# Patient Record
Sex: Female | Born: 1935 | Race: White | Hispanic: No | Marital: Single | Smoking: Never smoker
Health system: Southern US, Community
[De-identification: ages and names within clinical notes are randomized; demographics above are authoritative.]

## PROBLEM LIST (undated history)

## (undated) DIAGNOSIS — I428 Other cardiomyopathies: Secondary | ICD-10-CM

## (undated) DIAGNOSIS — E785 Hyperlipidemia, unspecified: Secondary | ICD-10-CM

## (undated) DIAGNOSIS — I10 Essential (primary) hypertension: Secondary | ICD-10-CM

## (undated) DIAGNOSIS — E46 Unspecified protein-calorie malnutrition: Secondary | ICD-10-CM

## (undated) DIAGNOSIS — I447 Left bundle-branch block, unspecified: Secondary | ICD-10-CM

## (undated) DIAGNOSIS — E039 Hypothyroidism, unspecified: Secondary | ICD-10-CM

## (undated) DIAGNOSIS — I350 Nonrheumatic aortic (valve) stenosis: Secondary | ICD-10-CM

## (undated) DIAGNOSIS — I219 Acute myocardial infarction, unspecified: Secondary | ICD-10-CM

## (undated) HISTORY — DX: Hyperlipidemia, unspecified: E78.5

## (undated) HISTORY — DX: Hypothyroidism, unspecified: E03.9

## (undated) HISTORY — DX: Other cardiomyopathies: I42.8

## (undated) HISTORY — PX: EYE SURGERY: SHX253

## (undated) HISTORY — DX: Nonrheumatic aortic (valve) stenosis: I35.0

## (undated) HISTORY — PX: ABDOMINAL HYSTERECTOMY: SHX81

## (undated) HISTORY — DX: Left bundle-branch block, unspecified: I44.7

## (undated) HISTORY — PX: CHOLECYSTECTOMY: SHX55

---

## 2000-09-08 ENCOUNTER — Other Ambulatory Visit: Admission: RE | Admit: 2000-09-08 | Discharge: 2000-09-08 | Payer: Self-pay | Admitting: Internal Medicine

## 2000-09-09 ENCOUNTER — Encounter: Payer: Self-pay | Admitting: Internal Medicine

## 2000-09-09 ENCOUNTER — Ambulatory Visit (HOSPITAL_COMMUNITY): Admission: RE | Admit: 2000-09-09 | Discharge: 2000-09-09 | Payer: Self-pay | Admitting: Internal Medicine

## 2001-07-08 ENCOUNTER — Emergency Department (HOSPITAL_COMMUNITY): Admission: EM | Admit: 2001-07-08 | Discharge: 2001-07-08 | Payer: Self-pay | Admitting: *Deleted

## 2001-08-10 ENCOUNTER — Ambulatory Visit (HOSPITAL_COMMUNITY): Admission: RE | Admit: 2001-08-10 | Discharge: 2001-08-10 | Payer: Self-pay | Admitting: Internal Medicine

## 2001-08-10 ENCOUNTER — Emergency Department (HOSPITAL_COMMUNITY): Admission: EM | Admit: 2001-08-10 | Discharge: 2001-08-10 | Payer: Self-pay | Admitting: Emergency Medicine

## 2003-11-30 ENCOUNTER — Ambulatory Visit (HOSPITAL_COMMUNITY): Admission: RE | Admit: 2003-11-30 | Discharge: 2003-11-30 | Payer: Self-pay | Admitting: Internal Medicine

## 2005-06-13 ENCOUNTER — Inpatient Hospital Stay (HOSPITAL_COMMUNITY): Admission: EM | Admit: 2005-06-13 | Discharge: 2005-06-18 | Payer: Self-pay | Admitting: Emergency Medicine

## 2007-04-22 ENCOUNTER — Ambulatory Visit (HOSPITAL_COMMUNITY): Admission: RE | Admit: 2007-04-22 | Discharge: 2007-04-22 | Payer: Self-pay | Admitting: Family Medicine

## 2008-09-17 ENCOUNTER — Ambulatory Visit (HOSPITAL_COMMUNITY): Admission: RE | Admit: 2008-09-17 | Discharge: 2008-09-17 | Payer: Self-pay | Admitting: Family Medicine

## 2010-08-08 NOTE — Discharge Summary (Signed)
Brittany Wallace, Brittany Wallace NO.:  [id number]   MEDICAL RECORD NO.:  [id number]          PATIENT TYPE:  INP   LOCATION:  A215                          FACILITY:  [facility]   PHYSICIAN:  Corrie Mckusick, M.D.  DATE OF BIRTH:  07-25-1920   DATE OF ADMISSION:  06/13/2005  DATE OF DISCHARGE:  03/29/2007LH                                 DISCHARGE SUMMARY   DISCHARGE DIAGNOSIS:  Viral gastritis.   For history of present illness and past medical history, please see  admission H&P.   HOSPITAL COURSE:  An 77 year old female with history of hypertension,  coronary artery disease and GERD who presented with gastroenteritis. She was  admitted for IV fluids and aggressive Phenergan and Lomotil treatment for  diarrhea and nausea. She had some hypertension, and she held her atenolol  which she had on admission. She is followed by [facility] quite  closely. She sees Dr. Tresa Endo at [place]. They were consulted here in the  hospital as well. She had had some bradycardia, and she had first-degree  block. Cardiology changed some of her medications, putting her on Toprol,  more of a selective beta blocker, and started on low-dose Diovan.   A CT of the chest was also obtained due to an elevated D-dimer which was  negative for PE. Please see cardiology's notes for details. Potassium was  replaced. She remained stable from a vital standpoint on the new additional  medications. She was ready for discharge on March 29. All of her  gastrointestinal symptoms had resolved quite nicely after a day of  admission.   DISCHARGE PHYSICAL EXAMINATION:  VITAL SIGNS:  Temperature 97.4, blood  pressure 140s/70s, heart rate in the 60s and 70s, respiratory rate 20. O2  saturation 98% on room air.  GENERAL:  Pleasant female in no acute distress.  CHEST:  Clear to auscultation bilaterally.  CARDIOVASCULAR:  Regular rate and rhythm. Normal S1 and S2. No murmurs or  gallops.  ABDOMEN:  Soft,  nontender, nondistended.  EXTREMITIES:  No edema.   DISCHARGE MEDICATIONS:  1.  Diovan hydrochlorothiazide 80/12.5 daily.  2.  Lotrel 10/20 daily.  3.  Toprol-XL 50 mg daily.  4.  Digoxin 0.125 mg daily.  5.  TriCor daily.  6.  Fish oil daily.  7.  Prilosec 20 mg daily.   She is to follow up on Monday with Dr. Nobie Putnam to get stitches out on the  forehead. They will have been in there eight days at that time. I feel like  they need to stay in at least a total of seven days. She will also need a  digoxin level on that day.   DISCHARGE CONDITION:  Improved and stable.      Corrie Mckusick, M.D.  Electronically Signed     JCG/MEDQ  D:  06/18/2005  T:  06/19/2005  Job:  [id number]

## 2010-08-08 NOTE — Procedures (Signed)
NAMELEXIE, KOEHL NO.:  [id number]   MEDICAL RECORD NO.:  [id number]          PATIENT TYPE:  INP   LOCATION:  A215                          FACILITY:  [facility]   PHYSICIAN:  Dani Gobble, MD       DATE OF BIRTH:  05-Nov-1920   DATE OF PROCEDURE:  06/17/2005  DATE OF DISCHARGE:                                  ECHOCARDIOGRAM   REFERRING:  Dr. Phillips Odor and Dr. Domingo Sep.   INDICATIONS:  An 77 year old female with past medical history of  hypertension, left bundle branch block who has been experiencing shortness  of breath and dyspnea on exertion which is new in onset.   The technical quality of this study is adequate.   The aorta measures normally at 2.6 cm.   Left atrium also measures normally at 3.8 cm.  The patient appeared to be in  sinus rhythm during the procedure.   Interventricular septum and posterior wall are within normal limits in  thickness.   The aortic valve is trileaflet and mildly thickened but with normal leaflet  excursion.  Mild aortic insufficiency is appreciated.  Doppler interrogation  of the aortic valve reveals a peak velocity of 2.1 m per second  corresponding to a peak gradient of 70 mmHg and a mean gradient of 12 mmHg  suggestive of very mild aortic stenosis.   The mitral valve appears minimally thickened in the distal portion of the  anterior leaflet but without limitation of leaflet excursion.  Moderate  mitral regurgitation is appreciated as a central jet.  No mitral valve  prolapse is noted.  Doppler interrogation of the mitral valve is within  normal limits.   The pulmonic valve appears grossly structurally normal.   Tricuspid valve also appears grossly structurally normal with mild tricuspid  regurgitation noted.  Pulmonary hypertension is not suggested on this study.   The left ventricle appears to be at the upper limits of normal in size and  given her body habitus is likely mildly dilated.  Overall left ventricular  systolic function is diminished with an estimated ejection fraction of 30 to  40%.  She has multiple regional wall motion abnormalities including a thin  akinetic proximal to mid anterior septal wall.  Interestingly, the distal  anterior septal wall to include the apex thickens normally with systole.  The inferior wall is also akinetic.  Again, this does not include the apex.  The posterior wall appears to thicken normally with systole as does the  lateral wall.  The presence of diastolic dysfunction is inferred from pulse  wave Doppler across the mitral valve.   The right ventricle appears normal in size with normal right systolic  function.  The right atrium may be just mildly dilated.   She has a small posterior pericardial effusion without evidence of  hemodynamic compromise.   IMPRESSION:  1.  Very mild aortic stenosis.  2.  Mild aortic insufficiency.  3.  Moderate mitral regurgitation.  4.  Mild tricuspid regurgitation.  5.  Left ventricle is likely mildly dilated given her body habitus with  diminished ejection fraction estimated at 30 to 40%.  She has multiple      regional wall motion abnormalities as outlined above.  6.  The presence of diastolic dysfunction is inferred from pulse wave      Doppler across the mitral valve.  7.  Probably mild right atrial enlargement.  8.  Small posterior pericardial effusion without evidence of hemodynamic      compromise           ______________________________  Dani Gobble, MD     AB/MEDQ  D:  06/17/2005  T:  06/19/2005  Job:  [id number]

## 2010-08-08 NOTE — H&P (Signed)
NAMESAREN, CORKERN NO.:  [id number]   MEDICAL RECORD NO.:  [id number]          PATIENT TYPE:  INP   LOCATION:  A215                          FACILITY:  [facility]   PHYSICIAN:  Corrie Mckusick, M.D.  DATE OF BIRTH:  [birth year]   DATE OF ADMISSION:  06/13/2005  DATE OF DISCHARGE:  LH                                HISTORY & PHYSICAL   ADMISSION DIAGNOSIS:  Viral gastroenteritis.   HISTORY OF PRESENT ILLNESS:  This is an 77 year old female with history of  hypertension, coronary artery disease and GERD who presents with two to  three days of nausea and vomiting and diarrhea.  She got exposed to  individuals with gastroenteritis and presented to the emergency room not  being able to hold down food for the last seven to eight hours.  She had  watery diarrhea.  She reports no recent antibiotics, no recent travel, no  other symptomatology from a respiratory standpoint whatsoever.   PAST MEDICAL HISTORY:  1.  Hypertension.  2.  Coronary artery disease.  3.  GERD.   PAST SURGICAL HISTORY:  1.  Bilateral cataract surgery.  2.  Total abdominal hysterectomy.  3.  Cholecystectomy.   SOCIAL HISTORY:  Does not drink or smoke.   FAMILY HISTORY:  Coronary artery disease.  No other significant past medical  history.   MEDICATIONS:  1.  Lotrel 10/20 daily.  2.  Lanoxin 0.125 mg daily.  3.  Hydrochlorothiazide 25 mg daily.  4.  Tricor 145 mg daily.  5.  Prilosec 20 mg daily.  6.  Atenolol 50 mg daily.   ALLERGIES:  HYTRIN and CLONIDINE.   PHYSICAL EXAMINATION:  VITAL SIGNS: Temperature 99, blood pressure 135/65,  pulse 82, respirations 20.  GENERAL:  Pleasant, talkative, in no acute distress.  HEENT:  Nasopharynx clear with moist mucous membranes.  NECK:  Supple. No lymphadenopathy or thyromegaly.  CHEST:  Clear to auscultation bilaterally.  CARDIOVASCULAR:  Normal S1 and S2.  No murmurs, gallops or rubs.  ABDOMEN:  Bowel sounds hyperactive.  There is just some mild  mid epigastric  and periumbilical tenderness.  No rebound.  No guarding.  EXTREMITIES:  No edema.   LABORATORY DATA:  White count 8.7, hemoglobin 14.1, hematocrit 40.8,  platelets 267.  Sodium 139, potassium 4.1, chloride 104, bicarb 24, BUN 23,  creatinine 0.9, AST 43, ALT 37, digoxin 0.5, lipase 45.  Urinalysis  negative.   ASSESSMENT/PLAN:  An 77 year old female with history of hypertension,  coronary artery disease and gastroesophageal reflux disease who presented  with viral gastroenteritis.   PLAN:  1.  Admit for IV fluids.  2.  Phenergan and Lomotil for diarrhea and nausea.  3.  Hold her atenolol due to mild hypotension.  We will go ahead and      continue Lotrel for now.  4.  Will continue to follow closely.      Corrie Mckusick, M.D.  Electronically Signed     JCG/MEDQ  D:  06/14/2005  T:  06/16/2005  Job:  [id number]

## 2011-07-28 DIAGNOSIS — J019 Acute sinusitis, unspecified: Secondary | ICD-10-CM | POA: Diagnosis not present

## 2011-07-28 DIAGNOSIS — I10 Essential (primary) hypertension: Secondary | ICD-10-CM | POA: Diagnosis not present

## 2011-07-28 DIAGNOSIS — E785 Hyperlipidemia, unspecified: Secondary | ICD-10-CM | POA: Diagnosis not present

## 2011-07-28 DIAGNOSIS — IMO0002 Reserved for concepts with insufficient information to code with codable children: Secondary | ICD-10-CM | POA: Diagnosis not present

## 2011-07-28 DIAGNOSIS — R609 Edema, unspecified: Secondary | ICD-10-CM | POA: Diagnosis not present

## 2011-07-31 DIAGNOSIS — E785 Hyperlipidemia, unspecified: Secondary | ICD-10-CM | POA: Diagnosis not present

## 2011-07-31 DIAGNOSIS — I10 Essential (primary) hypertension: Secondary | ICD-10-CM | POA: Diagnosis not present

## 2011-08-12 DIAGNOSIS — H409 Unspecified glaucoma: Secondary | ICD-10-CM | POA: Diagnosis not present

## 2011-08-12 DIAGNOSIS — Z961 Presence of intraocular lens: Secondary | ICD-10-CM | POA: Diagnosis not present

## 2011-08-12 DIAGNOSIS — H4011X0 Primary open-angle glaucoma, stage unspecified: Secondary | ICD-10-CM | POA: Diagnosis not present

## 2011-10-20 DIAGNOSIS — I10 Essential (primary) hypertension: Secondary | ICD-10-CM | POA: Diagnosis not present

## 2011-10-20 DIAGNOSIS — IMO0002 Reserved for concepts with insufficient information to code with codable children: Secondary | ICD-10-CM | POA: Diagnosis not present

## 2011-10-20 DIAGNOSIS — H6690 Otitis media, unspecified, unspecified ear: Secondary | ICD-10-CM | POA: Diagnosis not present

## 2011-10-27 DIAGNOSIS — H8309 Labyrinthitis, unspecified ear: Secondary | ICD-10-CM | POA: Diagnosis not present

## 2011-10-27 DIAGNOSIS — H6500 Acute serous otitis media, unspecified ear: Secondary | ICD-10-CM | POA: Diagnosis not present

## 2011-10-27 DIAGNOSIS — IMO0002 Reserved for concepts with insufficient information to code with codable children: Secondary | ICD-10-CM | POA: Diagnosis not present

## 2011-11-04 DIAGNOSIS — J370 Chronic laryngitis: Secondary | ICD-10-CM | POA: Diagnosis not present

## 2011-11-04 DIAGNOSIS — K219 Gastro-esophageal reflux disease without esophagitis: Secondary | ICD-10-CM | POA: Diagnosis not present

## 2011-11-04 DIAGNOSIS — J309 Allergic rhinitis, unspecified: Secondary | ICD-10-CM | POA: Diagnosis not present

## 2011-11-17 DIAGNOSIS — I119 Hypertensive heart disease without heart failure: Secondary | ICD-10-CM | POA: Diagnosis not present

## 2011-11-17 DIAGNOSIS — I428 Other cardiomyopathies: Secondary | ICD-10-CM | POA: Diagnosis not present

## 2011-11-17 DIAGNOSIS — I447 Left bundle-branch block, unspecified: Secondary | ICD-10-CM | POA: Diagnosis not present

## 2011-11-18 DIAGNOSIS — J370 Chronic laryngitis: Secondary | ICD-10-CM | POA: Diagnosis not present

## 2011-11-18 DIAGNOSIS — J309 Allergic rhinitis, unspecified: Secondary | ICD-10-CM | POA: Diagnosis not present

## 2012-01-13 DIAGNOSIS — Z23 Encounter for immunization: Secondary | ICD-10-CM | POA: Diagnosis not present

## 2012-01-20 DIAGNOSIS — Z961 Presence of intraocular lens: Secondary | ICD-10-CM | POA: Diagnosis not present

## 2012-01-20 DIAGNOSIS — H4011X0 Primary open-angle glaucoma, stage unspecified: Secondary | ICD-10-CM | POA: Diagnosis not present

## 2012-01-20 DIAGNOSIS — H409 Unspecified glaucoma: Secondary | ICD-10-CM | POA: Diagnosis not present

## 2012-01-20 DIAGNOSIS — H3530 Unspecified macular degeneration: Secondary | ICD-10-CM | POA: Diagnosis not present

## 2012-02-06 DIAGNOSIS — IMO0002 Reserved for concepts with insufficient information to code with codable children: Secondary | ICD-10-CM | POA: Diagnosis not present

## 2012-02-06 DIAGNOSIS — E785 Hyperlipidemia, unspecified: Secondary | ICD-10-CM | POA: Diagnosis not present

## 2012-02-06 DIAGNOSIS — T148XXA Other injury of unspecified body region, initial encounter: Secondary | ICD-10-CM | POA: Diagnosis not present

## 2012-02-06 DIAGNOSIS — I10 Essential (primary) hypertension: Secondary | ICD-10-CM | POA: Diagnosis not present

## 2012-04-05 DIAGNOSIS — H905 Unspecified sensorineural hearing loss: Secondary | ICD-10-CM | POA: Diagnosis not present

## 2012-04-05 DIAGNOSIS — R42 Dizziness and giddiness: Secondary | ICD-10-CM | POA: Diagnosis not present

## 2012-04-05 DIAGNOSIS — R269 Unspecified abnormalities of gait and mobility: Secondary | ICD-10-CM | POA: Diagnosis not present

## 2012-04-05 DIAGNOSIS — K219 Gastro-esophageal reflux disease without esophagitis: Secondary | ICD-10-CM | POA: Diagnosis not present

## 2012-04-08 DIAGNOSIS — R269 Unspecified abnormalities of gait and mobility: Secondary | ICD-10-CM | POA: Diagnosis not present

## 2012-04-08 DIAGNOSIS — I10 Essential (primary) hypertension: Secondary | ICD-10-CM | POA: Diagnosis not present

## 2012-04-08 DIAGNOSIS — IMO0001 Reserved for inherently not codable concepts without codable children: Secondary | ICD-10-CM | POA: Diagnosis not present

## 2012-04-12 DIAGNOSIS — IMO0001 Reserved for inherently not codable concepts without codable children: Secondary | ICD-10-CM | POA: Diagnosis not present

## 2012-04-12 DIAGNOSIS — I10 Essential (primary) hypertension: Secondary | ICD-10-CM | POA: Diagnosis not present

## 2012-04-12 DIAGNOSIS — R269 Unspecified abnormalities of gait and mobility: Secondary | ICD-10-CM | POA: Diagnosis not present

## 2012-05-02 DIAGNOSIS — I10 Essential (primary) hypertension: Secondary | ICD-10-CM | POA: Diagnosis not present

## 2012-05-02 DIAGNOSIS — IMO0001 Reserved for inherently not codable concepts without codable children: Secondary | ICD-10-CM | POA: Diagnosis not present

## 2012-05-02 DIAGNOSIS — R269 Unspecified abnormalities of gait and mobility: Secondary | ICD-10-CM | POA: Diagnosis not present

## 2012-05-18 DIAGNOSIS — I10 Essential (primary) hypertension: Secondary | ICD-10-CM | POA: Diagnosis not present

## 2012-05-18 DIAGNOSIS — IMO0001 Reserved for inherently not codable concepts without codable children: Secondary | ICD-10-CM | POA: Diagnosis not present

## 2012-07-05 DIAGNOSIS — R42 Dizziness and giddiness: Secondary | ICD-10-CM | POA: Diagnosis not present

## 2012-07-05 DIAGNOSIS — I447 Left bundle-branch block, unspecified: Secondary | ICD-10-CM | POA: Diagnosis not present

## 2012-07-05 DIAGNOSIS — I359 Nonrheumatic aortic valve disorder, unspecified: Secondary | ICD-10-CM | POA: Diagnosis not present

## 2012-07-05 DIAGNOSIS — I428 Other cardiomyopathies: Secondary | ICD-10-CM | POA: Diagnosis not present

## 2012-07-06 DIAGNOSIS — T462X1A Poisoning by other antidysrhythmic drugs, accidental (unintentional), initial encounter: Secondary | ICD-10-CM | POA: Diagnosis not present

## 2012-07-06 DIAGNOSIS — E782 Mixed hyperlipidemia: Secondary | ICD-10-CM | POA: Diagnosis not present

## 2012-07-06 DIAGNOSIS — Z79899 Other long term (current) drug therapy: Secondary | ICD-10-CM | POA: Diagnosis not present

## 2012-07-07 ENCOUNTER — Encounter (HOSPITAL_COMMUNITY): Payer: Self-pay | Admitting: *Deleted

## 2012-07-07 ENCOUNTER — Emergency Department (HOSPITAL_COMMUNITY)
Admission: EM | Admit: 2012-07-07 | Discharge: 2012-07-07 | Disposition: A | Discharge status: Home / Self Care | Payer: Medicare Other | Attending: Emergency Medicine | Admitting: Emergency Medicine

## 2012-07-07 ENCOUNTER — Emergency Department (HOSPITAL_COMMUNITY): Payer: Medicare Other

## 2012-07-07 ENCOUNTER — Other Ambulatory Visit (HOSPITAL_COMMUNITY): Payer: Self-pay | Admitting: Cardiovascular Disease

## 2012-07-07 DIAGNOSIS — I255 Ischemic cardiomyopathy: Secondary | ICD-10-CM

## 2012-07-07 DIAGNOSIS — Z79899 Other long term (current) drug therapy: Secondary | ICD-10-CM | POA: Diagnosis not present

## 2012-07-07 DIAGNOSIS — R0602 Shortness of breath: Secondary | ICD-10-CM | POA: Diagnosis not present

## 2012-07-07 DIAGNOSIS — J209 Acute bronchitis, unspecified: Secondary | ICD-10-CM | POA: Insufficient documentation

## 2012-07-07 DIAGNOSIS — I252 Old myocardial infarction: Secondary | ICD-10-CM | POA: Diagnosis not present

## 2012-07-07 DIAGNOSIS — R5381 Other malaise: Secondary | ICD-10-CM | POA: Diagnosis not present

## 2012-07-07 DIAGNOSIS — R112 Nausea with vomiting, unspecified: Secondary | ICD-10-CM | POA: Insufficient documentation

## 2012-07-07 DIAGNOSIS — I447 Left bundle-branch block, unspecified: Secondary | ICD-10-CM | POA: Diagnosis not present

## 2012-07-07 DIAGNOSIS — R05 Cough: Secondary | ICD-10-CM | POA: Insufficient documentation

## 2012-07-07 DIAGNOSIS — J40 Bronchitis, not specified as acute or chronic: Secondary | ICD-10-CM

## 2012-07-07 DIAGNOSIS — R5383 Other fatigue: Secondary | ICD-10-CM | POA: Insufficient documentation

## 2012-07-07 DIAGNOSIS — R42 Dizziness and giddiness: Secondary | ICD-10-CM | POA: Diagnosis not present

## 2012-07-07 DIAGNOSIS — R011 Cardiac murmur, unspecified: Secondary | ICD-10-CM

## 2012-07-07 DIAGNOSIS — R350 Frequency of micturition: Secondary | ICD-10-CM | POA: Insufficient documentation

## 2012-07-07 DIAGNOSIS — R059 Cough, unspecified: Secondary | ICD-10-CM | POA: Insufficient documentation

## 2012-07-07 DIAGNOSIS — I10 Essential (primary) hypertension: Secondary | ICD-10-CM | POA: Insufficient documentation

## 2012-07-07 DIAGNOSIS — I350 Nonrheumatic aortic (valve) stenosis: Secondary | ICD-10-CM

## 2012-07-07 DIAGNOSIS — R531 Weakness: Secondary | ICD-10-CM

## 2012-07-07 HISTORY — DX: Essential (primary) hypertension: I10

## 2012-07-07 HISTORY — DX: Acute myocardial infarction, unspecified: I21.9

## 2012-07-07 LAB — URINALYSIS, ROUTINE W REFLEX MICROSCOPIC
Bilirubin Urine: NEGATIVE
Glucose, UA: NEGATIVE mg/dL
Ketones, ur: NEGATIVE mg/dL
Leukocytes, UA: NEGATIVE
Nitrite: NEGATIVE
Specific Gravity, Urine: <1.005 — ABNORMAL LOW (ref 1.005–1.030)
pH: 7 (ref 5.0–8.0)

## 2012-07-07 LAB — COMPREHENSIVE METABOLIC PANEL
Albumin: 4.2 g/dL (ref 3.5–5.2)
Alkaline Phosphatase: 48 U/L (ref 39–117)
BUN: 20 mg/dL (ref 6–23)
CO2: 23 mEq/L (ref 19–32)
Chloride: 96 mEq/L (ref 96–112)
Creatinine, Ser: 0.67 mg/dL (ref 0.50–1.10)
GFR calc Af Amer: 87 mL/min — ABNORMAL LOW (ref >90)
GFR calc non Af Amer: 75 mL/min — ABNORMAL LOW (ref >90)
Glucose, Bld: 109 mg/dL — ABNORMAL HIGH (ref 70–99)
Potassium: 3.9 mEq/L (ref 3.5–5.1)
Total Bilirubin: 0.6 mg/dL (ref 0.3–1.2)

## 2012-07-07 LAB — CBC WITH DIFFERENTIAL/PLATELET
HCT: 36 % (ref 36.0–46.0)
Hemoglobin: 13 g/dL (ref 12.0–15.0)
Lymphs Abs: 1.4 10*3/uL (ref 0.7–4.0)
Monocytes Absolute: 0.4 10*3/uL (ref 0.1–1.0)
Monocytes Relative: 9 % (ref 3–12)
Neutro Abs: 2.4 10*3/uL (ref 1.7–7.7)
Neutrophils Relative %: 51 % (ref 43–77)
RBC: 4.08 MIL/uL (ref 3.87–5.11)

## 2012-07-07 LAB — DIGOXIN LEVEL: Digoxin Level: 0.4 ng/mL — ABNORMAL LOW (ref 0.8–2.0)

## 2012-07-07 MED ORDER — LEVOFLOXACIN 500 MG PO TABS: 500.0000 mg | ORAL_TABLET | Freq: Every day | ORAL | Status: DC

## 2012-07-07 NOTE — ED Notes (Signed)
Dizziness, onset on Monday, Seen by Dr Tresa Endo on  Tuesday.  Frequency of urination.  Changed her atenolol to 1/2 her usual dose.  No pain.

## 2012-07-07 NOTE — ED Notes (Signed)
100 CC of urine showed up on Bladder Scanner.

## 2012-07-07 NOTE — ED Provider Notes (Signed)
History     CSN: [id number]  Arrival date & time 07/07/12  1733   First MD Initiated Contact with Patient 07/07/12 1919      Chief Complaint  Patient presents with  . Dizziness    (Consider location/radiation/quality/duration/timing/severity/associated sxs/prior treatment) HPI  Patient reports 3 days ago she started having a feeling that she was going to pass out when she stood up while working. She states she laid down for about half hour and feels better. She has frequency without dysuria. She states while lying in bed the urine just seeps out without control. She has had mild nausea without vomiting or diarrhea. She denies constipation. She states she's had a hacking cough for 2-3 weeks without fever. She states her appetite is normal. She denies any diaphoresis but states sometimes with these episodes she feels short of breath but she denies chest pain. She denies headache or a feeling of spinning. She states she also had an episode of this about 2 weeks ago. She states she was seen by her cardiologist, Dr. Tresa Endo 2 days ago and he decreased her Tenormin by half. She is scheduled to get a outpatient echo done soon. She reports she has felt like she may be dehydrated and has been drinking a lot of extra water. She reports about 15 minutes after she drinks water she has to urinate.  PCP Dr. Regino Schultze Cardiology Dr Tresa Endo  Past Medical History  Diagnosis Date  . Hypertension   . Myocardial infarct     Past Surgical History  Procedure Laterality Date  . Abdominal hysterectomy    . Cholecystectomy    . Eye surgery      History reviewed. No pertinent family history.  History  Substance Use Topics  . Smoking status: Never Smoker   . Smokeless tobacco: Not on file  . Alcohol Use: No  lives at home Lives alone  [place] History   Grav Para Term Preterm Abortions TAB SAB Ect Mult Living                  Review of Systems  All other systems reviewed and are  negative.    Allergies  Review of patient's allergies indicates no known allergies.  Home Medications   Current Outpatient Rx  Name  Route  Sig  Dispense  Refill  . amLODipine-benazepril (LOTREL) 10-40 MG per capsule   Oral   Take 1 capsule by mouth daily.         Marland Kitchen aspirin EC 81 MG tablet   Oral   Take 81 mg by mouth daily.         Marland Kitchen atenolol (TENORMIN) 25 MG tablet   Oral   Take 12.5 mg by mouth daily.         . Calcium Citrate-Vitamin D (CITRACAL + D PO)   Oral   Take 1 tablet by mouth daily.         . chlordiazePOXIDE (LIBRIUM) 10 MG capsule   Oral   Take 10 mg by mouth 3 (three) times daily as needed for anxiety.         . Choline Fenofibrate 135 MG capsule   Oral   Take 135 mg by mouth daily.         . digoxin (LANOXIN) 0.125 MG tablet   Oral   Take 0.25 mg by mouth daily.         . diphenoxylate-atropine (LOMOTIL) 2.5-0.025 MG per tablet   Oral   Take 1 tablet  by mouth 4 (four) times daily as needed for diarrhea or loose stools.         . hydrochlorothiazide (HYDRODIURIL) 25 MG tablet   Oral   Take 12.5 mg by mouth daily.         . Lutein 20 MG TABS   Oral   Take 1 tablet by mouth daily.         . montelukast (SINGULAIR) 10 MG tablet   Oral   Take 10 mg by mouth at bedtime.         . Multiple Vitamins-Minerals (CENTRUM SILVER) tablet   Oral   Take 1 tablet by mouth daily.         Marland Kitchen omeprazole (PRILOSEC) 20 MG capsule   Oral   Take 20 mg by mouth daily.         . potassium chloride (KLOR-CON) 8 MEQ tablet   Oral   Take 16 mEq by mouth daily.         . timolol (TIMOPTIC) 0.5 % ophthalmic solution   Both Eyes   Place 1 drop into both eyes 2 (two) times daily.           BP 184/70  Pulse 75  Temp(Src) 98 F (36.7 C) (Oral)  Resp 20  Ht 5\' 4"  (1.626 m)  Wt 115 lb (52.164 kg)  BMI 19.73 kg/m2  SpO2 99%  Vital signs normal except hypertension   Physical Exam  Nursing note and vitals  reviewed. Constitutional: She is oriented to person, place, and time. She appears well-developed and well-nourished.  Non-toxic appearance. She does not appear ill. No distress.  HENT:  Head: Normocephalic and atraumatic.  Right Ear: External ear normal.  Left Ear: External ear normal.  Nose: Nose normal. No mucosal edema or rhinorrhea.  Mouth/Throat: Oropharynx is clear and moist and mucous membranes are normal. No dental abscesses or edematous.  Eyes: Conjunctivae and EOM are normal.  Left pupil is abnormal and is vertically oriented from prior eye surgery  Neck: Normal range of motion and full passive range of motion without pain. Neck supple.  Cardiovascular: Normal rate, regular rhythm and normal heart sounds.  Exam reveals no gallop and no friction rub.   No murmur heard. Pulmonary/Chest: Effort normal and breath sounds normal. No respiratory distress. She has no wheezes. She has no rhonchi. She has no rales. She exhibits no tenderness and no crepitus.  Abdominal: Soft. Normal appearance and bowel sounds are normal. She exhibits no distension. There is no tenderness. There is no rebound and no guarding.  Musculoskeletal: Normal range of motion. She exhibits no edema and no tenderness.  Moves all extremities well.   Neurological: She is alert and oriented to person, place, and time. She has normal strength. No cranial nerve deficit.  Skin: Skin is warm, dry and intact. No rash noted. No erythema. No pallor.  Psychiatric: She has a normal mood and affect. Her speech is normal and behavior is normal. Her mood appears not anxious.    ED Course  Procedures (including critical care time)  Patient ambulated to the bathroom with minimal assistance 2-3 times while waiting for her laboratory results to return.  Bladder scan shows 100 cc of urine after voiding, no urinary retention present.   We discussed that she should either barrow or buy  a blood pressure monitor and when she has the  episodes when she feels like she's going to pass out she should check her blood pressure and write it  down so she can show her physicians if her blood pressure is going up too high or dropping too low when she feels near syncopal.    Results for orders placed during the hospital encounter of 07/07/12  URINALYSIS, ROUTINE W REFLEX MICROSCOPIC      Result Value Range   Color, Urine YELLOW  YELLOW   APPearance CLEAR  CLEAR   Specific Gravity, Urine <1.005 (*) 1.005 - 1.030   pH 7.0  5.0 - 8.0   Glucose, UA NEGATIVE  NEGATIVE mg/dL   Hgb urine dipstick NEGATIVE  NEGATIVE   Bilirubin Urine NEGATIVE  NEGATIVE   Ketones, ur NEGATIVE  NEGATIVE mg/dL   Protein, ur NEGATIVE  NEGATIVE mg/dL   Urobilinogen, UA 0.2  0.0 - 1.0 mg/dL   Nitrite NEGATIVE  NEGATIVE   Leukocytes, UA NEGATIVE  NEGATIVE  CBC WITH DIFFERENTIAL      Result Value Range   WBC 4.7  4.0 - 10.5 K/uL   RBC 4.08  3.87 - 5.11 MIL/uL   Hemoglobin 13.0  12.0 - 15.0 g/dL   HCT 11.9  14.7 - 82.9 %   MCV 88.2  78.0 - 100.0 fL   MCH 31.9  26.0 - 34.0 pg   MCHC 36.1 (*) 30.0 - 36.0 g/dL   RDW 56.2  13.0 - 86.5 %   Platelets 241  150 - 400 K/uL   Neutrophils Relative 51  43 - 77 %   Neutro Abs 2.4  1.7 - 7.7 K/uL   Lymphocytes Relative 31  12 - 46 %   Lymphs Abs 1.4  0.7 - 4.0 K/uL   Monocytes Relative 9  3 - 12 %   Monocytes Absolute 0.4  0.1 - 1.0 K/uL   Eosinophils Relative 8 (*) 0 - 5 %   Eosinophils Absolute 0.4  0.0 - 0.7 K/uL   Basophils Relative 1  0 - 1 %   Basophils Absolute 0.0  0.0 - 0.1 K/uL  COMPREHENSIVE METABOLIC PANEL      Result Value Range   Sodium 131 (*) 135 - 145 mEq/L   Potassium 3.9  3.5 - 5.1 mEq/L   Chloride 96  96 - 112 mEq/L   CO2 23  19 - 32 mEq/L   Glucose, Bld 109 (*) 70 - 99 mg/dL   BUN 20  6 - 23 mg/dL   Creatinine, Ser 7.84  0.50 - 1.10 mg/dL   Calcium 9.7  8.4 - 69.6 mg/dL   Total Protein 7.8  6.0 - 8.3 g/dL   Albumin 4.2  3.5 - 5.2 g/dL   AST 21  0 - 37 U/L   ALT 20  0 - 35 U/L    Alkaline Phosphatase 48  39 - 117 U/L   Total Bilirubin 0.6  0.3 - 1.2 mg/dL   GFR calc non Af Amer 75 (*) >90 mL/min   GFR calc Af Amer 87 (*) >90 mL/min  DIGOXIN LEVEL      Result Value Range   Digoxin Level 0.4 (*) 0.8 - 2.0 ng/mL   Vital signs normal except subtherapeutic Digoxin level, hyponatremia, dilute urine.    Dg Chest Portable 1 View  07/07/2012  *RADIOLOGY REPORT*  Clinical Data: Cough and shortness of breath.  PORTABLE CHEST - 1 VIEW  Comparison: Chest radiograph performed 04/22/2007  Findings: The lungs are well-aerated.  Chronically increased interstitial markings are seen.  Minimal right basilar airspace opacity could reflect atelectasis or mild pneumonia.  No pleural effusion or pneumothorax  is seen.  The cardiomediastinal silhouette is mildly enlarged.  There is stable aneurysmal dilatation at the proximal descending thoracic aorta.  This was seen to measure 4.1 cm on the prior CT from 2007, and is stable from the prior chest radiograph in 2007.  No acute osseous abnormalities are seen.  IMPRESSION:  1.  Minimal right basilar airspace opacity could reflect atelectasis or mild pneumonia.  Mild chronic lung changes seen. 2.  Mild cardiomegaly; stable aneurysmal dilatation of the proximal descending thoracic aorta, unchanged from 2007.   Original Report Authenticated By: Tonia Ghent, M.D.      Date: 07/07/2012  Rate: 67  Rhythm: normal sinus rhythm  QRS Axis: left  Intervals: normal  ST/T Wave abnormalities: normal  Conduction Disutrbances:left bundle branch block  Narrative Interpretation:   Old EKG Reviewed: none available    1. Weakness   2. Bronchitis     New Prescriptions   LEVOFLOXACIN (LEVAQUIN) 500 MG TABLET    Take 1 tablet (500 mg total) by mouth daily.    Plan discharge  Devoria Albe, MD, FACEP   MDM          Ward Givens, MD 07/07/12 2226

## 2012-07-19 DIAGNOSIS — E785 Hyperlipidemia, unspecified: Secondary | ICD-10-CM | POA: Diagnosis not present

## 2012-07-19 DIAGNOSIS — I10 Essential (primary) hypertension: Secondary | ICD-10-CM | POA: Diagnosis not present

## 2012-07-19 DIAGNOSIS — R5383 Other fatigue: Secondary | ICD-10-CM | POA: Diagnosis not present

## 2012-07-19 DIAGNOSIS — R5381 Other malaise: Secondary | ICD-10-CM | POA: Diagnosis not present

## 2012-07-19 DIAGNOSIS — I2510 Atherosclerotic heart disease of native coronary artery without angina pectoris: Secondary | ICD-10-CM | POA: Diagnosis not present

## 2012-07-20 ENCOUNTER — Ambulatory Visit (HOSPITAL_COMMUNITY)
Admission: RE | Admit: 2012-07-20 | Discharge: 2012-07-20 | Disposition: A | Discharge status: Home / Self Care | Payer: Medicare Other | Source: Ambulatory Visit | Attending: Cardiovascular Disease | Admitting: Cardiovascular Disease

## 2012-07-20 DIAGNOSIS — I2589 Other forms of chronic ischemic heart disease: Secondary | ICD-10-CM | POA: Diagnosis not present

## 2012-07-20 DIAGNOSIS — I359 Nonrheumatic aortic valve disorder, unspecified: Secondary | ICD-10-CM | POA: Diagnosis not present

## 2012-07-20 DIAGNOSIS — I428 Other cardiomyopathies: Secondary | ICD-10-CM | POA: Diagnosis not present

## 2012-07-20 DIAGNOSIS — R011 Cardiac murmur, unspecified: Secondary | ICD-10-CM | POA: Diagnosis not present

## 2012-07-20 DIAGNOSIS — I255 Ischemic cardiomyopathy: Secondary | ICD-10-CM

## 2012-07-20 DIAGNOSIS — I350 Nonrheumatic aortic (valve) stenosis: Secondary | ICD-10-CM

## 2012-07-20 NOTE — Progress Notes (Signed)
[facility]   2D echo completed 07/20/2012.   Cindy Marlicia Sroka, RDCS  

## 2012-07-21 ENCOUNTER — Ambulatory Visit (HOSPITAL_COMMUNITY): Payer: Self-pay

## 2012-07-22 DIAGNOSIS — H3530 Unspecified macular degeneration: Secondary | ICD-10-CM | POA: Diagnosis not present

## 2012-07-22 DIAGNOSIS — H4011X0 Primary open-angle glaucoma, stage unspecified: Secondary | ICD-10-CM | POA: Diagnosis not present

## 2012-07-22 DIAGNOSIS — Z961 Presence of intraocular lens: Secondary | ICD-10-CM | POA: Diagnosis not present

## 2012-08-18 ENCOUNTER — Other Ambulatory Visit: Payer: Self-pay | Admitting: *Deleted

## 2012-08-18 MED ORDER — POTASSIUM CHLORIDE ER 8 MEQ PO TBCR: 16.0000 meq | EXTENDED_RELEASE_TABLET | Freq: Every day | ORAL | Status: DC

## 2012-09-16 ENCOUNTER — Other Ambulatory Visit: Payer: Self-pay | Admitting: *Deleted

## 2012-09-16 MED ORDER — AMLODIPINE BESY-BENAZEPRIL HCL 10-40 MG PO CAPS: 1.0000 | ORAL_CAPSULE | Freq: Every day | ORAL | Status: DC

## 2012-11-08 DIAGNOSIS — Z681 Body mass index (BMI) 19 or less, adult: Secondary | ICD-10-CM | POA: Diagnosis not present

## 2012-11-08 DIAGNOSIS — H8309 Labyrinthitis, unspecified ear: Secondary | ICD-10-CM | POA: Diagnosis not present

## 2012-11-08 DIAGNOSIS — I10 Essential (primary) hypertension: Secondary | ICD-10-CM | POA: Diagnosis not present

## 2012-11-16 ENCOUNTER — Other Ambulatory Visit: Payer: Self-pay | Admitting: *Deleted

## 2012-11-16 MED ORDER — DIGOXIN 125 MCG PO TABS: 0.0625 mg | ORAL_TABLET | Freq: Every day | ORAL | Status: DC

## 2012-11-16 NOTE — Telephone Encounter (Signed)
Rx was sent to pharmacy electronically. 

## 2012-11-23 DIAGNOSIS — H93299 Other abnormal auditory perceptions, unspecified ear: Secondary | ICD-10-CM | POA: Diagnosis not present

## 2012-11-23 DIAGNOSIS — H8110 Benign paroxysmal vertigo, unspecified ear: Secondary | ICD-10-CM | POA: Diagnosis not present

## 2012-11-23 DIAGNOSIS — J370 Chronic laryngitis: Secondary | ICD-10-CM | POA: Diagnosis not present

## 2012-12-15 ENCOUNTER — Other Ambulatory Visit: Payer: Self-pay | Admitting: *Deleted

## 2012-12-15 MED ORDER — CHOLINE FENOFIBRATE 135 MG PO CPDR: 135.0000 mg | DELAYED_RELEASE_CAPSULE | Freq: Every day | ORAL | Status: DC

## 2012-12-15 NOTE — Telephone Encounter (Signed)
Rx was sent to pharmacy electronically. 

## 2012-12-16 ENCOUNTER — Other Ambulatory Visit: Payer: Self-pay | Admitting: *Deleted

## 2012-12-16 MED ORDER — HYDROCHLOROTHIAZIDE 25 MG PO TABS: 12.5000 mg | ORAL_TABLET | Freq: Every day | ORAL | Status: DC

## 2012-12-16 NOTE — Telephone Encounter (Signed)
Rx was sent to pharmacy electronically. 

## 2012-12-27 DIAGNOSIS — K219 Gastro-esophageal reflux disease without esophagitis: Secondary | ICD-10-CM | POA: Diagnosis not present

## 2012-12-27 DIAGNOSIS — J309 Allergic rhinitis, unspecified: Secondary | ICD-10-CM | POA: Diagnosis not present

## 2012-12-27 DIAGNOSIS — J370 Chronic laryngitis: Secondary | ICD-10-CM | POA: Diagnosis not present

## 2012-12-28 ENCOUNTER — Encounter: Payer: Self-pay | Admitting: Cardiovascular Disease

## 2012-12-28 ENCOUNTER — Ambulatory Visit (INDEPENDENT_AMBULATORY_CARE_PROVIDER_SITE_OTHER): Payer: Medicare Other | Admitting: Cardiovascular Disease

## 2012-12-28 VITALS — BP 164/70 | HR 70 | Ht 64.0 in | Wt 120.3 lb

## 2012-12-28 DIAGNOSIS — I447 Left bundle-branch block, unspecified: Secondary | ICD-10-CM

## 2012-12-28 DIAGNOSIS — E785 Hyperlipidemia, unspecified: Secondary | ICD-10-CM | POA: Diagnosis not present

## 2012-12-28 DIAGNOSIS — I10 Essential (primary) hypertension: Secondary | ICD-10-CM | POA: Diagnosis not present

## 2012-12-28 DIAGNOSIS — I2510 Atherosclerotic heart disease of native coronary artery without angina pectoris: Secondary | ICD-10-CM

## 2012-12-28 DIAGNOSIS — I428 Other cardiomyopathies: Secondary | ICD-10-CM | POA: Diagnosis not present

## 2012-12-28 MED ORDER — ATENOLOL 25 MG PO TABS: 12.5000 mg | ORAL_TABLET | Freq: Two times a day (BID) | ORAL | Status: DC

## 2012-12-28 NOTE — Progress Notes (Signed)
Patient ID: Brittany Wallace, female   DOB: [birth year], 79 y.o.   MRN: [id number]       HPI: Brittany Wallace, is a 79 y.o. female who presents to the office for a six-month cardiology evaluation.  Brittany Wallace is now 79 years old. She remains active and still cuts her grass. She has a remote history of a nonischemic cardiac myopathy and in 2001 her ejection fraction was 30%. This subsequently has increased. On 07/20/2012 echo Doppler study showed an ejection fraction now in the range of 40-45%. She had grade 1 diastolic dysfunction. She had mild/moderate pulmonary hypertension with a PA pressure of 48 mm, moderate tricuspid regurgitation, moderate mitral regurgitation with mitral annular calcification. She also has a history of left bundle branch block, hypertension, as well as hyperlipidemia.  Over the past 6 months, she has noticed some slight increase in palpitations. Times her blood pressure has been elevated. She presents for evaluation.  Past Medical History  Diagnosis Date  . Hypertension   . Myocardial infarct     Past Surgical History  Procedure Laterality Date  . Abdominal hysterectomy    . Cholecystectomy    . Eye surgery      Allergies  Allergen Reactions  . Aspirin     High doses.  [place] [Levofloxacin In D5w]     Fuzzy headed  . Pantoprazole     Current Outpatient Prescriptions  Medication Sig Dispense Refill  . amLODipine-benazepril (LOTREL) 10-40 MG per capsule Take 1 capsule by mouth daily.  30 capsule  11  . atenolol (TENORMIN) 25 MG tablet Take 0.5 tablets (12.5 mg total) by mouth 2 (two) times daily.  60 tablet  5  . Calcium Citrate-Vitamin D (CITRACAL + D PO) Take 1 tablet by mouth daily.      . chlordiazePOXIDE (LIBRIUM) 10 MG capsule Take 10 mg by mouth 3 (three) times daily as needed for anxiety.      . Choline Fenofibrate 135 MG capsule Take 1 capsule (135 mg total) by mouth daily.  30 capsule  7  . digoxin (LANOXIN) 0.125 MG tablet Take 0.5 tablets (0.0625 mg  total) by mouth daily.  15 tablet  9  . diphenoxylate-atropine (LOMOTIL) 2.5-0.025 MG per tablet Take 1 tablet by mouth 4 (four) times daily as needed for diarrhea or loose stools.      . hydrochlorothiazide (HYDRODIURIL) 25 MG tablet Take 0.5 tablets (12.5 mg total) by mouth daily.  30 tablet  3  . lansoprazole (PREVACID) 30 MG capsule Take 30 mg by mouth daily.      . Lutein 20 MG TABS Take 1 tablet by mouth daily.      . montelukast (SINGULAIR) 10 MG tablet Take 10 mg by mouth at bedtime.      . Multiple Vitamins-Minerals (CENTRUM SILVER) tablet Take 1 tablet by mouth daily.      . potassium chloride (KLOR-CON) 8 MEQ tablet Take 2 tablets (16 mEq total) by mouth daily.  60 tablet  6  . timolol (TIMOPTIC) 0.5 % ophthalmic solution Place 1 drop into both eyes 2 (two) times daily.      Marland Kitchen aspirin EC 81 MG tablet Take 81 mg by mouth daily.      . meclizine (ANTIVERT) 25 MG tablet Take 25 mg by mouth as needed.       No current facility-administered medications for this visit.    History   Social History  . Marital Status: Single    Spouse Name: N/A  Number of Children: N/A  . Years of Education: N/A   Occupational History  . Not on file.   Social History Main Topics  . Smoking status: Never Smoker   . Smokeless tobacco: Not on file  . Alcohol Use: No  . Drug Use: No  . Sexual Activity: Yes    Birth Control/ Protection: Surgical   Other Topics Concern  . Not on file   Social History Narrative  . No narrative on file      Socially she is a single has no children. No tobacco alcohol use  History reviewed. No pertinent family history.  ROS is negative for fevers, chills or night sweats. She denies visual changes. She denies bruisability. She denies chest tightness. She does note some occasional palpitations. She denies wheezing. She denies abdominal pain. She denies nausea vomiting or diarrhea. She denies GU symptoms. She denies myalgias. She denies edema. She denies  neurologic symptoms.   Other comprehensive 12 point system review is negative.  PE BP 164/70  Pulse 70  Ht 5\' 4"  (1.626 m)  Wt 120 lb 4.8 oz (54.568 kg)  BMI 20.64 kg/m2  Repeat blood pressure was 160/70 General: Alert, oriented, no distress.  Skin: normal turgor, no rashes HEENT: Normocephalic, atraumatic. Pupils round and reactive; sclera anicteric;no lid lag.  Nose without nasal septal hypertrophy Mouth/Parynx benign; Mallinpatti scale 2 Neck: No JVD, no carotid briuts Lungs: clear to ausculatation and percussion; no wheezing or rales Heart: RRR, s1 s2 normal 2/6 murmur in the aortic region. Split S2. Abdomen: soft, nontender; no hepatosplenomehaly, BS+; abdominal aorta nontender and not dilated by palpation. Pulses 2+ Extremities: no clubbing cyanosis or edema, Homan's sign negative  Neurologic: grossly nonfocal  ECG: Sinus rhythm with left axis deviation and evidence for left bundle branch block. Rate 70 beats per minute.  LABS:  BMET    Component Value Date/Time   NA 131* 07/07/2012 2047   K 3.9 07/07/2012 2047   CL 96 07/07/2012 2047   CO2 23 07/07/2012 2047   GLUCOSE 109* 07/07/2012 2047   BUN 20 07/07/2012 2047   CREATININE 0.67 07/07/2012 2047   CALCIUM 9.7 07/07/2012 2047   GFRNONAA 75* 07/07/2012 2047   GFRAA 87* 07/07/2012 2047     Hepatic Function Panel     Component Value Date/Time   PROT 7.8 07/07/2012 2047   ALBUMIN 4.2 07/07/2012 2047   AST 21 07/07/2012 2047   ALT 20 07/07/2012 2047   ALKPHOS 48 07/07/2012 2047   BILITOT 0.6 07/07/2012 2047     CBC    Component Value Date/Time   WBC 4.7 07/07/2012 2047   RBC 4.08 07/07/2012 2047   HGB 13.0 07/07/2012 2047   HCT 36.0 07/07/2012 2047   PLT 241 07/07/2012 2047   MCV 88.2 07/07/2012 2047   MCH 31.9 07/07/2012 2047   MCHC 36.1* 07/07/2012 2047   RDW 13.4 07/07/2012 2047   LYMPHSABS 1.4 07/07/2012 2047   MONOABS 0.4 07/07/2012 2047   EOSABS 0.4 07/07/2012 2047   BASOSABS 0.0 07/07/2012 2047     BNP No results  found for this basename: probnp    Lipid Panel  No results found for this basename: chol, trig, hdl, cholhdl, vldl, ldlcalc     RADIOLOGY: No results found.    ASSESSMENT AND PLAN: Brittany Wallace is now 79 years old. Her most recent echo Doppler study shows improvement in her LV function out of 4045%. In the past she has had a nonischemic myopathy. She  has chronic left bundle branch block which is stable. She is hypertensive today despite being on and bloating but has April 10/40 combination, atenolol 12.5 mg, HCTZ 12.5 mg. I am recommending slight further titration of her atenolol to 12.5 mg twice a day. I will also discontinue her digoxin. She will monitor her blood pressure. I see her back in 6 months for followup evaluation or sooner if problems arise.     Lennette Bihari, MD, [facility]  12/28/2012 6:41 PM

## 2012-12-28 NOTE — Patient Instructions (Signed)
Stop Digoxin.  Increase Atenolol to 12.5mg  bid.  Your physician recommends that you schedule a follow-up appointment in: 6 months.

## 2012-12-29 ENCOUNTER — Encounter: Payer: Self-pay | Admitting: Cardiovascular Disease

## 2013-01-12 DIAGNOSIS — Z961 Presence of intraocular lens: Secondary | ICD-10-CM | POA: Diagnosis not present

## 2013-01-12 DIAGNOSIS — H353190 Nonexudative age-related macular degeneration, unspecified eye, stage unspecified: Secondary | ICD-10-CM | POA: Diagnosis not present

## 2013-01-12 DIAGNOSIS — H04129 Dry eye syndrome of unspecified lacrimal gland: Secondary | ICD-10-CM | POA: Diagnosis not present

## 2013-01-12 DIAGNOSIS — H4011X0 Primary open-angle glaucoma, stage unspecified: Secondary | ICD-10-CM | POA: Diagnosis not present

## 2013-01-24 DIAGNOSIS — Z23 Encounter for immunization: Secondary | ICD-10-CM | POA: Diagnosis not present

## 2013-03-14 DIAGNOSIS — H6690 Otitis media, unspecified, unspecified ear: Secondary | ICD-10-CM | POA: Diagnosis not present

## 2013-03-14 DIAGNOSIS — J309 Allergic rhinitis, unspecified: Secondary | ICD-10-CM | POA: Diagnosis not present

## 2013-03-14 DIAGNOSIS — IMO0002 Reserved for concepts with insufficient information to code with codable children: Secondary | ICD-10-CM | POA: Diagnosis not present

## 2013-03-28 DIAGNOSIS — K219 Gastro-esophageal reflux disease without esophagitis: Secondary | ICD-10-CM | POA: Diagnosis not present

## 2013-03-28 DIAGNOSIS — J370 Chronic laryngitis: Secondary | ICD-10-CM | POA: Diagnosis not present

## 2013-03-28 DIAGNOSIS — H6120 Impacted cerumen, unspecified ear: Secondary | ICD-10-CM | POA: Diagnosis not present

## 2013-05-17 ENCOUNTER — Other Ambulatory Visit: Payer: Self-pay | Admitting: Cardiovascular Disease

## 2013-05-17 NOTE — Telephone Encounter (Signed)
Rx was sent to pharmacy electronically. 

## 2013-07-13 DIAGNOSIS — H353190 Nonexudative age-related macular degeneration, unspecified eye, stage unspecified: Secondary | ICD-10-CM | POA: Diagnosis not present

## 2013-07-13 DIAGNOSIS — Z961 Presence of intraocular lens: Secondary | ICD-10-CM | POA: Diagnosis not present

## 2013-07-13 DIAGNOSIS — H4011X0 Primary open-angle glaucoma, stage unspecified: Secondary | ICD-10-CM | POA: Diagnosis not present

## 2013-07-13 DIAGNOSIS — H04129 Dry eye syndrome of unspecified lacrimal gland: Secondary | ICD-10-CM | POA: Diagnosis not present

## 2013-07-20 ENCOUNTER — Encounter: Payer: Self-pay | Admitting: Cardiovascular Disease

## 2013-07-20 ENCOUNTER — Ambulatory Visit (INDEPENDENT_AMBULATORY_CARE_PROVIDER_SITE_OTHER): Payer: Medicare Other | Admitting: Cardiovascular Disease

## 2013-07-20 VITALS — BP 118/64 | HR 54 | Ht 64.0 in | Wt 199.9 lb

## 2013-07-20 DIAGNOSIS — E785 Hyperlipidemia, unspecified: Secondary | ICD-10-CM

## 2013-07-20 DIAGNOSIS — R5381 Other malaise: Secondary | ICD-10-CM

## 2013-07-20 DIAGNOSIS — E782 Mixed hyperlipidemia: Secondary | ICD-10-CM

## 2013-07-20 DIAGNOSIS — I447 Left bundle-branch block, unspecified: Secondary | ICD-10-CM

## 2013-07-20 DIAGNOSIS — I10 Essential (primary) hypertension: Secondary | ICD-10-CM

## 2013-07-20 DIAGNOSIS — R5383 Other fatigue: Secondary | ICD-10-CM

## 2013-07-20 DIAGNOSIS — I428 Other cardiomyopathies: Secondary | ICD-10-CM

## 2013-07-20 MED ORDER — AMLODIPINE BESY-BENAZEPRIL HCL 5-40 MG PO CAPS: 1.0000 | ORAL_CAPSULE | Freq: Every day | ORAL | Status: DC

## 2013-07-20 NOTE — Progress Notes (Signed)
Patient ID: Brittany Wallace, female   DOB: Jul 10, 1920, 80 y.o.   MRN: [id number]        HPI: Brittany Wallace is a 80 y.o. female who presents to the office for a six-month cardiology evaluation.  Brittany Wallace is a 80 years old [facility] remains active and still cuts her grass. She has a remote history of a nonischemic cardiomyopathy and in 2001 her ejection fraction was 30%. This subsequently has increased and on 07/20/2012 echo Doppler study showed an ejection fraction of 40-45%. She had grade 1 diastolic dysfunction. She had mild/moderate pulmonary hypertension with a PA pressure of 48 mm, moderate tricuspid regurgitation, moderate mitral regurgitation with mitral annular calcification. She also has a history of left bundle branch block, hypertension, as well as hyperlipidemia.  Over the past 6 months, she denies any episodes of chest tightness.  She does note episodes of very mild vertigo.  She also has noticed some ankle swelling typically in the latter portion of the day almost daily.  She also complains of a salty taste in her mouth.  She denies PND or orthopnea.  She presents for evaluation.  Past Medical History  Diagnosis Date  . Hypertension   . Myocardial infarct     Past Surgical History  Procedure Laterality Date  . Abdominal hysterectomy    . Cholecystectomy    . Eye surgery      Allergies  Allergen Reactions  . Aspirin     High doses.  Brittany Wallace [Levofloxacin In D5w]     Fuzzy headed  . Pantoprazole     Current Outpatient Prescriptions  Medication Sig Dispense Refill  . amLODipine-benazepril (LOTREL) 10-40 MG per capsule Take 1 capsule by mouth daily.  30 capsule  11  . aspirin EC 81 MG tablet Take 81 mg by mouth daily.      Marland Kitchen atenolol (TENORMIN) 25 MG tablet Take 0.5 tablets (12.5 mg total) by mouth 2 (two) times daily.  60 tablet  5  . Calcium Citrate-Vitamin D (CITRACAL + D PO) Take 1 tablet by mouth daily.      . chlordiazePOXIDE (LIBRIUM) 10 MG capsule Take 10 mg by mouth  3 (three) times daily as needed for anxiety.      . Choline Fenofibrate 135 MG capsule Take 1 capsule (135 mg total) by mouth daily.  30 capsule  7  . hydrochlorothiazide (HYDRODIURIL) 25 MG tablet Take 0.5 tablets (12.5 mg total) by mouth daily.  30 tablet  3  . lansoprazole (PREVACID) 30 MG capsule Take 30 mg by mouth daily.      . Lutein 20 MG TABS Take 1 tablet by mouth daily.      . meclizine (ANTIVERT) 25 MG tablet Take 25 mg by mouth as needed.      . montelukast (SINGULAIR) 10 MG tablet Take 10 mg by mouth at bedtime.      . Multiple Vitamins-Minerals (CENTRUM SILVER) tablet Take 1 tablet by mouth daily.      . potassium chloride (KLOR-CON) 8 MEQ tablet TAKE (1) TABLET BY MOUTH TWICE DAILY.  60 tablet  8  . timolol (TIMOPTIC) 0.5 % ophthalmic solution Place 1 drop into both eyes 2 (two) times daily.      . diphenoxylate-atropine (LOMOTIL) 2.5-0.025 MG per tablet Take 1 tablet by mouth 4 (four) times daily as needed for diarrhea or loose stools.       No current facility-administered medications for this visit.    History   Social History  .  Marital Status: Single    Spouse Name: N/A    Number of Children: N/A  . Years of Education: N/A   Occupational History  . Not on file.   Social History Main Topics  . Smoking status: Never Smoker   . Smokeless tobacco: Never Used  . Alcohol Use: No  . Drug Use: No  . Sexual Activity: Yes    Birth Control/ Protection: Surgical   Other Topics Concern  . Not on file   Social History Narrative  . No narrative on file      Socially she is a single has no children. No tobacco alcohol use  History reviewed. No pertinent family history.  ROS General: Negative; No fevers, chills, or night sweats;  HEENT: Negative; No changes in vision or hearing, sinus congestion, difficulty swallowing Pulmonary: Negative; No cough, wheezing, shortness of breath, hemoptysis Cardiovascular: Negative; No chest pain, presyncope, syncope,  palpatations She admits to ankle swelling bilaterally GI: Negative; No nausea, vomiting, diarrhea, or abdominal pain GU: Negative; No dysuria, hematuria, or difficulty voiding Musculoskeletal: Negative; no myalgias, joint pain, or weakness Hematologic/Oncology: Negative; no easy bruising, bleeding Endocrine: Negative; no heat/cold intolerance Neuro: Mild vertigo; no , headaches Skin: Negative; No rashes or skin lesions Psychiatric: Negative; No behavioral problems, depression Sleep: Negative; No snoring, daytime sleepiness, hypersomnolence, bruxism, restless legs, hypnogognic hallucinations, no cataplexy  Other comprehensive 14 point system review is negative.  PE BP 118/64  Pulse 54  Ht 5\' 4"  (1.626 m)  Wt 199 lb 14.4 oz (90.674 kg)  BMI 34.30 kg/m2  Repeat blood pressure was 122/70 General: Alert, oriented, no distress.  Appears younger than stated age Skin: normal turgor, no rashes HEENT: Normocephalic, atraumatic. Pupils round and reactive; sclera anicteric;no lid lag.  Nose without nasal septal hypertrophy Mouth/Parynx benign; Mallinpatti scale 2 Neck: No JVD, no carotid bruis , with normal carotid upstroke Chest wall: Nontender to palpation Lungs: clear to ausculatation and percussion; no wheezing or rales Heart: RRR, s1 s2 normal 2/6 murmur in the aortic region. Split S2.  No S3 or S4 gallop.  No diastolic murmur, rubs, thrills or heaves. Abdomen: soft, nontender; no hepatosplenomehaly, BS+; abdominal aorta nontender and not dilated by palpation. Back: No CVA Pulses 2+ Extremities: Trivial ankle edema;no clubbing cyanosis, Homan's sign negative  Neurologic: grossly nonfocal Psychological: Normal affect and mood  ECG (independently read by me): Sinus bradycardia 54 beats per minute.  Left bundle branch block with repolarization changes.  Prior 12/28/2012 ECG: Sinus rhythm with left axis deviation and evidence for left bundle branch block. Rate 70 beats per  minute.  LABS:  BMET    Component Value Date/Time   NA 131* 07/07/2012 2047   K 3.9 07/07/2012 2047   CL 96 07/07/2012 2047   CO2 23 07/07/2012 2047   GLUCOSE 109* 07/07/2012 2047   BUN 20 07/07/2012 2047   CREATININE 0.67 07/07/2012 2047   CALCIUM 9.7 07/07/2012 2047   GFRNONAA 75* 07/07/2012 2047   GFRAA 87* 07/07/2012 2047     Hepatic Function Panel     Component Value Date/Time   PROT 7.8 07/07/2012 2047   ALBUMIN 4.2 07/07/2012 2047   AST 21 07/07/2012 2047   ALT 20 07/07/2012 2047   ALKPHOS 48 07/07/2012 2047   BILITOT 0.6 07/07/2012 2047     CBC    Component Value Date/Time   WBC 4.7 07/07/2012 2047   RBC 4.08 07/07/2012 2047   HGB 13.0 07/07/2012 2047   HCT 36.0 07/07/2012 2047  PLT 241 07/07/2012 2047   MCV 88.2 07/07/2012 2047   MCH 31.9 07/07/2012 2047   MCHC 36.1* 07/07/2012 2047   RDW 13.4 07/07/2012 2047   LYMPHSABS 1.4 07/07/2012 2047   MONOABS 0.4 07/07/2012 2047   EOSABS 0.4 07/07/2012 2047   BASOSABS 0.0 07/07/2012 2047     BNP No results found for this basename: probnp    Lipid Panel  No results found for this basename: chol,  trig,  hdl,  cholhdl,  vldl,  ldlcalc     RADIOLOGY: No results found.    ASSESSMENT AND PLAN: Brittany Wallace is a 80 year old female who has a history of hypertension, GERD, hyperlipidemia, admits to facial ankle swelling.  She has a remote history of a cardiomyopathy, but LV function has improved to approximately 45% on her last echo Doppler study of April 2014.  She did have moderate pulmonary hypertension with a PA pressure estimated at 48 mm. Her most recent echo Doppler study shows improvement in her LV function out of 40-45%. Her Left bundle branch block is chronic.  She is now off digoxin.  I am recommending she change her amlodipine-benazapril  10/40 to , a dose of 5/40, which hopefully will also help her leg swelling.  Her blood pressure today is well controlled.  I am checking a complete set of blood work in the fasting state  and adjustments will be made.  She is tolerating low-dose aspirin without bleeding.     Troy Sine, MD, [facility]  07/20/2013 2:41 PM

## 2013-07-20 NOTE — Patient Instructions (Signed)
Your physician has recommended you make the following change in your medication: Lotrel will be changed to 5-40. This has already been sent to the pharmacy.  Your physician recommends that you return for lab work fasting.  Your physician recommends that you schedule a follow-up appointment in: 6 monrths

## 2013-07-24 DIAGNOSIS — I10 Essential (primary) hypertension: Secondary | ICD-10-CM | POA: Diagnosis not present

## 2013-07-24 DIAGNOSIS — E782 Mixed hyperlipidemia: Secondary | ICD-10-CM | POA: Diagnosis not present

## 2013-07-24 DIAGNOSIS — R5383 Other fatigue: Secondary | ICD-10-CM | POA: Diagnosis not present

## 2013-07-24 DIAGNOSIS — R5381 Other malaise: Secondary | ICD-10-CM | POA: Diagnosis not present

## 2013-07-25 LAB — COMPREHENSIVE METABOLIC PANEL
ALBUMIN: 4.6 g/dL (ref 3.5–5.2)
ALT: 15 U/L (ref 0–35)
AST: 19 U/L (ref 0–37)
Alkaline Phosphatase: 40 U/L (ref 39–117)
BUN: 13 mg/dL (ref 6–23)
CHLORIDE: 105 meq/L (ref 96–112)
CO2: 26 mEq/L (ref 19–32)
Calcium: 9.8 mg/dL (ref 8.4–10.5)
Creat: 0.66 mg/dL (ref 0.50–1.10)
Glucose, Bld: 94 mg/dL (ref 70–99)
Potassium: 4.4 mEq/L (ref 3.5–5.3)
SODIUM: 140 meq/L (ref 135–145)
TOTAL PROTEIN: 7.4 g/dL (ref 6.0–8.3)
Total Bilirubin: 0.8 mg/dL (ref 0.2–1.2)

## 2013-07-25 LAB — LIPID PANEL
Cholesterol: 140 mg/dL (ref 0–200)
HDL: 32 mg/dL — AB (ref >39)
LDL Cholesterol: 80 mg/dL (ref 0–99)
Total CHOL/HDL Ratio: 4.4 Ratio
Triglycerides: 142 mg/dL (ref <150)
VLDL: 28 mg/dL (ref 0–40)

## 2013-07-25 LAB — CBC
HEMATOCRIT: 39.4 % (ref 36.0–46.0)
Hemoglobin: 13.2 g/dL (ref 12.0–15.0)
MCH: 30.3 pg (ref 26.0–34.0)
MCHC: 33.5 g/dL (ref 30.0–36.0)
MCV: 90.6 fL (ref 78.0–100.0)
Platelets: 250 10*3/uL (ref 150–400)
RBC: 4.35 MIL/uL (ref 3.87–5.11)
RDW: 14.1 % (ref 11.5–15.5)
WBC: 4.1 10*3/uL (ref 4.0–10.5)

## 2013-07-25 LAB — TSH: TSH: 11.232 u[IU]/mL — AB (ref 0.350–4.500)

## 2013-08-15 ENCOUNTER — Telehealth: Payer: Self-pay | Admitting: *Deleted

## 2013-08-15 ENCOUNTER — Other Ambulatory Visit: Payer: Self-pay | Admitting: Cardiovascular Disease

## 2013-08-15 ENCOUNTER — Encounter: Payer: Self-pay | Admitting: *Deleted

## 2013-08-15 DIAGNOSIS — R7989 Other specified abnormal findings of blood chemistry: Secondary | ICD-10-CM

## 2013-08-15 DIAGNOSIS — Z79899 Other long term (current) drug therapy: Secondary | ICD-10-CM

## 2013-08-15 NOTE — Telephone Encounter (Signed)
Called patient to give lab results and recommendations. No answer or machine. Will send patient a results letter.

## 2013-08-15 NOTE — Telephone Encounter (Signed)
Rx was sent to pharmacy electronically. 

## 2013-08-15 NOTE — Telephone Encounter (Signed)
Message copied by Lauralee Evener on Tue Aug 15, 2013 12:33 PM ------      Message from: Shelva Majestic A      Created: Sun Aug 06, 2013  9:34 PM       Labs oK but TSH increased; recheck TSH, free T3 and free T4 and if confirmed will need to initiate Rx. ------

## 2013-08-22 DIAGNOSIS — R7989 Other specified abnormal findings of blood chemistry: Secondary | ICD-10-CM | POA: Diagnosis not present

## 2013-08-22 DIAGNOSIS — Z79899 Other long term (current) drug therapy: Secondary | ICD-10-CM | POA: Diagnosis not present

## 2013-08-23 LAB — TSH: TSH: 9.344 u[IU]/mL — ABNORMAL HIGH (ref 0.350–4.500)

## 2013-08-23 LAB — T3, FREE: T3 FREE: 2.2 pg/mL — AB (ref 2.3–4.2)

## 2013-08-23 LAB — T4, FREE: Free T4: 1 ng/dL (ref 0.80–1.80)

## 2013-08-30 ENCOUNTER — Telehealth: Payer: Self-pay | Admitting: Cardiovascular Disease

## 2013-08-30 NOTE — Telephone Encounter (Signed)
Dr Claiborne Billings lower her dosage on her Lisinopril,pt says she have not been feeling right. She feels lightheaded,like she is going to fall over.

## 2013-08-30 NOTE — Telephone Encounter (Signed)
Pt.s caregiver states about three weeks after her last visit with Dr. Claiborne Billings when he lowered her lisinopril she started having lightheadedness , I instructed pt caregiver to record BP and pulse over the next three days and call on Monday with the results, also if top number is not at least 100 that she should not give the lisinopril, caregiver stated understanding of instructions

## 2013-09-01 ENCOUNTER — Telehealth: Payer: Self-pay | Admitting: Cardiovascular Disease

## 2013-09-01 NOTE — Telephone Encounter (Signed)
Pt. Called in BP results for 6/10 and 6/11 wants to know what she should do, or do you want Kelle Darting to see her in BP clinic

## 2013-09-01 NOTE — Telephone Encounter (Signed)
Brittany Wallace is calling back with her BP and pulse readings as instructed on 08/30/13.  08/30/13 @ 3 PM  BP 183/79   HR 74--08/31/13 @ 7:10 AM  BP 175/85  HR 67---08/31/13 @ 4:00 pm  BP  165/75  HR  66----Today 09/01/13--patient was supposed to take her blood pressure and pulse before she ate----BP  174/84  HR  65---- hour after she ate  BP  160/74  HR  56.  All of these readings are per [name].     Patient wants to know what to do about her BP medication.

## 2013-09-04 NOTE — Telephone Encounter (Signed)
Pt is not on lisinopril but dose of lotrel was reduced from 10/40 to 5/40.

## 2013-09-04 NOTE — Telephone Encounter (Signed)
JC you spoke with this patient on 6/10 and requested for her to call the readings back to you. Please call patient back.

## 2013-09-05 NOTE — Telephone Encounter (Signed)
Pt. Stated she was doing much better now and didn't think she needed to come in and that her BP's were doing much better

## 2013-09-19 DIAGNOSIS — J370 Chronic laryngitis: Secondary | ICD-10-CM | POA: Diagnosis not present

## 2013-09-19 DIAGNOSIS — K219 Gastro-esophageal reflux disease without esophagitis: Secondary | ICD-10-CM | POA: Diagnosis not present

## 2013-09-19 DIAGNOSIS — J309 Allergic rhinitis, unspecified: Secondary | ICD-10-CM | POA: Diagnosis not present

## 2013-09-26 DIAGNOSIS — I951 Orthostatic hypotension: Secondary | ICD-10-CM | POA: Diagnosis not present

## 2013-09-26 DIAGNOSIS — K219 Gastro-esophageal reflux disease without esophagitis: Secondary | ICD-10-CM | POA: Diagnosis not present

## 2013-09-26 DIAGNOSIS — Z681 Body mass index (BMI) 19 or less, adult: Secondary | ICD-10-CM | POA: Diagnosis not present

## 2013-09-29 ENCOUNTER — Telehealth: Payer: Self-pay | Admitting: Cardiovascular Disease

## 2013-09-29 NOTE — Telephone Encounter (Signed)
Please call,wants to know how her thyroids result were.

## 2013-09-29 NOTE — Telephone Encounter (Signed)
CALLED LEFT MESSAGE  TO BACK.  Results were faxed TO DR McGough's office.

## 2013-09-29 NOTE — Telephone Encounter (Signed)
RN SPOKE TO MS Hollenberg. RN ASKED HOW SHE IS RELATED TO PATIENT. 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 SHE HAS POA.  RN GAVE MS SPAULDING RESULTS- AND INFORMED HER THE THE RESULTS WERE SENT TO DR Darlington  TO MANAGE. MS [place] PATIENT HAD AN APPOINTMENT WITH DR [facility] ON 09/26/13  AND RESULTS WERE NOT THERE.  RN INFORMED MS SPAULDING THAT RESULTS WERE SENT ON 09/27/13.SHE STATES SHE WILL CONTACT DR MCGOUGH'S OFFICE TO SEE IF THE RESULTS HAVE BEEN RECEIVED AND IF NOT COULD WE FAX THEM. RN INFORMED MS SPAULDINGWILL BE GLAD TO DO THAT FOR HER.

## 2013-10-04 ENCOUNTER — Encounter (INDEPENDENT_AMBULATORY_CARE_PROVIDER_SITE_OTHER): Payer: Self-pay | Admitting: *Deleted

## 2013-10-24 DIAGNOSIS — R42 Dizziness and giddiness: Secondary | ICD-10-CM | POA: Diagnosis not present

## 2013-10-24 DIAGNOSIS — R5383 Other fatigue: Secondary | ICD-10-CM | POA: Diagnosis not present

## 2013-10-24 DIAGNOSIS — Z681 Body mass index (BMI) 19 or less, adult: Secondary | ICD-10-CM | POA: Diagnosis not present

## 2013-10-24 DIAGNOSIS — R5381 Other malaise: Secondary | ICD-10-CM | POA: Diagnosis not present

## 2013-10-26 ENCOUNTER — Other Ambulatory Visit (HOSPITAL_COMMUNITY): Payer: Self-pay | Admitting: Family Medicine

## 2013-10-26 DIAGNOSIS — R220 Localized swelling, mass and lump, head: Secondary | ICD-10-CM

## 2013-10-26 DIAGNOSIS — R221 Localized swelling, mass and lump, neck: Principal | ICD-10-CM

## 2013-10-26 DIAGNOSIS — R42 Dizziness and giddiness: Secondary | ICD-10-CM

## 2013-10-30 ENCOUNTER — Ambulatory Visit (HOSPITAL_COMMUNITY)
Admission: RE | Admit: 2013-10-30 | Discharge: 2013-10-30 | Disposition: A | Discharge status: Home / Self Care | Payer: Medicare Other | Source: Ambulatory Visit | Attending: Family Medicine | Admitting: Family Medicine

## 2013-10-30 DIAGNOSIS — D320 Benign neoplasm of cerebral meninges: Secondary | ICD-10-CM | POA: Diagnosis not present

## 2013-10-30 DIAGNOSIS — R220 Localized swelling, mass and lump, head: Secondary | ICD-10-CM | POA: Insufficient documentation

## 2013-10-30 DIAGNOSIS — R42 Dizziness and giddiness: Secondary | ICD-10-CM | POA: Insufficient documentation

## 2013-10-30 DIAGNOSIS — R221 Localized swelling, mass and lump, neck: Principal | ICD-10-CM

## 2013-10-30 LAB — POCT I-STAT CREATININE: Creatinine, Ser: 0.7 mg/dL (ref 0.50–1.10)

## 2013-10-30 MED ORDER — GADOBENATE DIMEGLUMINE 529 MG/ML IV SOLN
10.0000 mL | Freq: Once | INTRAVENOUS | Status: AC | PRN
  Administered 2013-10-30: 10 mL via INTRAVENOUS

## 2013-11-09 ENCOUNTER — Other Ambulatory Visit: Payer: Self-pay | Admitting: Cardiovascular Disease

## 2013-11-09 NOTE — Telephone Encounter (Signed)
Rx was sent to pharmacy electronically. 

## 2013-11-13 ENCOUNTER — Telehealth (INDEPENDENT_AMBULATORY_CARE_PROVIDER_SITE_OTHER): Payer: Self-pay | Admitting: *Deleted

## 2013-11-13 ENCOUNTER — Encounter (INDEPENDENT_AMBULATORY_CARE_PROVIDER_SITE_OTHER): Payer: Self-pay | Admitting: *Deleted

## 2013-11-13 ENCOUNTER — Ambulatory Visit (INDEPENDENT_AMBULATORY_CARE_PROVIDER_SITE_OTHER): Payer: Medicare Other | Admitting: Internal Medicine

## 2013-11-13 ENCOUNTER — Encounter (INDEPENDENT_AMBULATORY_CARE_PROVIDER_SITE_OTHER): Payer: Self-pay | Admitting: Internal Medicine

## 2013-11-13 VITALS — BP 158/68 | HR 80 | Temp 97.6°F | Ht 64.0 in | Wt 120.1 lb

## 2013-11-13 DIAGNOSIS — R110 Nausea: Secondary | ICD-10-CM

## 2013-11-13 NOTE — Patient Instructions (Signed)
US abdomen. Prevacid 30mg  twice a day

## 2013-11-13 NOTE — Telephone Encounter (Signed)
Needs a new Rx for PREVACID with update dose change sent to [facility].

## 2013-11-13 NOTE — Progress Notes (Signed)
Subjective:     Patient ID: Brittany Wallace, female   DOB: 05/27/1920, 80 y.o.   MRN: [id number]  HPI Referred to our office by [facility] (Dr. Orson Ape) for GERD. Saw Dr. Orson Ape in July.  She saw Dr. Berdine Addison in [place], [place], ENT. Her last visit was in June.  She did not have the laryngoscope. Patient was told her throat was red.  She tells me she is having acid reflux. She says she doesn't have a lot of acid reflux. She may have acid reflux every couple of months.  She does have some nausea. She does have frequent, hacky cough . Symptoms started in April. No foods really bother her.  She is nauseated for just a few seconds sometimes during the night. If she takes a deep breath, nausea will resolve. This really does not occur a lot.  Appetite is good for the most part. No weight loss. (Weight in April of 2015 documented at  199 and caregiver states this is inaccurate).    Review of Systems     Past Medical History  Diagnosis Date  . Hypertension   . Myocardial infarct     1995-1996  . Hypothyroidism     Past Surgical History  Procedure Laterality Date  . Abdominal hysterectomy    . Eye surgery    . Cholecystectomy      Allergies  Allergen Reactions  . Aspirin     High doses.  [place] [Levofloxacin In D5w]     Fuzzy headed  . Pantoprazole     Current Outpatient Prescriptions on File Prior to Visit  Medication Sig Dispense Refill  . amLODipine-benazepril (LOTREL) 5-40 MG per capsule Take 1 capsule by mouth daily.  30 capsule  6  . aspirin EC 81 MG tablet Take 81 mg by mouth daily.      Marland Kitchen atenolol (TENORMIN) 25 MG tablet TAKE (1/2) TABLET BY MOUTH TWICE DAILY.  60 tablet  8  . chlordiazePOXIDE (LIBRIUM) 10 MG capsule Take 10 mg by mouth 3 (three) times daily as needed for anxiety.      . Choline Fenofibrate (FENOFIBRIC ACID) 135 MG CPDR TAKE ONE CAPSULE DAILY.  30 capsule  11  . diphenoxylate-atropine (LOMOTIL) 2.5-0.025 MG per tablet Take 1 tablet by mouth 4 (four)  times daily as needed for diarrhea or loose stools.      . hydrochlorothiazide (HYDRODIURIL) 25 MG tablet Take 0.5 tablets (12.5 mg total) by mouth daily.  30 tablet  3  . lansoprazole (PREVACID) 30 MG capsule Take 30 mg by mouth daily.      . Lutein 20 MG TABS Take 1 tablet by mouth daily.      . meclizine (ANTIVERT) 25 MG tablet Take 25 mg by mouth as needed.      . montelukast (SINGULAIR) 10 MG tablet Take 10 mg by mouth at bedtime.      . Multiple Vitamins-Minerals (CENTRUM SILVER) tablet Take 1 tablet by mouth daily.      . potassium chloride (KLOR-CON) 8 MEQ tablet TAKE (1) TABLET BY MOUTH TWICE DAILY.  60 tablet  8  . timolol (TIMOPTIC) 0.5 % ophthalmic solution Place 1 drop into both eyes 2 (two) times daily.      . Calcium Citrate-Vitamin D (CITRACAL + D PO) Take 1 tablet by mouth daily.       No current facility-administered medications on file prior to visit.     Objective:   Physical Exam  Filed Vitals:   11/13/13  1448  BP: 158/68  Pulse: 80  Temp: 97.6 F (36.4 C)  Height: 5\' 4"  (1.626 m)  Weight: 120 lb 1.6 oz (54.477 kg)  Alert and oriented. Skin warm and dry. Oral mucosa is moist.   . Sclera anicteric, conjunctivae is pink. No redness to pharynx.  Thyroid not enlarged. No cervical lymphadenopathy. Lungs clear. Heart regular rate and rhythm.  Abdomen is soft. Bowel sounds are positive. No hepatomegaly. No abdominal masses felt. No tenderness.  No edema to lower extremities.  Stool brown and guaiac negative.       Assessment:    Nausea ? Etiology. Doubt she has PUD. Cholecystectomy in the past.    Plan:   US abdomen complete . OV in 1 month. Take Prevacid 30mg  BID x 2 weeks.

## 2013-11-15 ENCOUNTER — Other Ambulatory Visit (INDEPENDENT_AMBULATORY_CARE_PROVIDER_SITE_OTHER): Payer: Self-pay | Admitting: Internal Medicine

## 2013-11-15 ENCOUNTER — Encounter: Payer: Self-pay | Admitting: Physician Assistant

## 2013-11-15 ENCOUNTER — Ambulatory Visit (INDEPENDENT_AMBULATORY_CARE_PROVIDER_SITE_OTHER): Payer: Medicare Other | Admitting: Physician Assistant

## 2013-11-15 VITALS — BP 160/82 | HR 72 | Ht 64.0 in | Wt 121.0 lb

## 2013-11-15 DIAGNOSIS — R42 Dizziness and giddiness: Secondary | ICD-10-CM

## 2013-11-15 DIAGNOSIS — K219 Gastro-esophageal reflux disease without esophagitis: Secondary | ICD-10-CM

## 2013-11-15 MED ORDER — LANSOPRAZOLE 30 MG PO CPDR: 30.0000 mg | DELAYED_RELEASE_CAPSULE | Freq: Two times a day (BID) | ORAL | Status: DC

## 2013-11-15 NOTE — Progress Notes (Signed)
Patient ID: Brittany Wallace, female   DOB: Apr 19, 1920, 80 y.o.   MRN: [id number]    OFFICE NOTE  Chief Complaint:  Unsteadiness   Primary Care Physician: Rocky Morel, MD  HPI:  Brittany Wallace is a 80 years old WF. She has a remote history of a nonischemic cardiomyopathy and in 2001 her ejection fraction was 30%. This subsequently has increased and on 07/20/2012 echo Doppler study showed an ejection fraction of 40-45%. She had grade 1 diastolic dysfunction. She had mild/moderate pulmonary hypertension with a PA pressure of 48 mm, moderate tricuspid regurgitation, moderate mitral regurgitation with mitral annular calcification. She also has a history of left bundle branch block, hypertension, as well as hyperlipidemia.  Today she presents for evaluation of feeling off balance. Recently seen by PCP for feeling off balance when standing up. They told her to come in to see cardiologist prior to scheduled appointment in October. States when she stands up she can't balance herself and if she does not have something to hold on to something, though notes she is not dizzy or light headed. Has been going on for years, though notes it has been worsened since April. Is able to use walker ok. Notes sometimes legs give out on her. No chest pain. Notes some shortness of breath with being up working, though not more than usual. Notes it is not bad, notes that she is more aware of her breathing. No syncope. No orthopnea. No palpitations. Notes meclizine has not helped with feeling off balance. Takes librium 1-2 times per year. Notes that she has done physical therapy previously and notes she did one session. States they did not provide her with anything she could not do on her own.  PMHx:  Past Medical History  Diagnosis Date  . Hypertension   . Myocardial infarct     1995-1996  . Hypothyroidism     Past Surgical History  Procedure Laterality Date  . Abdominal hysterectomy    . Eye surgery    .  Cholecystectomy      FAMHx:  No family history on file.  SOCHx:   reports that she has never smoked. She has never used smokeless tobacco. She reports that she does not drink alcohol or use illicit drugs.  ALLERGIES:  Allergies  Allergen Reactions  . Aspirin     High doses.  Mack Hook [Levofloxacin In D5w]     Fuzzy headed  . Pantoprazole     ROS: A comprehensive review of systems was negative except for: unsteadiness  HOME MEDS: Current Outpatient Prescriptions  Medication Sig Dispense Refill  . amLODipine-benazepril (LOTREL) 5-40 MG per capsule Take 1 capsule by mouth daily.  30 capsule  6  . aspirin EC 81 MG tablet Take 81 mg by mouth daily.      Marland Kitchen atenolol (TENORMIN) 25 MG tablet TAKE (1/2) TABLET BY MOUTH TWICE DAILY.  60 tablet  8  . Calcium Citrate-Vitamin D (CITRACAL + D PO) Take 1 tablet by mouth daily.      . chlordiazePOXIDE (LIBRIUM) 10 MG capsule Take 10 mg by mouth 3 (three) times daily as needed for anxiety.      . Choline Fenofibrate (FENOFIBRIC ACID) 135 MG CPDR TAKE ONE CAPSULE DAILY.  30 capsule  11  . diphenoxylate-atropine (LOMOTIL) 2.5-0.025 MG per tablet Take 1 tablet by mouth 4 (four) times daily as needed for diarrhea or loose stools.      . hydrochlorothiazide (HYDRODIURIL) 25 MG tablet Take 0.5 tablets (12.5 mg  total) by mouth daily.  30 tablet  3  . lansoprazole (PREVACID) 30 MG capsule Take 1 capsule (30 mg total) by mouth 2 (two) times daily before a meal.  60 capsule  0  . levothyroxine (SYNTHROID, LEVOTHROID) 50 MCG tablet Take 50 mcg by mouth daily before breakfast.      . Lutein 20 MG TABS Take 1 tablet by mouth daily.      . meclizine (ANTIVERT) 25 MG tablet Take 25 mg by mouth as needed.      . montelukast (SINGULAIR) 10 MG tablet Take 10 mg by mouth at bedtime.      . Multiple Vitamins-Minerals (CENTRUM SILVER) tablet Take 1 tablet by mouth daily.      . potassium chloride (KLOR-CON) 8 MEQ tablet TAKE (1) TABLET BY MOUTH TWICE DAILY.  60  tablet  8  . timolol (TIMOPTIC) 0.5 % ophthalmic solution Place 1 drop into both eyes 2 (two) times daily.       No current facility-administered medications for this visit.    LABS/IMAGING: No results found for this or any previous visit (from the past 48 hour(s)). No results found.  VITALS: BP 160/82  Pulse 72  Ht 5\' 4"  (1.626 m)  Wt 121 lb (54.885 kg)  BMI 20.76 kg/m2  EXAM: General appearance: alert, cooperative and no distress Neck: no adenopathy, no carotid bruit, no JVD and supple, symmetrical, trachea midline Lungs: clear to auscultation bilaterally Heart: regular rate and rhythm, S1, S2 normal, no murmur, click, rub or gallop Extremities: extremities normal, atraumatic, no cyanosis or edema Pulses: 2+ and symmetric radial Skin: Skin color, texture, turgor normal. No rashes or lesions Neurologic: CN 2-12 intact, 5/5 strength in bilateral biceps, triceps, grip, quads, hamstrings, plantar and dorsiflexion, sensation to light touch intact in bilateral UE and LE, absent patellar reflexes, negative epley maneuver    EKG: Left bundle branch block, rate 72  ASSESSMENT: 1. Unsteadiness 2. Hypertension- elevated slightly today  PLAN: 1.   Patient with unsteadiness likely related to age related muscular decline, though must also consider inner ear process though this seems less likely given lack of overt vertigo. Unlikely to be related to cardiac issue given lack of cardiac symptoms and unchanged EKG. Discussed possible benefit of referral to physical therapy, though patient would prefer that she attempt exercises at home on her own. She could potentially benefit from vestibular rehab for possible inner ear issue. She will follow-up with PCP if she continues to have issues with these symptoms. Will continue current antihypertensives at this time. She will follow-up with Dr Claiborne Billings in 6 months.    Tommi Rumps 11/15/2013, 3:23 PM

## 2013-11-15 NOTE — Patient Instructions (Signed)
Nice to meet you. Increase your exercise as tolerated. Follow-up in 6 months with Dr Claiborne Billings.

## 2013-11-16 DIAGNOSIS — H04129 Dry eye syndrome of unspecified lacrimal gland: Secondary | ICD-10-CM | POA: Diagnosis not present

## 2013-11-16 DIAGNOSIS — H538 Other visual disturbances: Secondary | ICD-10-CM | POA: Diagnosis not present

## 2013-11-23 ENCOUNTER — Ambulatory Visit (HOSPITAL_COMMUNITY)
Admission: RE | Admit: 2013-11-23 | Discharge: 2013-11-23 | Disposition: A | Discharge status: Home / Self Care | Payer: Medicare Other | Source: Ambulatory Visit | Attending: Internal Medicine | Admitting: Internal Medicine

## 2013-11-23 DIAGNOSIS — R110 Nausea: Secondary | ICD-10-CM | POA: Diagnosis not present

## 2013-11-23 DIAGNOSIS — K219 Gastro-esophageal reflux disease without esophagitis: Secondary | ICD-10-CM | POA: Diagnosis not present

## 2013-12-11 ENCOUNTER — Other Ambulatory Visit (INDEPENDENT_AMBULATORY_CARE_PROVIDER_SITE_OTHER): Payer: Self-pay | Admitting: Internal Medicine

## 2013-12-14 ENCOUNTER — Ambulatory Visit (INDEPENDENT_AMBULATORY_CARE_PROVIDER_SITE_OTHER): Payer: Medicare Other | Admitting: Internal Medicine

## 2013-12-14 ENCOUNTER — Encounter (INDEPENDENT_AMBULATORY_CARE_PROVIDER_SITE_OTHER): Payer: Self-pay | Admitting: *Deleted

## 2013-12-14 ENCOUNTER — Encounter (INDEPENDENT_AMBULATORY_CARE_PROVIDER_SITE_OTHER): Payer: Self-pay | Admitting: Internal Medicine

## 2013-12-14 VITALS — BP 124/50 | HR 60 | Temp 97.5°F | Ht 64.0 in | Wt 120.6 lb

## 2013-12-14 DIAGNOSIS — K219 Gastro-esophageal reflux disease without esophagitis: Secondary | ICD-10-CM

## 2013-12-14 DIAGNOSIS — R110 Nausea: Secondary | ICD-10-CM | POA: Diagnosis not present

## 2013-12-14 NOTE — Progress Notes (Signed)
Subjective:    Patient ID: Brittany Wallace, female    DOB: Feb 25, 1921, 80 y.o.   MRN: [id number]  HPI Here today for f/u of her GERD. She was last seen 1 month ago for same. She was started on Prevacid BID x 2 weeks. She says her acid reflux is the same. Sometimes she has acid reflux and some days she doesn't. Occasionally has nausea. If she breaths in and out her nausea resolves. Appetite is good. There has been no weight loss. She has a dry hacky cough but not every days. She says she has had for along time. No dysphagia. She really has not noticed a difference in taking the Prevacid twice a day.  Does not want an EGD.   11/23/2013 US abdomen:  IMPRESSION:  1. No abnormality of the liver. No ductal dilatation.  2. Prior cholecystectomy.  3. Atheromatous change throughout the abdominal aorta with no focal  aneurysm evident.      Review of Systems Past Medical History  Diagnosis Date  . Hypertension   . Myocardial infarct     1995-1996  . Hypothyroidism     Past Surgical History  Procedure Laterality Date  . Abdominal hysterectomy    . Eye surgery    . Cholecystectomy      Allergies  Allergen Reactions  . Aspirin     High doses.  Mack Hook [Levofloxacin In D5w]     Fuzzy headed  . Pantoprazole     Current Outpatient Prescriptions on File Prior to Visit  Medication Sig Dispense Refill  . amLODipine-benazepril (LOTREL) 5-40 MG per capsule Take 1 capsule by mouth daily.  30 capsule  6  . aspirin EC 81 MG tablet Take 81 mg by mouth daily.      Marland Kitchen atenolol (TENORMIN) 25 MG tablet TAKE (1/2) TABLET BY MOUTH TWICE DAILY.  60 tablet  8  . Calcium Citrate-Vitamin D (CITRACAL + D PO) Take 1 tablet by mouth daily.      . chlordiazePOXIDE (LIBRIUM) 10 MG capsule Take 10 mg by mouth 3 (three) times daily as needed for anxiety.      . Choline Fenofibrate (FENOFIBRIC ACID) 135 MG CPDR TAKE ONE CAPSULE DAILY.  30 capsule  11  . diphenoxylate-atropine (LOMOTIL) 2.5-0.025 MG per tablet  Take 1 tablet by mouth 4 (four) times daily as needed for diarrhea or loose stools.      . hydrochlorothiazide (HYDRODIURIL) 25 MG tablet Take 0.5 tablets (12.5 mg total) by mouth daily.  30 tablet  3  . lansoprazole (PREVACID) 30 MG capsule TAKE 1 CAPSULE 2 TIMES DAILY BEFORE A MEAL AND OTHER MEDICATIONS.  60 capsule  5  . levothyroxine (SYNTHROID, LEVOTHROID) 50 MCG tablet Take 50 mcg by mouth daily before breakfast.      . Lutein 20 MG TABS Take 1 tablet by mouth daily.      . meclizine (ANTIVERT) 25 MG tablet Take 25 mg by mouth as needed.      . montelukast (SINGULAIR) 10 MG tablet Take 10 mg by mouth at bedtime.      . Multiple Vitamins-Minerals (CENTRUM SILVER) tablet Take 1 tablet by mouth daily.      . potassium chloride (KLOR-CON) 8 MEQ tablet TAKE (1) TABLET BY MOUTH TWICE DAILY.  60 tablet  8  . timolol (TIMOPTIC) 0.5 % ophthalmic solution Place 1 drop into both eyes 2 (two) times daily.       No current facility-administered medications on file prior to visit.  Objective:   Physical Exam  Filed Vitals:   12/14/13 1002  BP: 124/50  Pulse: 60  Temp: 97.5 F (36.4 C)  Height: 5\' 4"  (1.626 m)  Weight: 120 lb 9.6 oz (54.704 kg)  Alert and oriented. Skin warm and dry. Oral mucosa is moist.   . Sclera anicteric, conjunctivae is pink. Thyroid not enlarged. No cervical lymphadenopathy. Lungs clear. Heart regular rate and rhythm.  Abdomen is soft. Bowel sounds are positive. No hepatomegaly. No abdominal masses felt. No tenderness.  No edema to lower extremities.           Assessment & Plan:  H. Pylori. CMET. Continue the Prevacid. OV in 6 months. (Patient declined an EGD. States her symptoms are really not bothersome).

## 2013-12-14 NOTE — Patient Instructions (Signed)
Continue the Prevacid. OV in 6 months. I will call you with the results.

## 2013-12-15 LAB — CBC WITH DIFFERENTIAL/PLATELET
BASOS ABS: 0.1 10*3/uL (ref 0.0–0.1)
Basophils Relative: 2 % — ABNORMAL HIGH (ref 0–1)
Eosinophils Absolute: 0.4 10*3/uL (ref 0.0–0.7)
Eosinophils Relative: 9 % — ABNORMAL HIGH (ref 0–5)
HCT: 39 % (ref 36.0–46.0)
Hemoglobin: 12.9 g/dL (ref 12.0–15.0)
Lymphocytes Relative: 23 % (ref 12–46)
Lymphs Abs: 1.1 10*3/uL (ref 0.7–4.0)
MCH: 30.4 pg (ref 26.0–34.0)
MCHC: 33.1 g/dL (ref 30.0–36.0)
MCV: 92 fL (ref 78.0–100.0)
Monocytes Absolute: 0.4 10*3/uL (ref 0.1–1.0)
Monocytes Relative: 9 % (ref 3–12)
NEUTROS ABS: 2.7 10*3/uL (ref 1.7–7.7)
NEUTROS PCT: 57 % (ref 43–77)
Platelets: 270 10*3/uL (ref 150–400)
RBC: 4.24 MIL/uL (ref 3.87–5.11)
RDW: 13.9 % (ref 11.5–15.5)
WBC: 4.7 10*3/uL (ref 4.0–10.5)

## 2013-12-15 LAB — COMPREHENSIVE METABOLIC PANEL
ALBUMIN: 4.1 g/dL (ref 3.5–5.2)
ALK PHOS: 34 U/L — AB (ref 39–117)
ALT: 13 U/L (ref 0–35)
AST: 17 U/L (ref 0–37)
BUN: 19 mg/dL (ref 6–23)
CO2: 25 mEq/L (ref 19–32)
Calcium: 9 mg/dL (ref 8.4–10.5)
Chloride: 104 mEq/L (ref 96–112)
Creat: 0.98 mg/dL (ref 0.50–1.10)
GLUCOSE: 90 mg/dL (ref 70–99)
POTASSIUM: 4.4 meq/L (ref 3.5–5.3)
SODIUM: 135 meq/L (ref 135–145)
Total Bilirubin: 0.8 mg/dL (ref 0.2–1.2)
Total Protein: 7 g/dL (ref 6.0–8.3)

## 2013-12-15 LAB — H. PYLORI ANTIBODY, IGG: H Pylori IgG: 1.2 {ISR} — ABNORMAL HIGH

## 2014-01-02 DIAGNOSIS — E039 Hypothyroidism, unspecified: Secondary | ICD-10-CM | POA: Diagnosis not present

## 2014-01-02 DIAGNOSIS — Z23 Encounter for immunization: Secondary | ICD-10-CM | POA: Diagnosis not present

## 2014-02-08 ENCOUNTER — Other Ambulatory Visit: Payer: Self-pay | Admitting: Cardiovascular Disease

## 2014-02-08 NOTE — Telephone Encounter (Signed)
Rx has been sent to the pharmacy electronically. ° °

## 2014-02-21 ENCOUNTER — Other Ambulatory Visit: Payer: Self-pay | Admitting: Cardiovascular Disease

## 2014-02-21 NOTE — Telephone Encounter (Signed)
Rx has been sent to the pharmacy electronically. ° °

## 2014-03-01 ENCOUNTER — Ambulatory Visit (HOSPITAL_COMMUNITY)
Admission: RE | Admit: 2014-03-01 | Discharge: 2014-03-01 | Disposition: A | Discharge status: Home / Self Care | Payer: Medicare Other | Source: Ambulatory Visit | Attending: Physician Assistant | Admitting: Physician Assistant

## 2014-03-01 ENCOUNTER — Other Ambulatory Visit (HOSPITAL_COMMUNITY): Payer: Self-pay | Admitting: Physician Assistant

## 2014-03-01 DIAGNOSIS — W19XXXA Unspecified fall, initial encounter: Secondary | ICD-10-CM | POA: Insufficient documentation

## 2014-03-01 DIAGNOSIS — S93401A Sprain of unspecified ligament of right ankle, initial encounter: Secondary | ICD-10-CM | POA: Diagnosis not present

## 2014-03-01 DIAGNOSIS — Z681 Body mass index (BMI) 19 or less, adult: Secondary | ICD-10-CM | POA: Diagnosis not present

## 2014-03-01 DIAGNOSIS — S8264XA Nondisplaced fracture of lateral malleolus of right fibula, initial encounter for closed fracture: Secondary | ICD-10-CM | POA: Insufficient documentation

## 2014-03-01 DIAGNOSIS — M25571 Pain in right ankle and joints of right foot: Secondary | ICD-10-CM | POA: Diagnosis not present

## 2014-04-11 ENCOUNTER — Telehealth: Payer: Self-pay | Admitting: Cardiovascular Disease

## 2014-04-11 NOTE — Telephone Encounter (Signed)
Closed encounter °

## 2014-04-23 ENCOUNTER — Other Ambulatory Visit: Payer: Self-pay | Admitting: Cardiovascular Disease

## 2014-04-23 NOTE — Telephone Encounter (Signed)
Rx refill sent to patient pharmacy   

## 2014-04-26 DIAGNOSIS — H52223 Regular astigmatism, bilateral: Secondary | ICD-10-CM | POA: Diagnosis not present

## 2014-04-26 DIAGNOSIS — H5213 Myopia, bilateral: Secondary | ICD-10-CM | POA: Diagnosis not present

## 2014-04-26 DIAGNOSIS — H524 Presbyopia: Secondary | ICD-10-CM | POA: Diagnosis not present

## 2014-04-26 DIAGNOSIS — H3122 Choroidal dystrophy (central areolar) (generalized) (peripapillary): Secondary | ICD-10-CM | POA: Diagnosis not present

## 2014-05-14 DIAGNOSIS — H01133 Eczematous dermatitis of right eye, unspecified eyelid: Secondary | ICD-10-CM | POA: Diagnosis not present

## 2014-05-14 DIAGNOSIS — Z681 Body mass index (BMI) 19 or less, adult: Secondary | ICD-10-CM | POA: Diagnosis not present

## 2014-05-14 DIAGNOSIS — J029 Acute pharyngitis, unspecified: Secondary | ICD-10-CM | POA: Diagnosis not present

## 2014-06-06 ENCOUNTER — Other Ambulatory Visit (INDEPENDENT_AMBULATORY_CARE_PROVIDER_SITE_OTHER): Payer: Self-pay | Admitting: Internal Medicine

## 2014-06-06 ENCOUNTER — Other Ambulatory Visit: Payer: Self-pay | Admitting: Cardiovascular Disease

## 2014-06-06 NOTE — Telephone Encounter (Signed)
Rx(s) sent to pharmacy electronically.  

## 2014-06-07 DIAGNOSIS — E782 Mixed hyperlipidemia: Secondary | ICD-10-CM | POA: Diagnosis not present

## 2014-06-14 ENCOUNTER — Encounter: Payer: Self-pay | Admitting: *Deleted

## 2014-06-18 ENCOUNTER — Encounter (INDEPENDENT_AMBULATORY_CARE_PROVIDER_SITE_OTHER): Payer: Self-pay | Admitting: Internal Medicine

## 2014-06-18 ENCOUNTER — Ambulatory Visit (INDEPENDENT_AMBULATORY_CARE_PROVIDER_SITE_OTHER): Payer: Medicare Other | Admitting: Internal Medicine

## 2014-06-18 VITALS — BP 130/70 | HR 64 | Temp 97.3°F | Resp 18 | Ht 64.0 in | Wt 116.6 lb

## 2014-06-18 DIAGNOSIS — K219 Gastro-esophageal reflux disease without esophagitis: Secondary | ICD-10-CM | POA: Diagnosis not present

## 2014-06-18 NOTE — Progress Notes (Signed)
Presenting complaint;  Follow-up for GERD.  Subjective:  Patient is 81 year old Caucasian female who is here for scheduled visit accompanied by her helper Harriet. She feels she is doing well as frozen GI system is concerned. She denies dysphagia. She occasionally strangles with liquids. She has occasional heartburn. She denies nocturnal regurgitation. Her appetite is fair. She has lost 6 pounds since her last visit. She was able to finish H pylori treatment that was given to her last year. Bowels move daily. Every now and then she may skip a day. She denies melena or rectal bleeding. She fell 6 months ago and sustained fracture fracture to right ankle treated with Ace wrap. She is now using walker when she is not home.   Current Medications: Outpatient Encounter Prescriptions as of 06/18/2014  Medication Sig  . amLODipine-benazepril (LOTREL) 5-40 MG per capsule TAKE (1) CAPSULE BY MOUTH ONCE DAILY.  Marland Kitchen aspirin EC 81 MG tablet Take 81 mg by mouth daily.  Marland Kitchen atenolol (TENORMIN) 25 MG tablet TAKE (1/2) TABLET BY MOUTH TWICE DAILY.  Marland Kitchen BETIMOL 0.5 % ophthalmic solution Place 1 drop into both eyes 2 (two) times daily.   . Calcium Citrate-Vitamin D (CITRACAL + D PO) Take 1 tablet by mouth daily.  . chlordiazePOXIDE (LIBRIUM) 10 MG capsule Take 10 mg by mouth 3 (three) times daily as needed for anxiety.  . Choline Fenofibrate (FENOFIBRIC ACID) 135 MG CPDR TAKE ONE CAPSULE DAILY.  . diphenoxylate-atropine (LOMOTIL) 2.5-0.025 MG per tablet Take 1 tablet by mouth 4 (four) times daily as needed for diarrhea or loose stools.  . fluticasone (FLONASE) 50 MCG/ACT nasal spray 1 spray as needed.   . hydrochlorothiazide (HYDRODIURIL) 25 MG tablet Take 0.5 tablets (12.5 mg total) by mouth daily.  . lansoprazole (PREVACID) 30 MG capsule TAKE 1 CAPSULE 2 TIMES DAILY BEFORE A MEAL AND OTHER MEDICATIONS.  Marland Kitchen levothyroxine (SYNTHROID, LEVOTHROID) 50 MCG tablet Take 50 mcg by mouth daily before breakfast.  . Lutein 20  MG TABS Take 1 tablet by mouth daily.  . meclizine (ANTIVERT) 25 MG tablet Take 25 mg by mouth as needed.  . montelukast (SINGULAIR) 10 MG tablet Take 10 mg by mouth at bedtime.  . Multiple Vitamins-Minerals (CENTRUM SILVER) tablet Take 1 tablet by mouth daily.  . potassium chloride (KLOR-CON) 8 MEQ tablet TAKE (1) TABLET BY MOUTH TWICE DAILY.  Marland Kitchen SALINE NASAL SPRAY NA Place into the nose. Patient states that she uses frequently.  . timolol (TIMOPTIC) 0.5 % ophthalmic solution Place 1 drop into both eyes 2 (two) times daily.  . [DISCONTINUED] ondansetron (ZOFRAN-ODT) 4 MG disintegrating tablet Take 4 mg by mouth.   . [DISCONTINUED] potassium chloride (KLOR-CON) 8 MEQ tablet TAKE (1) TABLET BY MOUTH TWICE DAILY. (Patient not taking: Reported on 06/18/2014)     Objective: Blood pressure 130/70, pulse 64, temperature 97.3 F (36.3 C), temperature source Oral, resp. rate 18, height 5\' 4"  (1.626 m), weight 116 lb 9.6 oz (52.889 kg). Patient is alert and in no acute distress.  Conjunctiva is pink. Sclera is nonicteric Oropharyngeal mucosa is normal. No neck masses or thyromegaly noted. Cardiac exam with regular rhythm normal S1 and S2. Faint systolic ejection murmur noted at aortic area.  Lungs are clear to auscultation. Abdomen is symmetrical soft and nontender without organomegaly or masses.  No LE edema or clubbing noted.   Assessment:  #1. Chronic GERD. She is doing well with double dose PPI. I believe dose could be reduced. Need to make sure she is not  having breakthrough symptoms before prescription changed.   Plan:  Decrease lansoprazole to 30 mg by mouth every morning. If she has to go back to twice a day schedule. Her helper will call office otherwise we'll change prescription renewal. Office visit in one year.

## 2014-06-18 NOTE — Patient Instructions (Signed)
Decrease Prevacid or lansoprazole to 30 mg by mouth 30 minutes before breakfast daily. If you have to go back to twice daily please call office. If once a day works will change prescription at the time of removal.

## 2014-06-22 ENCOUNTER — Ambulatory Visit (INDEPENDENT_AMBULATORY_CARE_PROVIDER_SITE_OTHER): Payer: Medicare Other | Admitting: Cardiovascular Disease

## 2014-06-22 VITALS — BP 158/72 | HR 68 | Ht 64.0 in | Wt 117.3 lb

## 2014-06-22 DIAGNOSIS — I447 Left bundle-branch block, unspecified: Secondary | ICD-10-CM

## 2014-06-22 DIAGNOSIS — E785 Hyperlipidemia, unspecified: Secondary | ICD-10-CM | POA: Diagnosis not present

## 2014-06-22 DIAGNOSIS — I429 Cardiomyopathy, unspecified: Secondary | ICD-10-CM

## 2014-06-22 DIAGNOSIS — I10 Essential (primary) hypertension: Secondary | ICD-10-CM

## 2014-06-22 DIAGNOSIS — K219 Gastro-esophageal reflux disease without esophagitis: Secondary | ICD-10-CM

## 2014-06-22 DIAGNOSIS — I428 Other cardiomyopathies: Secondary | ICD-10-CM

## 2014-06-22 NOTE — Patient Instructions (Signed)
Your physician wants you to follow-up in: 1 year or sooner if needed with Dr. Kelly. You will receive a reminder letter in the mail two months in advance. If you don't receive a letter, please call our office to schedule the follow-up appointment. 

## 2014-06-23 ENCOUNTER — Encounter: Payer: Self-pay | Admitting: Cardiovascular Disease

## 2014-06-23 NOTE — Progress Notes (Signed)
Patient ID: Brittany Wallace, female   DOB: [birth year], 81 y.o.   MRN: [id number]      Primary M.D.: Dr. Benjie Karvonen  HPI: Brittany Wallace is a 81 y.o. female who presents to the office for a one year cardiology evaluation.  Brittany Wallace  has a remote history of a nonischemic cardiomyopathy and in 2001 her ejection fraction was found to be 30%. This subsequently improved and on 07/20/2012 echo Doppler study ejection fraction was 40-45%. She had grade 1 diastolic dysfunction,mild/moderate pulmonary hypertension with a PA pressure of 48 mm, moderate tricuspid regurgitation, moderate mitral regurgitation with mitral annular calcification. She also has a history of left bundle branch block, hypertension, as well as hyperlipidemia.  Over the past year, she has continued to do fairly well.  She denies any significant chest pain or palpitations.  She does have GERD and recently her Prevacid was changed to just 1 capsule daily by Dr. Claiborne Rigg.  In the past.  She did develop increased peripheral edema on amlodipine when her dose was at 10 mg, which did improve with dose reduction to 5 mg.  Last year she sustained a right heel fracture.  She has difficulty hearing and uses a hearing aid.  She has a history of hypothyroidism.  She has been on fenofibrate therapy.  She is unaware of PND orthopnea.  She presents for your evaluation  Past Medical History  Diagnosis Date  . Hypertension   . Myocardial infarct     1995-1996  . Hypothyroidism   . Hyperlipidemia   . Nonischemic cardiomyopathy   . LBBB (left bundle branch block)   . Aortic stenosis     Past Surgical History  Procedure Laterality Date  . Abdominal hysterectomy    . Eye surgery    . Cholecystectomy      Allergies  Allergen Reactions  . Aspirin     High doses.  Mack Hook [Levofloxacin In D5w]     Fuzzy headed  . Pantoprazole     Current Outpatient Prescriptions  Medication Sig Dispense Refill  . amLODipine-benazepril (LOTREL) 5-40 MG per  capsule TAKE (1) CAPSULE BY MOUTH ONCE DAILY. 30 capsule 6  . aspirin EC 81 MG tablet Take 81 mg by mouth daily.    Marland Kitchen atenolol (TENORMIN) 25 MG tablet TAKE (1/2) TABLET BY MOUTH TWICE DAILY. 60 tablet 8  . BETIMOL 0.5 % ophthalmic solution Place 1 drop into both eyes 2 (two) times daily.     . Calcium Citrate-Vitamin D (CITRACAL + D PO) Take 1 tablet by mouth daily.    . chlordiazePOXIDE (LIBRIUM) 10 MG capsule Take 10 mg by mouth 3 (three) times daily as needed for anxiety.    . Choline Fenofibrate (FENOFIBRIC ACID) 135 MG CPDR TAKE ONE CAPSULE DAILY. 30 capsule 11  . diphenoxylate-atropine (LOMOTIL) 2.5-0.025 MG per tablet Take 1 tablet by mouth 4 (four) times daily as needed for diarrhea or loose stools.    . fluticasone (FLONASE) 50 MCG/ACT nasal spray 1 spray as needed.     . hydrochlorothiazide (HYDRODIURIL) 25 MG tablet Take 0.5 tablets (12.5 mg total) by mouth daily. 30 tablet 3  . lansoprazole (PREVACID) 30 MG capsule TAKE 1 CAPSULE 2 TIMES DAILY BEFORE A MEAL AND OTHER MEDICATIONS. (Patient taking differently: TAKE 1 CAPSULE 1 TIMES DAILY BEFORE A MEAL AND OTHER MEDICATIONS.) 60 capsule 11  . levothyroxine (SYNTHROID, LEVOTHROID) 50 MCG tablet Take 50 mcg by mouth daily before breakfast.    . Lutein 20 MG TABS Take  1 tablet by mouth daily.    . meclizine (ANTIVERT) 25 MG tablet Take 25 mg by mouth as needed.    . montelukast (SINGULAIR) 10 MG tablet Take 10 mg by mouth at bedtime.    . Multiple Vitamins-Minerals (CENTRUM SILVER) tablet Take 1 tablet by mouth daily.    . potassium chloride (KLOR-CON) 8 MEQ tablet TAKE (1) TABLET BY MOUTH TWICE DAILY. 60 tablet 3  . SALINE NASAL SPRAY NA Place into the nose. Patient states that she uses frequently.    . timolol (TIMOPTIC) 0.5 % ophthalmic solution Place 1 drop into both eyes 2 (two) times daily.     No current facility-administered medications for this visit.    History   Social History  . Marital Status: Single    Spouse Name: N/A   . Number of Children: N/A  . Years of Education: N/A   Occupational History  . Not on file.   Social History Main Topics  . Smoking status: Never Smoker   . Smokeless tobacco: Never Used  . Alcohol Use: No  . Drug Use: No  . Sexual Activity: No   Other Topics Concern  . Not on file   Social History Narrative      Socially she is a single has no children. No tobacco alcohol use  Family History  Problem Relation Age of Onset  . Heart attack Father   . Hypertension Brother   . Cancer Sister     ROS General: Negative; No fevers, chills, or night sweats;  HEENT: Positive for decreased hearing, she uses a hearing aid. no sinus congestion, difficulty swallowing Pulmonary: Negative; No cough, wheezing, shortness of breath, hemoptysis Cardiovascular: See history of present illness  ankle swelling has improved GI: Negative; No nausea, vomiting, diarrhea, or abdominal pain GU: Negative; No dysuria, hematuria, or difficulty voiding Musculoskeletal: Negative; no myalgias, joint pain, or weakness Hematologic/Oncology: Negative; no easy bruising, bleeding Endocrine: Negative; no heat/cold intolerance Neuro: Mild vertigo; no , headaches Skin: Negative; No rashes or skin lesions Psychiatric: Negative; No behavioral problems, depression Sleep: Negative; No snoring, daytime sleepiness, hypersomnolence, bruxism, restless legs, hypnogognic hallucinations, no cataplexy  Other comprehensive 14 point system review is negative.  PE BP 158/72 mmHg  Pulse 68  Ht 5' 4" (1.626 m)  Wt 117 lb 4.8 oz (53.207 kg)  BMI 20.12 kg/m2  Repeat blood pressure was 140/70 General: Alert, oriented, no distress.  Appears younger than stated age Skin: normal turgor, no rashes HEENT: Normocephalic, atraumatic. Pupils round and reactive; sclera anicteric;no lid lag.  Nose without nasal septal hypertrophy Mouth/Parynx benign; Mallinpatti scale 2 Neck: No JVD, no carotid bruis , with normal carotid  upstroke Chest wall: Nontender to palpation Lungs: clear to ausculatation and percussion; no wheezing or rales Heart: RRR, s1 s2 normal 2/6 murmur in the aortic region. Split S2.  No S3 or S4 gallop.  No diastolic murmur, rubs, thrills or heaves. Abdomen: soft, nontender; no hepatosplenomehaly, BS+; abdominal aorta nontender and not dilated by palpation. Back: No CVA Pulses 2+ Extremities: Resolution of prior ankle edema;no clubbing cyanosis, Homan's sign negative  Neurologic: grossly nonfocal Psychological: Normal affect and mood  ECG (independently read by me): Normal sinus rhythm at 68 bpm.  Left bundle branch block with repolarization changes.  April 2015 ECG (independently read by me): Sinus bradycardia 54 beats per minute.  Left bundle branch block with repolarization changes.  Prior 12/28/2012 ECG: Sinus rhythm with left axis deviation and evidence for left bundle branch block. Rate  70 beats per minute.  LABS:  BMET  BMP Latest Ref Rng 12/14/2013 10/30/2013 07/24/2013  Glucose 70 - 99 mg/dL 90 - 94  BUN 6 - 23 mg/dL 19 - 13  Creatinine 0.50 - 1.10 mg/dL 0.98 0.70 0.66  Sodium 135 - 145 mEq/L 135 - 140  Potassium 3.5 - 5.3 mEq/L 4.4 - 4.4  Chloride 96 - 112 mEq/L 104 - 105  CO2 19 - 32 mEq/L 25 - 26  Calcium 8.4 - 10.5 mg/dL 9.0 - 9.8    Hepatic Function Panel   Hepatic Function Latest Ref Rng 12/14/2013 07/24/2013 07/07/2012  Total Protein 6.0 - 8.3 g/dL 7.0 7.4 7.8  Albumin 3.5 - 5.2 g/dL 4.1 4.6 4.2  AST 0 - 37 U/L _0 ALT 0 - 35 U/L _1 Alk Phosphatase 39 - 117 U/L 34(L) 40 48  Total Bilirubin 0.2 - 1.2 mg/dL 0.8 0.8 0.6     CBC  CBC Latest Ref Rng 12/14/2013 07/24/2013 07/07/2012  WBC 4.0 - 10.5 K/uL 4.7 4.1 4.7  Hemoglobin 12.0 - 15.0 g/dL 12.9 13.2 13.0  Hematocrit 36.0 - 46.0 % 39.0 39.4 36.0  Platelets 150 - 400 K/uL 270 250 241    BNP No results found for: PROBNP    Lipid Panel     Component Value Date/Time   CHOL 140 07/24/2013 0933    TRIG 142 07/24/2013 0933   HDL 32* 07/24/2013 0933   CHOLHDL 4.4 07/24/2013 0933   VLDL 28 07/24/2013 0933   LDLCALC 80 07/24/2013 0933     RADIOLOGY: No results found.    ASSESSMENT AND PLAN: Brittany Wallace is a 81 year old female who has a history of hypertension, GERD, hyperlipidemia, hypothyroidism and has a remote history of a cardiomyopathy.  Her LV function has improved to approximately 45% on her last echo Doppler study of April 2014.  She did have moderate pulmonary hypertension with a PA pressure estimated at 48 mm. her prior leg swelling improved with reducing her amlodipine dose.  Her blood pressure today was initially elevated when taken by the nurse at 158/72, but this had improved to 140/70 when taken by me.  She will continue taking amlodipine 5 mg in addition to her but as a role in her Lotrel 5/40 combination.  She is tolerating atenolol at just 12.5 g twice a day with resting pulse in the 60s.  Her left bundle branch block is stable.  Her reflux is controlled with her, reduced dose of Prevacid.  She has rarely needed, Antivert for her prior history of vertigo.  She continues to take 12.5 mg of HCTZ without significant edema.  She will be having laboratory by her primary care physician, Dr. Ethlyn Gallery.  I will ask that these be sent to me for my review.  As long as she remains stable, I will see her in one year for cardiology reevaluation.  Time spent: 25 minutes  Troy Sine, MD, [facility]  06/23/2014 3:16 PM

## 2014-07-05 ENCOUNTER — Other Ambulatory Visit: Payer: Self-pay | Admitting: Cardiovascular Disease

## 2014-09-18 ENCOUNTER — Other Ambulatory Visit: Payer: Self-pay | Admitting: Cardiovascular Disease

## 2014-09-18 NOTE — Telephone Encounter (Signed)
REFILL 

## 2014-10-17 ENCOUNTER — Other Ambulatory Visit: Payer: Self-pay | Admitting: Cardiovascular Disease

## 2014-11-09 DIAGNOSIS — H6501 Acute serous otitis media, right ear: Secondary | ICD-10-CM | POA: Diagnosis not present

## 2014-11-09 DIAGNOSIS — H6121 Impacted cerumen, right ear: Secondary | ICD-10-CM | POA: Diagnosis not present

## 2014-11-09 DIAGNOSIS — J3081 Allergic rhinitis due to animal (cat) (dog) hair and dander: Secondary | ICD-10-CM | POA: Diagnosis not present

## 2014-11-09 DIAGNOSIS — H6981 Other specified disorders of Eustachian tube, right ear: Secondary | ICD-10-CM | POA: Diagnosis not present

## 2014-11-30 DIAGNOSIS — R509 Fever, unspecified: Secondary | ICD-10-CM | POA: Diagnosis not present

## 2014-11-30 DIAGNOSIS — H6501 Acute serous otitis media, right ear: Secondary | ICD-10-CM | POA: Diagnosis not present

## 2014-11-30 DIAGNOSIS — J3081 Allergic rhinitis due to animal (cat) (dog) hair and dander: Secondary | ICD-10-CM | POA: Diagnosis not present

## 2014-11-30 DIAGNOSIS — H6981 Other specified disorders of Eustachian tube, right ear: Secondary | ICD-10-CM | POA: Diagnosis not present

## 2014-12-07 DIAGNOSIS — E559 Vitamin D deficiency, unspecified: Secondary | ICD-10-CM | POA: Diagnosis not present

## 2014-12-07 DIAGNOSIS — E782 Mixed hyperlipidemia: Secondary | ICD-10-CM | POA: Diagnosis not present

## 2014-12-07 DIAGNOSIS — E039 Hypothyroidism, unspecified: Secondary | ICD-10-CM | POA: Diagnosis not present

## 2014-12-07 DIAGNOSIS — Z681 Body mass index (BMI) 19 or less, adult: Secondary | ICD-10-CM | POA: Diagnosis not present

## 2014-12-07 DIAGNOSIS — Z1389 Encounter for screening for other disorder: Secondary | ICD-10-CM | POA: Diagnosis not present

## 2014-12-07 DIAGNOSIS — E538 Deficiency of other specified B group vitamins: Secondary | ICD-10-CM | POA: Diagnosis not present

## 2014-12-07 DIAGNOSIS — Z79899 Other long term (current) drug therapy: Secondary | ICD-10-CM | POA: Diagnosis not present

## 2014-12-07 DIAGNOSIS — H6991 Unspecified Eustachian tube disorder, right ear: Secondary | ICD-10-CM | POA: Diagnosis not present

## 2014-12-14 DIAGNOSIS — H903 Sensorineural hearing loss, bilateral: Secondary | ICD-10-CM | POA: Diagnosis not present

## 2014-12-14 DIAGNOSIS — H93291 Other abnormal auditory perceptions, right ear: Secondary | ICD-10-CM | POA: Diagnosis not present

## 2014-12-19 ENCOUNTER — Other Ambulatory Visit: Payer: Self-pay | Admitting: Cardiovascular Disease

## 2014-12-19 NOTE — Telephone Encounter (Signed)
Rx request sent to pharmacy.  

## 2015-01-16 DIAGNOSIS — H401133 Primary open-angle glaucoma, bilateral, severe stage: Secondary | ICD-10-CM | POA: Diagnosis not present

## 2015-02-04 DIAGNOSIS — Z23 Encounter for immunization: Secondary | ICD-10-CM | POA: Diagnosis not present

## 2015-02-12 DIAGNOSIS — H401131 Primary open-angle glaucoma, bilateral, mild stage: Secondary | ICD-10-CM | POA: Diagnosis not present

## 2015-02-18 ENCOUNTER — Other Ambulatory Visit: Payer: Self-pay | Admitting: Cardiovascular Disease

## 2015-04-03 ENCOUNTER — Encounter (INDEPENDENT_AMBULATORY_CARE_PROVIDER_SITE_OTHER): Payer: Self-pay | Admitting: Internal Medicine

## 2015-05-14 DIAGNOSIS — G4452 New daily persistent headache (NDPH): Secondary | ICD-10-CM | POA: Diagnosis not present

## 2015-05-14 DIAGNOSIS — H6521 Chronic serous otitis media, right ear: Secondary | ICD-10-CM | POA: Diagnosis not present

## 2015-05-18 ENCOUNTER — Emergency Department (HOSPITAL_COMMUNITY): Payer: Medicare Other

## 2015-05-18 ENCOUNTER — Encounter (HOSPITAL_COMMUNITY): Payer: Self-pay

## 2015-05-18 ENCOUNTER — Emergency Department (HOSPITAL_COMMUNITY)
Admission: EM | Admit: 2015-05-18 | Discharge: 2015-05-18 | Disposition: A | Discharge status: Home / Self Care | Payer: Medicare Other | Attending: Emergency Medicine | Admitting: Emergency Medicine

## 2015-05-18 DIAGNOSIS — R404 Transient alteration of awareness: Secondary | ICD-10-CM | POA: Diagnosis not present

## 2015-05-18 DIAGNOSIS — I252 Old myocardial infarction: Secondary | ICD-10-CM | POA: Insufficient documentation

## 2015-05-18 DIAGNOSIS — E785 Hyperlipidemia, unspecified: Secondary | ICD-10-CM | POA: Diagnosis not present

## 2015-05-18 DIAGNOSIS — E039 Hypothyroidism, unspecified: Secondary | ICD-10-CM | POA: Insufficient documentation

## 2015-05-18 DIAGNOSIS — Z888 Allergy status to other drugs, medicaments and biological substances status: Secondary | ICD-10-CM | POA: Insufficient documentation

## 2015-05-18 DIAGNOSIS — Z79899 Other long term (current) drug therapy: Secondary | ICD-10-CM | POA: Insufficient documentation

## 2015-05-18 DIAGNOSIS — R531 Weakness: Secondary | ICD-10-CM | POA: Diagnosis not present

## 2015-05-18 DIAGNOSIS — R7989 Other specified abnormal findings of blood chemistry: Secondary | ICD-10-CM | POA: Insufficient documentation

## 2015-05-18 DIAGNOSIS — I10 Essential (primary) hypertension: Secondary | ICD-10-CM | POA: Diagnosis not present

## 2015-05-18 DIAGNOSIS — R42 Dizziness and giddiness: Secondary | ICD-10-CM | POA: Diagnosis not present

## 2015-05-18 DIAGNOSIS — R2681 Unsteadiness on feet: Secondary | ICD-10-CM | POA: Diagnosis not present

## 2015-05-18 LAB — CBC WITH DIFFERENTIAL/PLATELET
BASOS ABS: 0.1 10*3/uL (ref 0.0–0.1)
BASOS PCT: 1 %
EOS PCT: 4 %
Eosinophils Absolute: 0.3 10*3/uL (ref 0.0–0.7)
HEMATOCRIT: 39.4 % (ref 36.0–46.0)
Hemoglobin: 13.6 g/dL (ref 12.0–15.0)
LYMPHS PCT: 16 %
Lymphs Abs: 1 10*3/uL (ref 0.7–4.0)
MCH: 31.3 pg (ref 26.0–34.0)
MCHC: 34.5 g/dL (ref 30.0–36.0)
MCV: 90.6 fL (ref 78.0–100.0)
MONOS PCT: 8 %
Monocytes Absolute: 0.5 10*3/uL (ref 0.1–1.0)
NEUTROS ABS: 4.5 10*3/uL (ref 1.7–7.7)
Neutrophils Relative %: 71 %
PLATELETS: 265 10*3/uL (ref 150–400)
RBC: 4.35 MIL/uL (ref 3.87–5.11)
RDW: 13.9 % (ref 11.5–15.5)
WBC: 6.3 10*3/uL (ref 4.0–10.5)

## 2015-05-18 LAB — BRAIN NATRIURETIC PEPTIDE: B Natriuretic Peptide: 874 pg/mL — ABNORMAL HIGH (ref 0.0–100.0)

## 2015-05-18 LAB — COMPREHENSIVE METABOLIC PANEL
ALK PHOS: 42 U/L (ref 38–126)
ALT: 13 U/L — ABNORMAL LOW (ref 14–54)
AST: 22 U/L (ref 15–41)
Albumin: 3.8 g/dL (ref 3.5–5.0)
Anion gap: 8 (ref 5–15)
BUN: 10 mg/dL (ref 6–20)
CO2: 21 mmol/L — ABNORMAL LOW (ref 22–32)
Calcium: 8 mg/dL — ABNORMAL LOW (ref 8.9–10.3)
Chloride: 101 mmol/L (ref 101–111)
Creatinine, Ser: 0.47 mg/dL (ref 0.44–1.00)
GFR calc Af Amer: >60 mL/min (ref >60)
GFR calc non Af Amer: >60 mL/min (ref >60)
GLUCOSE: 107 mg/dL — AB (ref 65–99)
POTASSIUM: 3.7 mmol/L (ref 3.5–5.1)
Sodium: 130 mmol/L — ABNORMAL LOW (ref 135–145)
TOTAL PROTEIN: 6.8 g/dL (ref 6.5–8.1)
Total Bilirubin: 0.7 mg/dL (ref 0.3–1.2)

## 2015-05-18 LAB — URINALYSIS, ROUTINE W REFLEX MICROSCOPIC
Bilirubin Urine: NEGATIVE
Glucose, UA: NEGATIVE mg/dL
Hgb urine dipstick: NEGATIVE
KETONES UR: NEGATIVE mg/dL
Leukocytes, UA: NEGATIVE
Nitrite: NEGATIVE
PROTEIN: NEGATIVE mg/dL
Specific Gravity, Urine: 1.01 (ref 1.005–1.030)
pH: 7.5 (ref 5.0–8.0)

## 2015-05-18 MED ORDER — FOSFOMYCIN TROMETHAMINE 3 G PO PACK
PACK | ORAL | Status: AC
  Filled 2015-05-18: qty 3

## 2015-05-18 MED ORDER — SODIUM CHLORIDE 0.9 % IV BOLUS (SEPSIS)
500.0000 mL | Freq: Once | INTRAVENOUS | Status: AC
  Administered 2015-05-18: 500 mL via INTRAVENOUS

## 2015-05-18 MED ORDER — FOSFOMYCIN TROMETHAMINE 3 G PO PACK
3.0000 g | PACK | Freq: Once | ORAL | Status: AC
  Administered 2015-05-18: 3 g via ORAL
  Filled 2015-05-18: qty 3

## 2015-05-18 MED ORDER — FUROSEMIDE 20 MG PO TABS: 20.0000 mg | ORAL_TABLET | Freq: Every day | ORAL | Status: DC

## 2015-05-18 MED ORDER — FUROSEMIDE 40 MG PO TABS
20.0000 mg | ORAL_TABLET | Freq: Once | ORAL | Status: AC
  Administered 2015-05-18: 20 mg via ORAL
  Filled 2015-05-18: qty 1

## 2015-05-18 NOTE — Discharge Instructions (Signed)
As discussed, it is very important follow-up with your cardiologist in the coming days, for consideration of repeat echocardiogram, and to insure that your medication regimen is appropriate.  Please take all medication as directed, and return here for concerning changes in your condition.

## 2015-05-18 NOTE — ED Provider Notes (Addendum)
CSN: [id number]     Arrival date & time 05/18/15  1219 History   By signing my name below, I, Altamease Oiler, attest that this documentation has been prepared under the direction and in the presence of Carmin Muskrat, MD. Electronically Signed: Altamease Oiler, ED Scribe. 05/18/2015. 12:51 PM   Chief Complaint  Patient presents with  . Dizziness   The history is provided by the patient. No language interpreter was used.   Brittany Wallace is a 82 y.o. female with history of MI, HTN, and hypothyroidism who presents to the Emergency Department complaining of increasing off-balance dizziness with onset 2 weeks ago. The sensation is present with upright positioning.  Pt states that she feels unsteady and as if she would fall if not for her walker. Associated symptoms include a feeling of fullness in the head. She is currently on cefuroxime for a right ear infection. Pt denies falling, confusion, weakness, chest pain, SOB, and abdominal pain.   Past Medical History  Diagnosis Date  . Hypertension   . Myocardial infarct ([place])     [id number]  . Hypothyroidism   . Hyperlipidemia   . Nonischemic cardiomyopathy ([place])   . LBBB (left bundle branch block)   . Aortic stenosis    Past Surgical History  Procedure Laterality Date  . Abdominal hysterectomy    . Eye surgery    . Cholecystectomy     Family History  Problem Relation Age of Onset  . Heart attack Father   . Hypertension Brother   . Cancer Sister    Social History  Substance Use Topics  . Smoking status: Never Smoker   . Smokeless tobacco: Never Used  . Alcohol Use: No   OB History    No data available     Review of Systems  Constitutional:       Per HPI, otherwise negative  HENT:       Per HPI, otherwise negative  Respiratory:       Per HPI, otherwise negative  Cardiovascular:       Per HPI, otherwise negative  Gastrointestinal: Negative for vomiting and abdominal pain.  Endocrine:       Negative aside from HPI   Genitourinary:       Neg aside from HPI   Musculoskeletal:       Per HPI, otherwise negative  Skin: Negative.   Neurological: Positive for dizziness and headaches ("fullness"). Negative for syncope.  Psychiatric/Behavioral: Negative for confusion.    Allergies  Aspirin; Levaquin; and Pantoprazole  Home Medications   Prior to Admission medications   Medication Sig Start Date End Date Taking? Authorizing Provider  amLODipine-benazepril (LOTREL) 5-40 MG per capsule TAKE (1) CAPSULE BY MOUTH ONCE DAILY. 10/17/14   Troy Sine, MD  aspirin EC 81 MG tablet Take 81 mg by mouth daily.    Historical Provider, MD  atenolol (TENORMIN) 25 MG tablet TAKE (1/2) TABLET BY MOUTH TWICE DAILY. 12/19/14   Troy Sine, MD  BETIMOL 0.5 % ophthalmic solution Place 1 drop into both eyes 2 (two) times daily.  06/06/14   Historical Provider, MD  Calcium Citrate-Vitamin D (CITRACAL + D PO) Take 1 tablet by mouth daily.    Historical Provider, MD  chlordiazePOXIDE (LIBRIUM) 10 MG capsule Take 10 mg by mouth 3 (three) times daily as needed for anxiety.    Historical Provider, MD  Choline Fenofibrate (FENOFIBRIC ACID) 135 MG CPDR TAKE ONE CAPSULE BY MOUTH ONCE DAILY. 09/18/14   Troy Sine, MD  diphenoxylate-atropine (LOMOTIL) 2.5-0.025 MG per tablet Take 1 tablet by mouth 4 (four) times daily as needed for diarrhea or loose stools.    Historical Provider, MD  fluticasone (FLONASE) 50 MCG/ACT nasal spray 1 spray as needed.  04/10/14   Historical Provider, MD  hydrochlorothiazide (HYDRODIURIL) 25 MG tablet Take 0.5 tablets (12.5 mg total) by mouth daily. 12/16/12   Troy Sine, MD  lansoprazole (PREVACID) 30 MG capsule TAKE 1 CAPSULE 2 TIMES DAILY BEFORE A MEAL AND OTHER MEDICATIONS. Patient taking differently: TAKE 1 CAPSULE 1 TIMES DAILY BEFORE A MEAL AND OTHER MEDICATIONS. 06/07/14   Butch Penny, NP  levothyroxine (SYNTHROID, LEVOTHROID) 50 MCG tablet Take 50 mcg by mouth daily before breakfast.     Historical Provider, MD  Lutein 20 MG TABS Take 1 tablet by mouth daily.    Historical Provider, MD  meclizine (ANTIVERT) 25 MG tablet Take 25 mg by mouth as needed. 11/08/12   Historical Provider, MD  montelukast (SINGULAIR) 10 MG tablet Take 10 mg by mouth at bedtime.    Historical Provider, MD  Multiple Vitamins-Minerals (CENTRUM SILVER) tablet Take 1 tablet by mouth daily.    Historical Provider, MD  potassium chloride (KLOR-CON) 8 MEQ tablet TAKE (1) TABLET BY MOUTH TWICE DAILY. 02/08/14   Troy Sine, MD  potassium chloride (KLOR-CON) 8 MEQ tablet TAKE (1) TABLET BY MOUTH TWICE DAILY. 07/05/14   Troy Sine, MD  potassium chloride (KLOR-CON) 8 MEQ tablet TAKE (1) TABLET BY MOUTH TWICE DAILY. 02/18/15   Troy Sine, MD  SALINE NASAL SPRAY NA Place into the nose. Patient states that she uses frequently.    Historical Provider, MD  timolol (TIMOPTIC) 0.5 % ophthalmic solution Place 1 drop into both eyes 2 (two) times daily.    Historical Provider, MD   BP 176/83 mmHg  Pulse 64  Temp(Src) 97.8 F (36.6 C) (Oral)  Resp 18  SpO2 96% Physical Exam  Constitutional: She is oriented to person, place, and time. She appears well-developed and well-nourished. No distress.  HENT:  Head: Normocephalic and atraumatic.  Right ear without obvious signs of infection but with middle ear effusion.   Eyes: Conjunctivae and EOM are normal.  Cardiovascular: Normal rate and regular rhythm.   Murmur heard. Very audible aortic murmur   Pulmonary/Chest: Effort normal and breath sounds normal. No stridor. No respiratory distress. Tenderness: .scribe.  Abdominal: She exhibits no distension.  Musculoskeletal: She exhibits no edema.  Neurological: She is alert and oriented to person, place, and time. No cranial nerve deficit.  Skin: Skin is warm and dry.  Psychiatric: She has a normal mood and affect.  Nursing note and vitals reviewed.   ED Course  Procedures (including critical care  time) DIAGNOSTIC STUDIES: Oxygen Saturation is 96% on RA,  normal by my interpretation.    COORDINATION OF CARE: 12:26 PM Discussed treatment plan which includes lab work, CT head without contrast, EKG, and IVF with pt at bedside and pt agreed to plan.  Labs Review Labs Reviewed  COMPREHENSIVE METABOLIC PANEL - Abnormal; Notable for the following:    Sodium 130 (*)    CO2 21 (*)    Glucose, Bld 107 (*)    Calcium 8.0 (*)    ALT 13 (*)    All other components within normal limits  BRAIN NATRIURETIC PEPTIDE - Abnormal; Notable for the following:    B Natriuretic Peptide 874.0 (*)    All other components within normal limits  CBC WITH DIFFERENTIAL/PLATELET  URINALYSIS, ROUTINE W REFLEX MICROSCOPIC (NOT AT [facility])    Imaging Review Ct Head Wo Contrast  05/18/2015  CLINICAL DATA:  Increasingly off balance. Dizziness. Onset 2 weeks ago. EXAM: CT HEAD WITHOUT CONTRAST TECHNIQUE: Contiguous axial images were obtained from the base of the skull through the vertex without contrast. COMPARISON:  Head CT 09/17/2008 and MRI 10/30/2013 FINDINGS: Again noted is a calcified 1 cm right frontal meningioma. No evidence for acute hemorrhage, midline shift, hydrocephalus or large infarct. Opacification of the right mastoid air cells. There is also fluid in the left mastoid air cells. The visualized portions of the right maxillary sinus are completely opacified. There is fluid in left maxillary sinus. There is opacification of the anterior right ethmoid air cells and right frontal sinuses. No calvarial fracture. IMPRESSION: No acute intracranial abnormality. Extensive paranasal sinus disease involving the right maxillary sinus, ethmoid air cells and right frontal sinus. In addition, there is fluid in the mastoid air cells, right side greater than left. Stable right frontal meningioma. Electronically Signed   By: Markus Daft M.D.   On: 05/18/2015 13:02   I have personally reviewed and evaluated these images and lab  results as part of my medical decision-making.   EKG Interpretation   Date/Time:  Saturday May 18 2015 13:15:47 EST Ventricular Rate:  72 PR Interval:  212 QRS Duration: 167 QT Interval:  462 QTC Calculation: 506 R Axis:   -75 Text Interpretation:  Sinus rhythm Borderline prolonged PR interval  Probable left atrial enlargement Left bundle branch block Baseline wander  in lead(s) V6 Sinus rhythm Left bundle branch block T wave abnormality  Abnormal ekg Confirmed by Carmin Muskrat  MD ([id number]) on 05/18/2015 2:26:24  PM       ECHO 2014 w EF 40-45%  On repeat exam the patient remained in similar condition. Given the patient's elevated BNP, prior echocardiogram, with diminished ejection fraction, and her audible aortic murmur, concerning for aortic stenosis. Patient states that she stopped taking her water pill, so for no clear reason.  We discussed the need for initiation of diuretic, repeat echocardiogram, in the next few days. Daughter states that this is reasonable to execute as an outpatient.    MDM    I personally performed the services described in this documentation, which was scribed in my presence. The recorded information has been reviewed and is accurate.    Elderly female presents with positional lightheadedness, unsteadiness. Patient has notable history of cardiomyopathy, and physical exam is concerning for aortic stenosis as well. At rest, the patient is minimally symptomatic.  Patient was started on a diuretic, which she had previously stopped, and will follow-up closely with cardiology for echocardiogram, repeat evaluation in the coming days.    Carmin Muskrat, MD 05/18/15 [phone]

## 2015-05-18 NOTE — ED Notes (Signed)
Awaiting Monurol from [facility]

## 2015-05-18 NOTE — ED Notes (Signed)
Pt c/o of feeling "wobbling" when she stands up for the last month.

## 2015-05-21 NOTE — Progress Notes (Signed)
Cardiology Office Note   Date:  05/23/2015   ID:  Brittany Wallace, DOB 06-27-1920, MRN [id number]  PCP:  Rocky Morel, MD  Cardiologist:  Dr. Brett Canales ER follow up- pre syncope.    History of Present Illness: Brittany Wallace is a 82 y.o. female with a history of LBBB, HTN, HLD, NICM/chronic systolic CHF ( EF [id number]), alzheimer dementia, and mild AS/AR and mod pulm HTN who presents to clinic for evaluation of presyncope.   She has a remote history of a nonischemic cardiomyopathy and in 2001 her ejection fraction was found to be 30%. This subsequently improved and on 07/20/2012 echo Doppler study ejection fraction was 40-45%. She had grade 1 diastolic dysfunction, mild/moderate pulmonary hypertension with a PA pressure of 48 mm, moderate tricuspid regurgitation, moderate mitral regurgitation with mitral annular calcification  She presented to [facility] emergency department on 05/18/15 complaining of increased off-balance dizziness 2 weeks. Her BNP was noted to be elevated to 874 and there was a murmur noted on physical exam. The ED provider was worried about possible worsening aortic stenosis and she was deferred to follow-up with cardiology for further workup. She was released from the emergency department the same day on a newly prescribed diuretic (Lasix 20mg  daily). She had previously been on HCTZ 12.5 mg but had stopped this for unclear reasons.  Today she presents to clinic for follow-up. She does a lot of house work and lives on her own with help from a nurse. Since last week she has been noticing that after standing a while and with any amount of exertion that she feels like she is going to pass out and has to sit down. No CP or SOB. No LE edema, palpitations or syncope. She just goes down to rest afterwards and feels a little better. She is a difficult historian due to dementia. She also has a history of inner ear issues and was on meclizine but states that she doesn't really feel  "dizzY" just lightheaded like she is going to pass out. Additionally, she has been taking both the lasix and HCTZ     Past Medical History  Diagnosis Date  . Hypertension   . Myocardial infarct ([place])     [id number]  . Hypothyroidism   . Hyperlipidemia   . Nonischemic cardiomyopathy ([place])   . LBBB (left bundle branch block)   . Aortic stenosis     Past Surgical History  Procedure Laterality Date  . Abdominal hysterectomy    . Eye surgery    . Cholecystectomy       Current Outpatient Prescriptions  Medication Sig Dispense Refill  . amLODipine-benazepril (LOTREL) 5-40 MG per capsule TAKE (1) CAPSULE BY MOUTH ONCE DAILY. 30 capsule 6  . aspirin EC 81 MG tablet Take 81 mg by mouth daily.    Marland Kitchen atenolol (TENORMIN) 25 MG tablet TAKE (1/2) TABLET BY MOUTH TWICE DAILY. 60 tablet 6  . BETIMOL 0.5 % ophthalmic solution Place 1 drop into both eyes 2 (two) times daily.     . Calcium Citrate-Vitamin D (CITRACAL + D PO) Take 1 tablet by mouth daily.    . chlordiazePOXIDE (LIBRIUM) 10 MG capsule Take 10 mg by mouth 3 (three) times daily as needed for anxiety.    . Choline Fenofibrate (FENOFIBRIC ACID) 135 MG CPDR TAKE ONE CAPSULE BY MOUTH ONCE DAILY. 30 capsule 11  . diphenoxylate-atropine (LOMOTIL) 2.5-0.025 MG per tablet Take 1 tablet by mouth 4 (four) times daily as needed  for diarrhea or loose stools.    . fluticasone (FLONASE) 50 MCG/ACT nasal spray 1 spray as needed.     . furosemide (LASIX) 20 MG tablet Take 1 tablet (20 mg total) by mouth daily. 21 tablet 0  . lansoprazole (PREVACID) 30 MG capsule Take 30 mg by mouth 2 (two) times daily.     Marland Kitchen levothyroxine (SYNTHROID, LEVOTHROID) 50 MCG tablet Take 50 mcg by mouth daily before breakfast.    . Lutein 20 MG TABS Take 1 tablet by mouth daily.    . meclizine (ANTIVERT) 25 MG tablet Take 25 mg by mouth as needed.    . montelukast (SINGULAIR) 10 MG tablet Take 10 mg by mouth at bedtime.    . Multiple Vitamins-Minerals (CENTRUM SILVER)  tablet Take 1 tablet by mouth daily.    . potassium chloride (KLOR-CON) 8 MEQ tablet TAKE (1) TABLET BY MOUTH TWICE DAILY. 60 tablet 3  . SALINE NASAL SPRAY NA Place into the nose. Patient states that she uses frequently.    . timolol (TIMOPTIC) 0.5 % ophthalmic solution Place 1 drop into both eyes 2 (two) times daily.    Marland Kitchen amLODipine (NORVASC) 5 MG tablet Take 1 tablet (5 mg total) by mouth daily. 90 tablet 3   No current facility-administered medications for this visit.    Allergies:   Aspirin; Levaquin; and Pantoprazole    Social History:  The patient  reports that she has never smoked. She has never used smokeless tobacco. She reports that she does not drink alcohol or use illicit drugs.   Family History:  The patient's family history includes Cancer in her sister; Heart attack in her father; Hypertension in her brother.    ROS:  Please see the history of present illness.   Otherwise, review of systems are positive for none.   All other systems are reviewed and negative.    PHYSICAL EXAM: VS:  BP 178/66 mmHg  Pulse 64  Ht 5\' 4"  (1.626 m)  Wt 115 lb 6.4 oz (52.345 kg)  BMI 19.80 kg/m2 , BMI Body mass index is 19.8 kg/(m^2). GEN: Well nourished, well developed, in no acute distress HEENT: normal Neck: no JVD, carotid bruits, or masses Cardiac: RRR; + murmurs, rubs, or gallops,no edema  Respiratory:  clear to auscultation bilaterally, normal work of breathing GI: soft, nontender, nondistended, + BS MS: no deformity or atrophy Skin: warm and dry, no rash Neuro:  Strength and sensation are intact Psych: euthymic mood, full affect   EKG:  EKG is ordered today. The ekg ordered today demonstrates LBBB, NSR HR 64   Recent Labs: 05/18/2015: ALT 13*; B Natriuretic Peptide 874.0*; BUN 10; Creatinine, Ser 0.47; Hemoglobin 13.6; Platelets 265; Potassium 3.7; Sodium 130*    Lipid Panel    Component Value Date/Time   CHOL 140 07/24/2013 0933   TRIG 142 07/24/2013 0933   HDL 32*  07/24/2013 0933   CHOLHDL 4.4 07/24/2013 0933   VLDL 28 07/24/2013 0933   LDLCALC 80 07/24/2013 0933      Wt Readings from Last 3 Encounters:  05/23/15 115 lb 6.4 oz (52.345 kg)  06/22/14 117 lb 4.8 oz (53.207 kg)  06/18/14 116 lb 9.6 oz (52.889 kg)      Other studies Reviewed: Additional studies/ records that were reviewed today include: 2D ECHO Review of the above records demonstrates:   2D ECHO: 07/20/2012 LV EF: 40% -  45% Study Conclusions - Left ventricle: The cavity size was at the upper limits of normal.  Wall thickness was increased in a pattern of moderate LVH. Systolic function was mildly to moderately reduced. The estimated ejection fraction was in the range of 40% to 45%. Doppler parameters are consistent with abnormal left ventricular relaxation (grade 1 diastolic dysfunction). Doppler parameters are consistent with elevated mean left atrial filling pressure. - Ventricular septum: Septal motion showed abnormal function and dyssynergy. - Aortic valve: There was mild stenosis. Mild to moderate regurgitation. Valve area: 1.28cm^2(VTI). Valve area: 1.4cm^2 (Vmax). - Mitral valve: Calcified annulus and tip af anterior leaflet. Moderate regurgitation. Valve area by pressure half-time: 1.65cm^2. Valve area by continuity equation (using LVOT flow): 1.39cm^2. - Left atrium: The atrium was mildly dilated. - Right ventricle: Systolic pressure was increased. - Atrial septum: No defect or patent foramen ovale was identified. - Tricuspid valve: Moderate regurgitation. - Pulmonary arteries: PA peak pressure: 89mm Hg (S). Impressions: - The right ventricular systolic pressure was increased consistent with moderate pulmonary hypertension.   ASSESSMENT AND PLAN:  Brittany Wallace is a 82 y.o. female with a history of GERD, hypothyroidism, LBBB, HTN, HLD, NICM/chronic systolic CHF ( EF [id number]), mild AS/AR and mod pulm HTN who presents to clinic  for evaluation of pre-syncope.  Presyncope: difficult historian, but seems like this is related to exertion. She does have a history of mild AS/AR by 2D ECHO from 2014.  Valve area: 1.28cm^2(VTI). Valve area:1.4cm^2 (Vmax). Will repeat 2D ECHO to rule out worsening AS/AR as a cause of her symptoms  Chronic systolic CHF/NICM: EF A999333 by 2-D echo in 2014. We will proceed with repeat 2-D echo as above. Recently started on Lasix 20 mg daily by ED provider. She appears euvolemic. Continue beta blocker and ACEi. Stop HCTZ (she was unaware she wasn't supposed to be taking both). Will check a BMET today.   HTN: Blood pressure elevated today. She has been taking both lasix and HCTZ. I have told her to stop her HCTZ. I will add amlodipine 5 mg in addition to her Lotrel 5/40 combination. She is tolerating atenolol at just 12.5 g twice a day with resting pulse in the 60s.  Dizziness with a hx of vertigo: Continue meclizine (i have advised her to start taking this again).    Current medicines are reviewed at length with the patient today.  The patient does not have concerns regarding medicines.  The following changes have been made:  no change  Labs/ tests ordered today include:    Orders Placed This Encounter  Procedures  . Basic Metabolic Panel (BMET)  . EKG 12-Lead  . Echocardiogram     Disposition:   FU with me next week to check BP and go over 2D ECHO  Signed, Eileen Stanford, PA-C  05/23/2015 3:59 PM    [facility] [address], [place], [place]  [address] Phone: [phone]; Fax: [phone]

## 2015-05-23 ENCOUNTER — Other Ambulatory Visit: Payer: Self-pay | Admitting: Cardiovascular Disease

## 2015-05-23 ENCOUNTER — Ambulatory Visit (INDEPENDENT_AMBULATORY_CARE_PROVIDER_SITE_OTHER): Payer: Medicare Other | Admitting: Physician Assistant

## 2015-05-23 VITALS — BP 178/66 | HR 64 | Ht 64.0 in | Wt 115.4 lb

## 2015-05-23 DIAGNOSIS — I428 Other cardiomyopathies: Secondary | ICD-10-CM

## 2015-05-23 DIAGNOSIS — I447 Left bundle-branch block, unspecified: Secondary | ICD-10-CM | POA: Diagnosis not present

## 2015-05-23 DIAGNOSIS — I350 Nonrheumatic aortic (valve) stenosis: Secondary | ICD-10-CM

## 2015-05-23 DIAGNOSIS — R55 Syncope and collapse: Secondary | ICD-10-CM

## 2015-05-23 DIAGNOSIS — I429 Cardiomyopathy, unspecified: Secondary | ICD-10-CM | POA: Diagnosis not present

## 2015-05-23 DIAGNOSIS — I10 Essential (primary) hypertension: Secondary | ICD-10-CM | POA: Diagnosis not present

## 2015-05-23 MED ORDER — AMLODIPINE BESYLATE 5 MG PO TABS: 5.0000 mg | ORAL_TABLET | Freq: Every day | ORAL | Status: DC

## 2015-05-23 NOTE — Patient Instructions (Addendum)
Medication Instructions:  Your physician has recommended you make the following change in your medication:  1.  STOP the Hydrochlorothiazide 2.  START Amlodipine 5 mg taking 1 tablet daily   Labwork: TODAY:  BMET  Testing/Procedures: Your physician has requested that you have an echocardiogram. Echocardiography is a painless test that uses sound waves to create images of your heart. It provides your doctor with information about the size and shape of your heart and how well your heart's chambers and valves are working. This procedure takes approximately one hour. There are no restrictions for this procedure.   Follow-Up: Your physician recommends that you schedule a follow-up appointment ON 05/29/15 WITH KATIE THOMPSON, PA-C Any Other Special Instructions Will Be Listed Below (If Applicable).  Echocardiogram An echocardiogram, or echocardiography, uses sound waves (ultrasound) to produce an image of your heart. The echocardiogram is simple, painless, obtained within a short period of time, and offers valuable information to your health care provider. The images from an echocardiogram can provide information such as:  Evidence of coronary artery disease (CAD).  Heart size.  Heart muscle function.  Heart valve function.  Aneurysm detection.  Evidence of a past heart attack.  Fluid buildup around the heart.  Heart muscle thickening.  Assess heart valve function. LET [facility] CARE PROVIDER KNOW ABOUT:  Any allergies you have.  All medicines you are taking, including vitamins, herbs, eye drops, creams, and over-the-counter medicines.  Previous problems you or members of your family have had with the use of anesthetics.  Any blood disorders you have.  Previous surgeries you have had.  Medical conditions you have.  Possibility of pregnancy, if this applies. BEFORE THE PROCEDURE  No special preparation is needed. Eat and drink normally.  PROCEDURE   In order to  produce an image of your heart, gel will be applied to your chest and a wand-like tool (transducer) will be moved over your chest. The gel will help transmit the sound waves from the transducer. The sound waves will harmlessly bounce off your heart to allow the heart images to be captured in real-time motion. These images will then be recorded.  You may need an IV to receive a medicine that improves the quality of the pictures. AFTER THE PROCEDURE You may return to your normal schedule including diet, activities, and medicines, unless your health care provider tells you otherwise.   This information is not intended to replace advice given to you by your health care provider. Make sure you discuss any questions you have with your health care provider.   Document Released: 03/06/2000 Document Revised: 03/30/2014 Document Reviewed: 11/14/2012 Elsevier Interactive Patient Education [employer].    If you need a refill on your cardiac medications before your next appointment, please call your pharmacy.

## 2015-05-23 NOTE — Telephone Encounter (Signed)
REFILL 

## 2015-05-24 LAB — BASIC METABOLIC PANEL
BUN: 15 mg/dL (ref 7–25)
CO2: 26 mmol/L (ref 20–31)
Calcium: 8.9 mg/dL (ref 8.6–10.4)
Chloride: 98 mmol/L (ref 98–110)
Creat: 0.78 mg/dL (ref 0.60–0.88)
GLUCOSE: 125 mg/dL — AB (ref 65–99)
Potassium: 3.8 mmol/L (ref 3.5–5.3)
SODIUM: 136 mmol/L (ref 135–146)

## 2015-05-28 ENCOUNTER — Ambulatory Visit (HOSPITAL_COMMUNITY)
Admission: RE | Admit: 2015-05-28 | Discharge: 2015-05-28 | Disposition: A | Discharge status: Home / Self Care | Payer: Medicare Other | Source: Ambulatory Visit | Attending: Physician Assistant | Admitting: Physician Assistant

## 2015-05-28 DIAGNOSIS — R29898 Other symptoms and signs involving the musculoskeletal system: Secondary | ICD-10-CM | POA: Insufficient documentation

## 2015-05-28 DIAGNOSIS — I119 Hypertensive heart disease without heart failure: Secondary | ICD-10-CM | POA: Insufficient documentation

## 2015-05-28 DIAGNOSIS — I351 Nonrheumatic aortic (valve) insufficiency: Secondary | ICD-10-CM | POA: Diagnosis not present

## 2015-05-28 DIAGNOSIS — I340 Nonrheumatic mitral (valve) insufficiency: Secondary | ICD-10-CM | POA: Diagnosis not present

## 2015-05-28 DIAGNOSIS — E785 Hyperlipidemia, unspecified: Secondary | ICD-10-CM | POA: Insufficient documentation

## 2015-05-28 DIAGNOSIS — I350 Nonrheumatic aortic (valve) stenosis: Secondary | ICD-10-CM | POA: Insufficient documentation

## 2015-05-28 NOTE — Progress Notes (Signed)
  Echocardiogram 2D Echocardiogram has been performed.  Diamond Nickel 05/28/2015, 4:22 PM

## 2015-05-29 ENCOUNTER — Encounter: Payer: Self-pay | Admitting: Physician Assistant

## 2015-05-29 ENCOUNTER — Ambulatory Visit (INDEPENDENT_AMBULATORY_CARE_PROVIDER_SITE_OTHER): Payer: Medicare Other | Admitting: Physician Assistant

## 2015-05-29 VITALS — BP 150/60 | HR 64 | Ht 64.0 in | Wt 113.0 lb

## 2015-05-29 DIAGNOSIS — R55 Syncope and collapse: Secondary | ICD-10-CM

## 2015-05-29 MED ORDER — FUROSEMIDE 20 MG PO TABS: 20.0000 mg | ORAL_TABLET | Freq: Every day | ORAL | Status: DC

## 2015-05-29 NOTE — Progress Notes (Signed)
Cardiology Office Note   Date:  05/29/2015   ID:  Brittany Wallace, DOB 1920/05/15, MRN [id number]  PCP:  Rocky Morel, MD  Cardiologist:  Dr. Claiborne Billings   Clinic follow up - pre syncope    History of Present Illness: Brittany Wallace is a 82 y.o. female with a history of LBBB, HTN, HLD, NICM/chronic systolic CHF ( EF [id number] 25-30%), alzheimer dementia, and mild AS/AR and mod pulm HTN who presents to clinic for follow up of presyncope.   She has a remote history of a nonischemic cardiomyopathy and in 2001 her ejection fraction was found to be 30%. This subsequently improved and on 07/20/2012 echo Doppler study ejection fraction was 40-45%. She had grade 1 diastolic dysfunction, mild/moderate pulmonary hypertension with a PA pressure of 48 mm, moderate tricuspid regurgitation, moderate mitral regurgitation with mitral annular calcification  She presented to [facility] emergency department on 05/18/15 complaining of increased off-balance dizziness 2 weeks. Her BNP was noted to be elevated to 874 and there was a murmur noted on physical exam. The ED provider was worried about possible worsening aortic stenosis and she was deferred to follow-up with cardiology for further workup. She was released from the emergency department the same day on a newly prescribed diuretic (Lasix 20mg  daily). She had previously been on HCTZ 12.5 mg but had stopped this for unclear reasons.  I saw her in the clinic on 05/23/15 for post emergency room follow-up of presyncope. She was a difficult historian but it sounded like she had the sensation like she was going to pass out every time she got up to do something. She had a history of mild aortic stenosis so I repeated a 2-D echo to evaluate the valve. This showed only mild AR but did show worsening LV function with an EF of 25-30% with a wall motion abnormality in the entireanteroseptal myocardium. She was also told to stop her HCTZ as she was also on Lasix. I told her to  continue on the meclizine with her story of vertigo. Her blood pressure was noted be elevated so I added amlodipine 5 mg daily.  Today she presents to clinic for follow-up. She continues to have the pre syncope episodes. But no other complaints. She thinks it has to do with her chronic inner ear problems. She has an appointment with ENT next week.    Past Medical History  Diagnosis Date  . Hypertension   . Myocardial infarct ([place])     [id number]  . Hypothyroidism   . Hyperlipidemia   . Nonischemic cardiomyopathy ([place])   . LBBB (left bundle branch block)   . Aortic stenosis     Past Surgical History  Procedure Laterality Date  . Abdominal hysterectomy    . Eye surgery    . Cholecystectomy       Current Outpatient Prescriptions  Medication Sig Dispense Refill  . amLODipine (NORVASC) 5 MG tablet Take 1 tablet (5 mg total) by mouth daily. 90 tablet 3  . amLODipine-benazepril (LOTREL) 5-40 MG capsule TAKE (1) CAPSULE BY MOUTH ONCE DAILY. 30 capsule 5  . aspirin EC 81 MG tablet Take 81 mg by mouth daily.    Marland Kitchen atenolol (TENORMIN) 25 MG tablet TAKE (1/2) TABLET BY MOUTH TWICE DAILY. 60 tablet 6  . BETIMOL 0.5 % ophthalmic solution Place 1 drop into both eyes 2 (two) times daily.     . Calcium Citrate-Vitamin D (CITRACAL + D PO) Take 1 tablet by mouth daily.    Marland Kitchen  chlordiazePOXIDE (LIBRIUM) 10 MG capsule Take 10 mg by mouth 3 (three) times daily as needed for anxiety.    . Choline Fenofibrate (FENOFIBRIC ACID) 135 MG CPDR TAKE ONE CAPSULE BY MOUTH ONCE DAILY. 30 capsule 11  . diphenoxylate-atropine (LOMOTIL) 2.5-0.025 MG per tablet Take 1 tablet by mouth 4 (four) times daily as needed for diarrhea or loose stools.    . fluticasone (FLONASE) 50 MCG/ACT nasal spray Place 1 spray into both nostrils as needed for allergies.     . furosemide (LASIX) 20 MG tablet Take 1 tablet (20 mg total) by mouth daily. 21 tablet 0  . lansoprazole (PREVACID) 30 MG capsule Take 30 mg by mouth 2 (two) times  daily.     Marland Kitchen levothyroxine (SYNTHROID, LEVOTHROID) 50 MCG tablet Take 50 mcg by mouth daily before breakfast.    . Lutein 20 MG TABS Take 1 tablet by mouth daily.    . meclizine (ANTIVERT) 25 MG tablet Take 25 mg by mouth as needed for dizziness or nausea.     . montelukast (SINGULAIR) 10 MG tablet Take 10 mg by mouth at bedtime.    . Multiple Vitamins-Minerals (CENTRUM SILVER) tablet Take 1 tablet by mouth daily.    . potassium chloride (KLOR-CON) 8 MEQ tablet TAKE (1) TABLET BY MOUTH TWICE DAILY. 60 tablet 3  . SALINE NASAL SPRAY NA Place into the nose. Patient states that she uses frequently.    . timolol (TIMOPTIC) 0.5 % ophthalmic solution Place 1 drop into both eyes 2 (two) times daily.     No current facility-administered medications for this visit.    Allergies:   Aspirin; Levaquin; and Pantoprazole    Social History:  The patient  reports that she has never smoked. She has never used smokeless tobacco. She reports that she does not drink alcohol or use illicit drugs.   Family History:  The patient's family history includes Cancer in her sister; Heart attack in her father; Hypertension in her brother.    ROS:  Please see the history of present illness.   Otherwise, review of systems are positive for none.   All other systems are reviewed and negative.    PHYSICAL EXAM: VS:  BP 150/60 mmHg  Pulse 64  Ht 5\' 4"  (1.626 m)  Wt 113 lb (51.256 kg)  BMI 19.39 kg/m2 , BMI Body mass index is 19.39 kg/(m^2). GEN: Well nourished, well developed, in no acute distress HEENT: normal Neck: no JVD, carotid bruits, or masses Cardiac: RRR; + murmurs, rubs, or gallops,no edema  Respiratory:  clear to auscultation bilaterally, normal work of breathing GI: soft, nontender, nondistended, + BS MS: no deformity or atrophy Skin: warm and dry, no rash Neuro:  Strength and sensation are intact Psych: euthymic mood, full affect   EKG:  EKG is not ordered today.    Recent Labs: 05/18/2015:  ALT 13*; B Natriuretic Peptide 874.0*; Hemoglobin 13.6; Platelets 265 05/23/2015: BUN 15; Creat 0.78; Potassium 3.8; Sodium 136    Lipid Panel    Component Value Date/Time   CHOL 140 07/24/2013 0933   TRIG 142 07/24/2013 0933   HDL 32* 07/24/2013 0933   CHOLHDL 4.4 07/24/2013 0933   VLDL 28 07/24/2013 0933   LDLCALC 80 07/24/2013 0933      Wt Readings from Last 3 Encounters:  05/29/15 113 lb (51.256 kg)  05/23/15 115 lb 6.4 oz (52.345 kg)  06/22/14 117 lb 4.8 oz (53.207 kg)      Other studies Reviewed: Additional studies/ records  that were reviewed today include: 2D ECHO Review of the above records demonstrates:   2D ECHO: 05/28/2015 LV EF: 25% - 30% Study Conclusions - Left ventricle: The cavity size was normal. Wall thickness was  normal. Systolic function was severely reduced. The estimated  ejection fraction was in the range of 25% to 30%. There is  akinesis of the entireanteroseptal myocardium. Doppler parameters  are consistent with abnormal left ventricular relaxation (grade 1  diastolic dysfunction). Doppler parameters are consistent with  high ventricular filling pressure. - Aortic valve: There was mild regurgitation. Valve area (VTI): 1  cm^2. Valve area (Vmax): 0.98 cm^2. Valve area (Vmean): 0.93  cm^2. - Mitral valve: Calcified annulus. There was moderate  regurgitation. - Left atrium: The atrium was severely dilated. Volume/bsa, S: 63.8  ml/m^2. - Right atrium: The atrium was mildly dilated. - Pulmonary arteries: Systolic pressure was mildly increased. PA  peak pressure: 44 mm Hg (S).   ASSESSMENT AND PLAN:  Brittany Wallace is a 82 y.o. female with a history of LBBB, HTN, HLD, NICM/chronic systolic CHF ( EF [id number] 25-30%), alzheimer dementia, and mild AS/AR and mod pulm HTN who presents to clinic for follow up of presyncope.   Presyncope: difficult historian. Recent ECHO with mild AR. Orthostatics in the office today negative. I do not think  her symptoms are related to her heart and more likely her inner ear. She will see ENT next week.   Chronic systolic CHF/NICM: EF [id number] by 2-D echo in 2014. 2-D echo yesterday showed worsening of LV function to EF 25-30% with anteroseptal WMA and G1DD. If she were younger or more functional i would recommend heart cath with worsening EF and new WMA, but with her age, dementia and frailty, we will continued medical therapy. She has had no chest pain. Recently started on Lasix 20 mg daily by ED provider. She appears euvolemic. Continue beta blocker and ACEi.   HTN: Blood pressure better controlled today: 150/60. Continue amlodipine 5 mg in addition to her Lotrel 5/40 combination as well as atenolol at just 12.5 mg BID  (resting pulse in the 60s.)  Dizziness with a hx of vertigo: Continue meclizine (i have advised her to start taking this again). She will see her ENT doctor next Monday  Current medicines are reviewed at length with the patient today.  The patient does not have concerns regarding medicines.  The following changes have been made: none   Labs/ tests ordered today include:    No orders of the defined types were placed in this encounter.     Disposition:   FU with Dr. Claiborne Billings as previously scheduled  Signed, Crista Luria  05/29/2015 2:11 PM    [address], [place], [place]  [address] Phone: [phone]; Fax: [phone]

## 2015-05-29 NOTE — Patient Instructions (Signed)
Medication Instructions:  Your physician recommends that you continue on your current medications as directed. Please refer to the Current Medication list given to you today.   Labwork: None ordered  Testing/Procedures: None ordered.  Follow-Up: Your physician recommends that you keep your scheduled follow-up appointment as scheduled.    Any Other Special Instructions Will Be Listed Below (If Applicable).     If you need a refill on your cardiac medications before your next appointment, please call your pharmacy.

## 2015-06-03 DIAGNOSIS — Z8669 Personal history of other diseases of the nervous system and sense organs: Secondary | ICD-10-CM | POA: Diagnosis not present

## 2015-06-03 DIAGNOSIS — G4452 New daily persistent headache (NDPH): Secondary | ICD-10-CM | POA: Diagnosis not present

## 2015-06-04 DIAGNOSIS — R7309 Other abnormal glucose: Secondary | ICD-10-CM | POA: Diagnosis not present

## 2015-06-04 DIAGNOSIS — Z1389 Encounter for screening for other disorder: Secondary | ICD-10-CM | POA: Diagnosis not present

## 2015-06-04 DIAGNOSIS — Z681 Body mass index (BMI) 19 or less, adult: Secondary | ICD-10-CM | POA: Diagnosis not present

## 2015-06-04 DIAGNOSIS — I10 Essential (primary) hypertension: Secondary | ICD-10-CM | POA: Diagnosis not present

## 2015-06-04 DIAGNOSIS — E782 Mixed hyperlipidemia: Secondary | ICD-10-CM | POA: Diagnosis not present

## 2015-06-17 ENCOUNTER — Other Ambulatory Visit: Payer: Self-pay | Admitting: Cardiovascular Disease

## 2015-06-19 ENCOUNTER — Ambulatory Visit (INDEPENDENT_AMBULATORY_CARE_PROVIDER_SITE_OTHER): Payer: Medicare Other | Admitting: Internal Medicine

## 2015-06-25 DIAGNOSIS — H90A31 Mixed conductive and sensorineural hearing loss, unilateral, right ear with restricted hearing on the contralateral side: Secondary | ICD-10-CM | POA: Diagnosis not present

## 2015-06-25 DIAGNOSIS — H90A22 Sensorineural hearing loss, unilateral, left ear, with restricted hearing on the contralateral side: Secondary | ICD-10-CM | POA: Diagnosis not present

## 2015-07-04 DIAGNOSIS — H6521 Chronic serous otitis media, right ear: Secondary | ICD-10-CM | POA: Diagnosis not present

## 2015-07-16 ENCOUNTER — Other Ambulatory Visit (INDEPENDENT_AMBULATORY_CARE_PROVIDER_SITE_OTHER): Payer: Self-pay | Admitting: Internal Medicine

## 2015-07-18 DIAGNOSIS — H401111 Primary open-angle glaucoma, right eye, mild stage: Secondary | ICD-10-CM | POA: Diagnosis not present

## 2015-07-18 DIAGNOSIS — H401123 Primary open-angle glaucoma, left eye, severe stage: Secondary | ICD-10-CM | POA: Diagnosis not present

## 2015-07-19 DIAGNOSIS — H6591 Unspecified nonsuppurative otitis media, right ear: Secondary | ICD-10-CM | POA: Diagnosis not present

## 2015-08-14 ENCOUNTER — Other Ambulatory Visit (INDEPENDENT_AMBULATORY_CARE_PROVIDER_SITE_OTHER): Payer: Self-pay | Admitting: Internal Medicine

## 2015-08-20 ENCOUNTER — Ambulatory Visit: Payer: Medicare Other | Admitting: Cardiovascular Disease

## 2015-08-23 ENCOUNTER — Encounter: Payer: Self-pay | Admitting: Cardiovascular Disease

## 2015-08-23 ENCOUNTER — Ambulatory Visit (INDEPENDENT_AMBULATORY_CARE_PROVIDER_SITE_OTHER): Payer: Medicare Other | Admitting: Cardiovascular Disease

## 2015-08-23 VITALS — BP 120/60 | HR 63 | Ht 64.0 in | Wt 112.4 lb

## 2015-08-23 DIAGNOSIS — I359 Nonrheumatic aortic valve disorder, unspecified: Secondary | ICD-10-CM

## 2015-08-23 DIAGNOSIS — I447 Left bundle-branch block, unspecified: Secondary | ICD-10-CM | POA: Diagnosis not present

## 2015-08-23 DIAGNOSIS — E785 Hyperlipidemia, unspecified: Secondary | ICD-10-CM

## 2015-08-23 DIAGNOSIS — I10 Essential (primary) hypertension: Secondary | ICD-10-CM

## 2015-08-23 DIAGNOSIS — I429 Cardiomyopathy, unspecified: Secondary | ICD-10-CM | POA: Diagnosis not present

## 2015-08-23 DIAGNOSIS — I428 Other cardiomyopathies: Secondary | ICD-10-CM

## 2015-08-23 MED ORDER — FUROSEMIDE 20 MG PO TABS: 10.0000 mg | ORAL_TABLET | Freq: Every day | ORAL | Status: AC

## 2015-08-23 MED ORDER — LISINOPRIL 2.5 MG PO TABS: 2.5000 mg | ORAL_TABLET | Freq: Every day | ORAL | Status: DC

## 2015-08-23 NOTE — Patient Instructions (Signed)
Medication Instructions: Dr Claiborne Billings has recommended making the following medication changes: 1. START Lisinopril 2.5 mg - take 1 tablet by mouth daily 2. DECREASE Furosemide to 10 mg - take 0.5 tablet by mouth daily  Labwork: Your physician recommends that you return for lab work at your earliest convenience.  Testing/Procedures: NONE ORDERED  Follow-up: Dr Claiborne Billings recommends that you schedule a follow-up appointment in 2 months.  If you need a refill on your cardiac medications before your next appointment, please call your pharmacy.

## 2015-08-25 ENCOUNTER — Encounter: Payer: Self-pay | Admitting: Cardiovascular Disease

## 2015-08-25 DIAGNOSIS — I359 Nonrheumatic aortic valve disorder, unspecified: Secondary | ICD-10-CM | POA: Insufficient documentation

## 2015-08-25 NOTE — Progress Notes (Signed)
Patient ID: Brittany Wallace, female   DOB: [birth year], 82 y.o.   MRN: [id number]      Primary M.D.: Dr. Benjie Karvonen  HPI: Brittany Wallace is a 82 y.o. female who presents to the office for a 14 month cardiology evaluation.  [name]  has a remote history of a nonischemic cardiomyopathy and in 2001 her ejection fraction was found to be 30%. This subsequently improved and on 07/20/2012 echo Doppler study ejection fraction was 40-45%. She had grade 1 diastolic dysfunction,mild/moderate pulmonary hypertension with a PA pressure of 48 mm, moderate tricuspid regurgitation, moderate mitral regurgitation with mitral annular calcification. She also has a history of left bundle branch block, hypertension, as well as hyperlipidemia.  When I last saw her one year ago, she was continuing to do well.  She denies any significant chest pain or palpitations.  She does have GERD and recently her Prevacid was changed to just 1 capsule daily by Dr. Claiborne Rigg.  In the past.  She did develop increased peripheral edema on amlodipine when her dose was at 10 mg, which did improve with dose reduction to 5 mg.  Last year she sustained a right heel fracture.  She has difficulty hearing and uses a hearing aid.  She has a history of hypothyroidism.  She has been on fenofibrate therapy.  She is unaware of PND orthopnea.    Over the past year, she had some issues with dehydration.  He denies chest pain or any major episodes of shortness of breath.  She presented to [facility] emergency room on 05/18/2015 complaining of being off balance and dizziness.  Her BNP was elevated at 874 and there was a murmur noted on physical exam.  She was seen in follow-up of her emergency room evaluation by Brittany Wallace.  A follow-up echo Doppler study showed worsening of LV function with an EF of 25-30% with wall motion abnormality involving the anteroseptal myocardium.  At that time she was told to stop HCTZ.  Since she also apparently was on Lasix.  Her  blood pressure was elevated and amlodipine 5 mg was instituted.  She denies any further episodes of dizziness.  She presents now for follow-up evaluation.  Past Medical History  Diagnosis Date  . Hypertension   . Myocardial infarct ([place])     [id number]  . Hypothyroidism   . Hyperlipidemia   . Nonischemic cardiomyopathy ([place])   . LBBB (left bundle branch block)   . Aortic stenosis     Past Surgical History  Procedure Laterality Date  . Abdominal hysterectomy    . Eye surgery    . Cholecystectomy      Allergies  Allergen Reactions  . Aspirin     High doses.  [place] [Levofloxacin In D5w]     Fuzzy headed  . Pantoprazole     Current Outpatient Prescriptions  Medication Sig Dispense Refill  . amLODipine (NORVASC) 5 MG tablet Take 1 tablet (5 mg total) by mouth daily. 90 tablet 3  . aspirin EC 81 MG tablet Take 81 mg by mouth daily.    Marland Kitchen atenolol (TENORMIN) 25 MG tablet TAKE (1/2) TABLET BY MOUTH TWICE DAILY. 60 tablet 6  . BETIMOL 0.5 % ophthalmic solution Place 1 drop into both eyes 2 (two) times daily.     . Calcium Citrate-Vitamin D (CITRACAL + D PO) Take 1 tablet by mouth daily.    . chlordiazePOXIDE (LIBRIUM) 10 MG capsule Take 10 mg by mouth 3 (three) times daily as needed  for anxiety.    . Choline Fenofibrate (FENOFIBRIC ACID) 135 MG CPDR TAKE ONE CAPSULE BY MOUTH ONCE DAILY. 30 capsule 11  . diphenoxylate-atropine (LOMOTIL) 2.5-0.025 MG per tablet Take 1 tablet by mouth 4 (four) times daily as needed for diarrhea or loose stools.    . fluticasone (FLONASE) 50 MCG/ACT nasal spray Place 1 spray into both nostrils as needed for allergies.     . furosemide (LASIX) 20 MG tablet Take 0.5 tablets (10 mg total) by mouth daily. 45 tablet 3  . lansoprazole (PREVACID) 30 MG capsule TAKE 1 CAPSULE 2 TIMES DAILY BEFORE A MEAL AND OTHER MEDICATIONS. 60 capsule 0  . levothyroxine (SYNTHROID, LEVOTHROID) 50 MCG tablet Take 50 mcg by mouth daily before breakfast.    . Lutein 20 MG  TABS Take 1 tablet by mouth daily.    . meclizine (ANTIVERT) 25 MG tablet Take 25 mg by mouth as needed for dizziness or nausea.     . montelukast (SINGULAIR) 10 MG tablet Take 10 mg by mouth at bedtime.    . Multiple Vitamins-Minerals (CENTRUM SILVER) tablet Take 1 tablet by mouth daily.    . potassium chloride (KLOR-CON) 8 MEQ tablet TAKE (1) TABLET BY MOUTH TWICE DAILY. 60 tablet 4  . SALINE NASAL SPRAY NA Place into the nose. Patient states that she uses frequently.    . timolol (TIMOPTIC) 0.5 % ophthalmic solution Place 1 drop into both eyes 2 (two) times daily.    Marland Kitchen lisinopril (PRINIVIL,ZESTRIL) 2.5 MG tablet Take 1 tablet (2.5 mg total) by mouth daily. 90 tablet 3   No current facility-administered medications for this visit.    Social History   Social History  . Marital Status: Single    Spouse Name: N/A  . Number of Children: N/A  . Years of Education: N/A   Occupational History  . Not on file.   Social History Main Topics  . Smoking status: Never Smoker   . Smokeless tobacco: Never Used  . Alcohol Use: No  . Drug Use: No  . Sexual Activity: No   Other Topics Concern  . Not on file   Social History Narrative      Socially she is a single has no children. No tobacco alcohol use  Family History  Problem Relation Age of Onset  . Heart attack Father   . Hypertension Brother   . Cancer Sister     ROS General: Negative; No fevers, chills, or night sweats;  HEENT: Positive for decreased hearing, she uses a hearing aid. no sinus congestion, difficulty swallowing Pulmonary: Negative; No cough, wheezing, shortness of breath, hemoptysis Cardiovascular: See history of present illness  ankle swelling has improved GI: Negative; No nausea, vomiting, diarrhea, or abdominal pain GU: Negative; No dysuria, hematuria, or difficulty voiding Musculoskeletal: Negative; no myalgias, joint pain, or weakness Hematologic/Oncology: Negative; no easy bruising, bleeding Endocrine:  Negative; no heat/cold intolerance Neuro: Mild vertigo; no , headaches Skin: Negative; No rashes or skin lesions Psychiatric: Negative; No behavioral problems, depression Sleep: Negative; No snoring, daytime sleepiness, hypersomnolence, bruxism, restless legs, hypnogognic hallucinations, no cataplexy  Other comprehensive 14 point system review is negative.  PE BP 120/60 mmHg  Pulse 63  Ht '5\' 4"'  (1.626 m)  Wt 112 lb 6.4 oz (50.984 kg)  BMI 19.28 kg/m2  SpO2 96%  Repeat blood pressure was 130/70  Wt Readings from Last 3 Encounters:  08/23/15 112 lb 6.4 oz (50.984 kg)  05/29/15 113 lb (51.256 kg)  05/23/15 115 lb 6.4  oz (52.345 kg)   General: Alert, oriented, no distress.  Appears younger than stated age Skin: normal turgor, no rashes HEENT: Normocephalic, atraumatic. Pupils round and reactive; sclera anicteric;no lid lag.  Nose without nasal septal hypertrophy Mouth/Parynx benign; Mallinpatti scale 2 Neck: No JVD, no carotid bruis , with normal carotid upstroke Chest wall: Nontender to palpation Lungs: clear to ausculatation and percussion; no wheezing or rales Heart: RRR, s1 s2 normal 2/6 murmur in the aortic region. Split S2.  No S3 or S4 gallop.  No diastolic murmur, rubs, thrills or heaves. Abdomen: soft, nontender; no hepatosplenomehaly, BS+; abdominal aorta nontender and not dilated by palpation. Back: No CVA Pulses 2+ Extremities: Resolution of prior ankle edema;no clubbing cyanosis, Homan's sign negative  Neurologic: grossly nonfocal Psychological: Normal affect and mood  ECG (independently read by me): Normal sinus rhythm with PACs.  Heart rate 63 bpm.  Mild first-degree AV block with a PR interval at 202 ms.  LVH with repolarization changes.  April 2016 ECG (independently read by me): Normal sinus rhythm at 68 bpm.  Left bundle branch block with repolarization changes.  April 2015 ECG (independently read by me): Sinus bradycardia 54 beats per minute.  Left bundle  branch block with repolarization changes.  Prior 12/28/2012 ECG: Sinus rhythm with left axis deviation and evidence for left bundle branch block. Rate 70 beats per minute.  LABS:   BMP Latest Ref Rng 05/23/2015 05/18/2015 12/14/2013  Glucose 65 - 99 mg/dL 125(H) 107(H) 90  BUN 7 - 25 mg/dL '15 10 19  ' Creatinine 0.60 - 0.88 mg/dL 0.78 0.47 0.98  Sodium 135 - 146 mmol/L 136 130(L) 135  Potassium 3.5 - 5.3 mmol/L 3.8 3.7 4.4  Chloride 98 - 110 mmol/L 98 101 104  CO2 20 - 31 mmol/L 26 21(L) 25  Calcium 8.6 - 10.4 mg/dL 8.9 8.0(L) 9.0    Hepatic Function Latest Ref Rng 05/18/2015 12/14/2013 07/24/2013  Total Protein 6.5 - 8.1 g/dL 6.8 7.0 7.4  Albumin 3.5 - 5.0 g/dL 3.8 4.1 4.6  AST 15 - 41 U/L '22 17 19  ' ALT 14 - 54 U/L 13(L) 13 15  Alk Phosphatase 38 - 126 U/L 42 34(L) 40  Total Bilirubin 0.3 - 1.2 mg/dL 0.7 0.8 0.8     CBC Latest Ref Rng 05/18/2015 12/14/2013 07/24/2013  WBC 4.0 - 10.5 K/uL 6.3 4.7 4.1  Hemoglobin 12.0 - 15.0 g/dL 13.6 12.9 13.2  Hematocrit 36.0 - 46.0 % 39.4 39.0 39.4  Platelets 150 - 400 K/uL 265 270 250   Lab Results  Component Value Date   MCV 90.6 05/18/2015   MCV 92.0 12/14/2013   MCV 90.6 07/24/2013   Lab Results  Component Value Date   TSH 9.344* 08/22/2013   BNP No results found for: PROBNP  Lipid Panel     Component Value Date/Time   CHOL 140 07/24/2013 0933   TRIG 142 07/24/2013 0933   HDL 32* 07/24/2013 0933   CHOLHDL 4.4 07/24/2013 0933   VLDL 28 07/24/2013 0933   LDLCALC 80 07/24/2013 0933     RADIOLOGY: No results found.    ASSESSMENT AND PLAN: Ms. Inis Borneman is a 82 year old female who has a history of hypertension, GERD, hyperlipidemia, hypothyroidism and has a remote history of a cardiomyopathy.  Her initial LV function was a proximal LAD, 30% in 2001 and this had improved to approximate 45% on her echo Doppler study of April 2014.  She did have moderate pulmonary hypertension with a PA pressure estimated  at 48 mm. her prior leg  swelling improved with reducing her amlodipine dose.  She had been recently hospitalized.  I reviewed these records.  Her BNP was elevated consistent with CHF.  Her echo Doppler study on 05/28/2015 now shows an EF back to 25-30%.  She had mild aortic insufficiency, moderate mitral regurgitation, and severe left atrial dilatation with mild RA dilatation.  There was mild pulmonary hypertension with an estimated PA pressure 44 mm.  With her current blood pressure being adequate, I am recommending a trial of lisinopril 2.5 mg daily, particularly with her LV dysfunction.  I will reduce her furosemide to 10 mg from her present dose of 20 mg.  Repeat blood work will be obtained in 2 weeks consisting of a BNP, TSH, and comprehensive metabolic panel.  I will see her in 6-8 weeks for reevaluation  Time spent: 25 minutes Brittany Sine, MD, [facility]  08/25/2015 9:22 PM

## 2015-08-26 ENCOUNTER — Other Ambulatory Visit: Payer: Self-pay | Admitting: Cardiovascular Disease

## 2015-08-26 DIAGNOSIS — I429 Cardiomyopathy, unspecified: Secondary | ICD-10-CM | POA: Diagnosis not present

## 2015-08-26 DIAGNOSIS — I447 Left bundle-branch block, unspecified: Secondary | ICD-10-CM | POA: Diagnosis not present

## 2015-08-26 DIAGNOSIS — I10 Essential (primary) hypertension: Secondary | ICD-10-CM | POA: Diagnosis not present

## 2015-08-27 LAB — COMPREHENSIVE METABOLIC PANEL
ALBUMIN: 4 g/dL (ref 3.2–4.6)
ALT: 7 IU/L (ref 0–32)
AST: 15 IU/L (ref 0–40)
Albumin/Globulin Ratio: 1.4 (ref 1.2–2.2)
Alkaline Phosphatase: 54 IU/L (ref 39–117)
BILIRUBIN TOTAL: 0.4 mg/dL (ref 0.0–1.2)
BUN / CREAT RATIO: 13 (ref 12–28)
BUN: 14 mg/dL (ref 10–36)
CO2: 23 mmol/L (ref 18–29)
CREATININE: 1.04 mg/dL — AB (ref 0.57–1.00)
Calcium: 9 mg/dL (ref 8.7–10.3)
Chloride: 100 mmol/L (ref 96–106)
GFR calc non Af Amer: 46 mL/min/{1.73_m2} — ABNORMAL LOW (ref >59)
GFR, EST AFRICAN AMERICAN: 54 mL/min/{1.73_m2} — AB (ref >59)
GLUCOSE: 103 mg/dL — AB (ref 65–99)
Globulin, Total: 2.8 g/dL (ref 1.5–4.5)
Potassium: 4.8 mmol/L (ref 3.5–5.2)
Sodium: 138 mmol/L (ref 134–144)
TOTAL PROTEIN: 6.8 g/dL (ref 6.0–8.5)

## 2015-08-27 LAB — BRAIN NATRIURETIC PEPTIDE: BNP: 573.1 pg/mL — ABNORMAL HIGH (ref 0.0–100.0)

## 2015-08-27 LAB — TSH: TSH: 2.13 u[IU]/mL (ref 0.450–4.500)

## 2015-09-05 ENCOUNTER — Telehealth: Payer: Self-pay | Admitting: *Deleted

## 2015-09-05 MED ORDER — LISINOPRIL 5 MG PO TABS: 5.0000 mg | ORAL_TABLET | Freq: Every day | ORAL | Status: DC

## 2015-09-05 NOTE — Telephone Encounter (Signed)
Daughter returned a call. Lab results and recommendations were given. She voiced understanding and requested for me to send in a new prescription to the pharmacy for the 5 mg tablets.

## 2015-09-05 NOTE — Telephone Encounter (Signed)
-----   Message from Troy Sine, MD sent at 08/31/2015  2:25 PM EDT ----- BNP elevated; lisinopril was started at ov; increase to 5 mg

## 2015-09-16 ENCOUNTER — Other Ambulatory Visit (INDEPENDENT_AMBULATORY_CARE_PROVIDER_SITE_OTHER): Payer: Self-pay | Admitting: Internal Medicine

## 2015-09-24 ENCOUNTER — Encounter (HOSPITAL_COMMUNITY): Payer: Self-pay

## 2015-09-24 ENCOUNTER — Emergency Department (HOSPITAL_COMMUNITY): Payer: Medicare Other

## 2015-09-24 ENCOUNTER — Emergency Department (HOSPITAL_COMMUNITY)
Admission: EM | Admit: 2015-09-24 | Discharge: 2015-09-24 | Disposition: A | Discharge status: Home / Self Care | Payer: Medicare Other | Attending: Emergency Medicine | Admitting: Emergency Medicine

## 2015-09-24 DIAGNOSIS — S51011A Laceration without foreign body of right elbow, initial encounter: Secondary | ICD-10-CM | POA: Diagnosis not present

## 2015-09-24 DIAGNOSIS — E039 Hypothyroidism, unspecified: Secondary | ICD-10-CM | POA: Diagnosis not present

## 2015-09-24 DIAGNOSIS — K5900 Constipation, unspecified: Secondary | ICD-10-CM | POA: Insufficient documentation

## 2015-09-24 DIAGNOSIS — M25551 Pain in right hip: Secondary | ICD-10-CM | POA: Diagnosis not present

## 2015-09-24 DIAGNOSIS — S5011XA Contusion of right forearm, initial encounter: Secondary | ICD-10-CM | POA: Diagnosis not present

## 2015-09-24 DIAGNOSIS — S3690XA Unspecified injury of unspecified intra-abdominal organ, initial encounter: Secondary | ICD-10-CM | POA: Diagnosis not present

## 2015-09-24 DIAGNOSIS — Y939 Activity, unspecified: Secondary | ICD-10-CM | POA: Insufficient documentation

## 2015-09-24 DIAGNOSIS — Z79899 Other long term (current) drug therapy: Secondary | ICD-10-CM | POA: Diagnosis not present

## 2015-09-24 DIAGNOSIS — R42 Dizziness and giddiness: Secondary | ICD-10-CM | POA: Insufficient documentation

## 2015-09-24 DIAGNOSIS — W1839XA Other fall on same level, initial encounter: Secondary | ICD-10-CM | POA: Insufficient documentation

## 2015-09-24 DIAGNOSIS — Y999 Unspecified external cause status: Secondary | ICD-10-CM | POA: Diagnosis not present

## 2015-09-24 DIAGNOSIS — S79911A Unspecified injury of right hip, initial encounter: Secondary | ICD-10-CM | POA: Diagnosis not present

## 2015-09-24 DIAGNOSIS — S3992XA Unspecified injury of lower back, initial encounter: Secondary | ICD-10-CM | POA: Diagnosis not present

## 2015-09-24 DIAGNOSIS — W19XXXA Unspecified fall, initial encounter: Secondary | ICD-10-CM

## 2015-09-24 DIAGNOSIS — I252 Old myocardial infarction: Secondary | ICD-10-CM | POA: Insufficient documentation

## 2015-09-24 DIAGNOSIS — E785 Hyperlipidemia, unspecified: Secondary | ICD-10-CM | POA: Diagnosis not present

## 2015-09-24 DIAGNOSIS — Y92009 Unspecified place in unspecified non-institutional (private) residence as the place of occurrence of the external cause: Secondary | ICD-10-CM | POA: Diagnosis not present

## 2015-09-24 DIAGNOSIS — I10 Essential (primary) hypertension: Secondary | ICD-10-CM | POA: Insufficient documentation

## 2015-09-24 MED ORDER — ACETAMINOPHEN 325 MG PO TABS
650.0000 mg | ORAL_TABLET | Freq: Once | ORAL | Status: AC
  Administered 2015-09-24: 650 mg via ORAL
  Filled 2015-09-24: qty 2

## 2015-09-24 NOTE — ED Notes (Signed)
Pt states she fell trying to get on a bedside commode. Denies any dizziness.

## 2015-09-24 NOTE — ED Notes (Signed)
MD at bedside. 

## 2015-09-24 NOTE — ED Notes (Signed)
Family at bedside. 

## 2015-09-24 NOTE — ED Notes (Signed)
Pt placed on bedside commode.  

## 2015-09-24 NOTE — ED Notes (Signed)
Pt states she was attempting to use a bedside commode and got her feet tangled up and fell. Complain of pain in her right hip.

## 2015-09-24 NOTE — Discharge Instructions (Signed)
CT scan of your hip and x-rays of your hip and pelvis without any bony injuries. CT scan did show evidence of bruise in the buttock muscle area. Take Tylenol for pain and discomfort. Follow up with your doctor as needed. Return for any new or worse symptoms.

## 2015-09-24 NOTE — ED Notes (Addendum)
Helped patient to bedside cammode.

## 2015-09-24 NOTE — ED Provider Notes (Signed)
CSN: [id number]     Arrival date & time 09/24/15  1153 History  By signing my name below, I, Emmanuella Mensah, attest that this documentation has been prepared under the direction and in the presence of Fredia Sorrow, MD. Electronically Signed: Judithann Sauger, ED Scribe. 09/24/2015. 1:03 PM.    Chief Complaint  Patient presents with  . Hip Pain   Patient is a 82 y.o. female presenting with fall. The history is provided by the patient. No language interpreter was used.  Fall This is a new problem. The current episode started 1 to 2 hours ago. The problem occurs rarely. The problem has not changed since onset.Pertinent negatives include no chest pain, no abdominal pain, no headaches and no shortness of breath. Nothing aggravates the symptoms. Nothing relieves the symptoms. She has tried nothing for the symptoms.   HPI Comments: Brittany Wallace is a 82 y.o. female with a hx of hypertension and HLD who presents to the Emergency Department complaining of ongoing moderate pain to right buttock and an abrasion to right elbow s/p fall that occurred at 5 am today. She states that she fell between the night stand and the door as she was getting off her bedside commode when her feet became tangled. No LOC or head injuries and pt was able to get back up and into bed. No alleviating factors noted. Pt has not tried any medications PTA. Pt denies any anti-coagulants use. She denies any arm pain, back pain, abdominal pain, n/v/d, or numbness/tingling.    PCP: Dr. Armandina Gemma ([place])   Past Medical History  Diagnosis Date  . Hypertension   . Myocardial infarct ([place])     [id number]  . Hypothyroidism   . Hyperlipidemia   . Nonischemic cardiomyopathy ([place])   . LBBB (left bundle branch block)   . Aortic stenosis    Past Surgical History  Procedure Laterality Date  . Abdominal hysterectomy    . Eye surgery    . Cholecystectomy     Family History  Problem Relation Age of Onset  . Heart attack Father    . Hypertension Brother   . Cancer Sister    Social History  Substance Use Topics  . Smoking status: Never Smoker   . Smokeless tobacco: Never Used  . Alcohol Use: No   OB History    No data available     Review of Systems  Constitutional: Negative for fever and chills.  HENT: Negative for rhinorrhea and sore throat.   Eyes: Negative for visual disturbance.  Respiratory: Negative for cough and shortness of breath.   Cardiovascular: Negative for chest pain and leg swelling.  Gastrointestinal: Positive for constipation. Negative for nausea, vomiting, abdominal pain and diarrhea.  Genitourinary: Negative for dysuria and hematuria.  Musculoskeletal: Positive for arthralgias. Negative for back pain.  Skin: Positive for wound. Negative for rash.  Neurological: Positive for dizziness. Negative for headaches.  Hematological: Does not bruise/bleed easily.  Psychiatric/Behavioral: Negative for confusion.      Allergies  Aspirin; Levaquin; and Pantoprazole  Home Medications   Prior to Admission medications   Medication Sig Start Date End Date Taking? Authorizing Provider  amLODipine (NORVASC) 5 MG tablet Take 1 tablet (5 mg total) by mouth daily. 05/23/15  Yes Eileen Stanford, PA-C  atenolol (TENORMIN) 25 MG tablet TAKE (1/2) TABLET BY MOUTH TWICE DAILY. 12/19/14  Yes Troy Sine, MD  BETIMOL 0.5 % ophthalmic solution Place 1 drop into both eyes 2 (two) times daily.  06/06/14  Yes Historical Provider, MD  Calcium Citrate-Vitamin D (CITRACAL + D PO) Take 1 tablet by mouth daily.   Yes Historical Provider, MD  chlordiazePOXIDE (LIBRIUM) 10 MG capsule Take 10 mg by mouth 3 (three) times daily as needed for anxiety.   Yes Historical Provider, MD  Choline Fenofibrate (FENOFIBRIC ACID) 135 MG CPDR TAKE ONE CAPSULE BY MOUTH ONCE DAILY. 09/18/14  Yes Troy Sine, MD  diphenoxylate-atropine (LOMOTIL) 2.5-0.025 MG per tablet Take 1 tablet by mouth 4 (four) times daily as needed for  diarrhea or loose stools.   Yes Historical Provider, MD  fluticasone (FLONASE) 50 MCG/ACT nasal spray Place 1 spray into both nostrils as needed for allergies.  04/10/14  Yes Historical Provider, MD  furosemide (LASIX) 20 MG tablet Take 0.5 tablets (10 mg total) by mouth daily. 08/23/15  Yes Troy Sine, MD  lansoprazole (PREVACID) 30 MG capsule TAKE 1 CAPSULE 2 TIMES DAILY BEFORE A MEAL AND OTHER MEDICATIONS. 09/18/15  Yes Butch Penny, NP  levothyroxine (SYNTHROID, LEVOTHROID) 50 MCG tablet Take 50 mcg by mouth daily before breakfast.   Yes Historical Provider, MD  lisinopril (PRINIVIL,ZESTRIL) 5 MG tablet Take 1 tablet (5 mg total) by mouth daily. 09/05/15  Yes Troy Sine, MD  Lutein 20 MG TABS Take 1 tablet by mouth daily.   Yes Historical Provider, MD  meclizine (ANTIVERT) 25 MG tablet Take 25 mg by mouth as needed for dizziness or nausea.  11/08/12  Yes Historical Provider, MD  montelukast (SINGULAIR) 10 MG tablet Take 10 mg by mouth at bedtime.   Yes Historical Provider, MD  Multiple Vitamins-Minerals (CENTRUM SILVER) tablet Take 1 tablet by mouth daily.   Yes Historical Provider, MD  potassium chloride (KLOR-CON) 8 MEQ tablet TAKE (1) TABLET BY MOUTH TWICE DAILY. 06/17/15  Yes Troy Sine, MD  SALINE NASAL SPRAY NA Place into the nose. Patient states that she uses frequently.   Yes Historical Provider, MD  timolol (TIMOPTIC) 0.5 % ophthalmic solution Place 1 drop into both eyes 2 (two) times daily.   Yes Historical Provider, MD   BP 173/76 mmHg  Pulse 73  Temp(Src) 97.8 F (36.6 C) (Oral)  Resp 16  Ht 5\' 4"  (1.626 m)  Wt 50.803 kg  BMI 19.22 kg/m2  SpO2 94% Physical Exam  Constitutional: She is oriented to person, place, and time. She appears well-developed and well-nourished.  HENT:  Head: Normocephalic and atraumatic.  Mouth/Throat: Oropharynx is clear and moist.  Eyes:  Sclera clear Irregular left pupil due to cataracts surgery  Cardiovascular: Normal rate, regular  rhythm and normal heart sounds.   Pulmonary/Chest: Effort normal and breath sounds normal. No respiratory distress. She has no wheezes. She has no rales.  Abdominal: Soft. Bowel sounds are normal. There is no tenderness.  Musculoskeletal:  No swelling in ankles 1+ DP pulses in right leg 2+ radial pulse  Neurological: She is alert and oriented to person, place, and time.  Skin: Skin is warm and dry.  Scaly skin to hairline Bruise measuring 2 cm to right forearm, 2 small skin tears (1 cm) to right elbow  Psychiatric: She has a normal mood and affect.  Nursing note and vitals reviewed.   ED Course  Procedures (including critical care time) DIAGNOSTIC STUDIES: Oxygen Saturation is 95% on RA, normal by my interpretation.    COORDINATION OF CARE: 12:52 PM- Pt advised of plan for treatment and pt agrees. Pt will receive x-ray for further evaluation.  Labs Review Labs Reviewed -  No data to display  Imaging Review Ct Hip Right Wo Contrast  09/24/2015  CLINICAL DATA:  Right hip pain after falling today. Initial encounter. EXAM: CT OF THE RIGHT HIP WITHOUT CONTRAST TECHNIQUE: Multidetector CT imaging of the right hip was performed according to the standard protocol. Multiplanar CT image reconstructions were also generated. COMPARISON:  Radiograph same date. FINDINGS: Examination is limited to the right hip and inferior right hemipelvis. The entire pelvis is not imaged. Bones: The bones are demineralized. There is no evidence of acute fracture or dislocation. There is no evidence of femoral head avascular necrosis. Joint/cartilage: Mild degenerative changes are present at the right hip. There is no significant hip joint effusion. The sacroiliac joint is not imaged. Ligaments: Not applicable for exam/indication. Tendons/muscles: There is a possible hematoma in the right gluteus maximus muscle, posterior to the right ischium, measuring approximately 3.7 cm on axial image 56 of series 3. No tendon  abnormalities are evident. Neurovascular/other soft tissues: Right femoral atherosclerosis noted. IMPRESSION: 1. No evidence of right hip fracture or dislocation. If the patient has persistent hip pain or inability to bear weight, follow up imaging may be warranted as hip fractures can be occult in the elderly (even by CT). 2. Probable hematoma within the right gluteus maximus muscle, posterior to the ischium. Electronically Signed   By: Richardean Sale M.D.   On: 09/24/2015 15:22   Dg Hips Bilat With Pelvis Min 5 Views  09/24/2015  CLINICAL DATA:  Fall, right hip pain, right elbow pain EXAM: DG HIP (WITH OR WITHOUT PELVIS) 5+V BILAT COMPARISON:  None. FINDINGS: Five views bilateral hip submitted. No acute fracture or subluxation. Diffuse osteopenia. Mild degenerative changes pubic symphysis. Mild bilateral superior acetabular spurring. Degenerative changes lower lumbar spine. IMPRESSION: No acute fracture or subluxation. Diffuse osteopenia. Mild degenerative changes as described above. Electronically Signed   By: Lahoma Crocker M.D.   On: 09/24/2015 13:40     Fredia Sorrow, MD has personally reviewed and evaluated these images and lab results as part of his medical decision-making.  MDM   Final diagnoses:  Fall, initial encounter  Hip pain, acute, right    Patient with fall at home extensive workup without evidence of any hip fracture or pelvis fracture. Some evidence of gluteal muscle of hematoma which may explain the pain. Patient will be treated symptomatically.  I personally performed the services described in this documentation, which was scribed in my presence. The recorded information has been reviewed and is accurate.     Fredia Sorrow, MD 09/24/15 1620

## 2015-10-03 DIAGNOSIS — M1991 Primary osteoarthritis, unspecified site: Secondary | ICD-10-CM | POA: Diagnosis not present

## 2015-10-03 DIAGNOSIS — R296 Repeated falls: Secondary | ICD-10-CM | POA: Diagnosis not present

## 2015-10-03 DIAGNOSIS — T148 Other injury of unspecified body region: Secondary | ICD-10-CM | POA: Diagnosis not present

## 2015-10-03 DIAGNOSIS — Z1389 Encounter for screening for other disorder: Secondary | ICD-10-CM | POA: Diagnosis not present

## 2015-10-03 DIAGNOSIS — Z681 Body mass index (BMI) 19 or less, adult: Secondary | ICD-10-CM | POA: Diagnosis not present

## 2015-10-16 ENCOUNTER — Other Ambulatory Visit: Payer: Self-pay | Admitting: Cardiovascular Disease

## 2015-10-16 ENCOUNTER — Other Ambulatory Visit (INDEPENDENT_AMBULATORY_CARE_PROVIDER_SITE_OTHER): Payer: Self-pay | Admitting: Internal Medicine

## 2015-11-18 ENCOUNTER — Other Ambulatory Visit (INDEPENDENT_AMBULATORY_CARE_PROVIDER_SITE_OTHER): Payer: Self-pay | Admitting: Internal Medicine

## 2015-11-25 ENCOUNTER — Emergency Department (HOSPITAL_COMMUNITY): Payer: Medicare Other

## 2015-11-25 ENCOUNTER — Encounter (HOSPITAL_COMMUNITY): Payer: Self-pay | Admitting: Emergency Medicine

## 2015-11-25 ENCOUNTER — Inpatient Hospital Stay (HOSPITAL_COMMUNITY)
Admission: EM | Admit: 2015-11-25 | Discharge: 2015-11-29 | DRG: 292 | Disposition: A | Discharge status: Skilled Nursing Facility | Payer: Medicare Other | Attending: Family Medicine | Admitting: Family Medicine

## 2015-11-25 DIAGNOSIS — E039 Hypothyroidism, unspecified: Secondary | ICD-10-CM | POA: Diagnosis present

## 2015-11-25 DIAGNOSIS — E876 Hypokalemia: Secondary | ICD-10-CM | POA: Diagnosis present

## 2015-11-25 DIAGNOSIS — E440 Moderate protein-calorie malnutrition: Secondary | ICD-10-CM | POA: Diagnosis present

## 2015-11-25 DIAGNOSIS — H9193 Unspecified hearing loss, bilateral: Secondary | ICD-10-CM | POA: Diagnosis present

## 2015-11-25 DIAGNOSIS — I509 Heart failure, unspecified: Secondary | ICD-10-CM

## 2015-11-25 DIAGNOSIS — Z8249 Family history of ischemic heart disease and other diseases of the circulatory system: Secondary | ICD-10-CM

## 2015-11-25 DIAGNOSIS — I5023 Acute on chronic systolic (congestive) heart failure: Secondary | ICD-10-CM | POA: Diagnosis not present

## 2015-11-25 DIAGNOSIS — R110 Nausea: Secondary | ICD-10-CM | POA: Diagnosis not present

## 2015-11-25 DIAGNOSIS — I252 Old myocardial infarction: Secondary | ICD-10-CM

## 2015-11-25 DIAGNOSIS — Z681 Body mass index (BMI) 19 or less, adult: Secondary | ICD-10-CM

## 2015-11-25 DIAGNOSIS — Z809 Family history of malignant neoplasm, unspecified: Secondary | ICD-10-CM | POA: Diagnosis not present

## 2015-11-25 DIAGNOSIS — I10 Essential (primary) hypertension: Secondary | ICD-10-CM | POA: Diagnosis present

## 2015-11-25 DIAGNOSIS — Z515 Encounter for palliative care: Secondary | ICD-10-CM | POA: Diagnosis not present

## 2015-11-25 DIAGNOSIS — I429 Cardiomyopathy, unspecified: Secondary | ICD-10-CM | POA: Diagnosis not present

## 2015-11-25 DIAGNOSIS — Z7951 Long term (current) use of inhaled steroids: Secondary | ICD-10-CM

## 2015-11-25 DIAGNOSIS — I428 Other cardiomyopathies: Secondary | ICD-10-CM

## 2015-11-25 DIAGNOSIS — Z7189 Other specified counseling: Secondary | ICD-10-CM | POA: Diagnosis not present

## 2015-11-25 DIAGNOSIS — R069 Unspecified abnormalities of breathing: Secondary | ICD-10-CM | POA: Diagnosis not present

## 2015-11-25 DIAGNOSIS — Z66 Do not resuscitate: Secondary | ICD-10-CM | POA: Diagnosis present

## 2015-11-25 DIAGNOSIS — R0602 Shortness of breath: Secondary | ICD-10-CM | POA: Diagnosis not present

## 2015-11-25 DIAGNOSIS — M6281 Muscle weakness (generalized): Secondary | ICD-10-CM | POA: Diagnosis not present

## 2015-11-25 DIAGNOSIS — H3530 Unspecified macular degeneration: Secondary | ICD-10-CM | POA: Diagnosis present

## 2015-11-25 DIAGNOSIS — E785 Hyperlipidemia, unspecified: Secondary | ICD-10-CM | POA: Diagnosis not present

## 2015-11-25 DIAGNOSIS — I110 Hypertensive heart disease with heart failure: Secondary | ICD-10-CM | POA: Diagnosis present

## 2015-11-25 DIAGNOSIS — I5022 Chronic systolic (congestive) heart failure: Secondary | ICD-10-CM | POA: Diagnosis not present

## 2015-11-25 DIAGNOSIS — E871 Hypo-osmolality and hyponatremia: Secondary | ICD-10-CM | POA: Diagnosis present

## 2015-11-25 DIAGNOSIS — Z7982 Long term (current) use of aspirin: Secondary | ICD-10-CM | POA: Diagnosis not present

## 2015-11-25 DIAGNOSIS — K219 Gastro-esophageal reflux disease without esophagitis: Secondary | ICD-10-CM | POA: Diagnosis not present

## 2015-11-25 LAB — BASIC METABOLIC PANEL
Anion gap: 12 (ref 5–15)
BUN: 11 mg/dL (ref 6–20)
CHLORIDE: 98 mmol/L — AB (ref 101–111)
CO2: 20 mmol/L — AB (ref 22–32)
CREATININE: 0.6 mg/dL (ref 0.44–1.00)
Calcium: 8.5 mg/dL — ABNORMAL LOW (ref 8.9–10.3)
GFR calc Af Amer: >60 mL/min (ref >60)
GFR calc non Af Amer: >60 mL/min (ref >60)
Glucose, Bld: 134 mg/dL — ABNORMAL HIGH (ref 65–99)
Potassium: 3.1 mmol/L — ABNORMAL LOW (ref 3.5–5.1)
Sodium: 130 mmol/L — ABNORMAL LOW (ref 135–145)

## 2015-11-25 LAB — BRAIN NATRIURETIC PEPTIDE: B Natriuretic Peptide: 1527 pg/mL — ABNORMAL HIGH (ref 0.0–100.0)

## 2015-11-25 LAB — CBC
HEMATOCRIT: 36.6 % (ref 36.0–46.0)
HEMOGLOBIN: 12.4 g/dL (ref 12.0–15.0)
MCH: 31.2 pg (ref 26.0–34.0)
MCHC: 33.9 g/dL (ref 30.0–36.0)
MCV: 92 fL (ref 78.0–100.0)
Platelets: 309 10*3/uL (ref 150–400)
RBC: 3.98 MIL/uL (ref 3.87–5.11)
RDW: 14.2 % (ref 11.5–15.5)
WBC: 6.7 10*3/uL (ref 4.0–10.5)

## 2015-11-25 LAB — TROPONIN I: Troponin I: <0.03 ng/mL (ref <0.03)

## 2015-11-25 LAB — TSH: TSH: 2.531 u[IU]/mL (ref 0.350–4.500)

## 2015-11-25 MED ORDER — ALBUTEROL SULFATE (2.5 MG/3ML) 0.083% IN NEBU
2.5000 mg | INHALATION_SOLUTION | Freq: Once | RESPIRATORY_TRACT | Status: AC
  Administered 2015-11-25: 2.5 mg via RESPIRATORY_TRACT
  Filled 2015-11-25: qty 3

## 2015-11-25 MED ORDER — FUROSEMIDE 10 MG/ML IJ SOLN
20.0000 mg | Freq: Two times a day (BID) | INTRAMUSCULAR | Status: DC
  Administered 2015-11-26 – 2015-11-29 (×7): 20 mg via INTRAVENOUS
  Filled 2015-11-25 (×7): qty 2

## 2015-11-25 MED ORDER — SODIUM CHLORIDE 0.9% FLUSH: 3.0000 mL | INTRAVENOUS | Status: DC | PRN

## 2015-11-25 MED ORDER — CARVEDILOL 3.125 MG PO TABS
3.1250 mg | ORAL_TABLET | Freq: Two times a day (BID) | ORAL | Status: DC
  Administered 2015-11-25 – 2015-11-29 (×7): 3.125 mg via ORAL
  Filled 2015-11-25 (×8): qty 1

## 2015-11-25 MED ORDER — ACETAMINOPHEN 325 MG PO TABS: 650.0000 mg | ORAL_TABLET | Freq: Four times a day (QID) | ORAL | Status: DC | PRN

## 2015-11-25 MED ORDER — FLUTICASONE PROPIONATE 50 MCG/ACT NA SUSP: 1.0000 | Freq: Two times a day (BID) | NASAL | Status: DC | PRN

## 2015-11-25 MED ORDER — MONTELUKAST SODIUM 10 MG PO TABS
10.0000 mg | ORAL_TABLET | Freq: Every day | ORAL | Status: DC
  Administered 2015-11-25 – 2015-11-28 (×4): 10 mg via ORAL
  Filled 2015-11-25 (×4): qty 1

## 2015-11-25 MED ORDER — FENOFIBRATE 160 MG PO TABS
160.0000 mg | ORAL_TABLET | Freq: Every day | ORAL | Status: DC
  Administered 2015-11-26 – 2015-11-29 (×4): 160 mg via ORAL
  Filled 2015-11-25 (×4): qty 1

## 2015-11-25 MED ORDER — TIMOLOL HEMIHYDRATE 0.5 % OP SOLN: 1.0000 [drp] | Freq: Two times a day (BID) | OPHTHALMIC | Status: DC

## 2015-11-25 MED ORDER — ONDANSETRON HCL 4 MG PO TABS: 4.0000 mg | ORAL_TABLET | Freq: Four times a day (QID) | ORAL | Status: DC | PRN

## 2015-11-25 MED ORDER — PANTOPRAZOLE SODIUM 40 MG PO TBEC
40.0000 mg | DELAYED_RELEASE_TABLET | Freq: Every day | ORAL | Status: DC
Start: 2015-11-25 — End: 2015-11-25

## 2015-11-25 MED ORDER — POLYETHYLENE GLYCOL 3350 17 G PO PACK: 17.0000 g | PACK | Freq: Every day | ORAL | Status: DC | PRN

## 2015-11-25 MED ORDER — AMLODIPINE BESYLATE 5 MG PO TABS
5.0000 mg | ORAL_TABLET | Freq: Every day | ORAL | Status: DC
  Administered 2015-11-26 – 2015-11-29 (×4): 5 mg via ORAL
  Filled 2015-11-25 (×4): qty 1

## 2015-11-25 MED ORDER — FENOFIBRIC ACID 135 MG PO CPDR: 1.0000 | DELAYED_RELEASE_CAPSULE | Freq: Every day | ORAL | Status: DC

## 2015-11-25 MED ORDER — TRAZODONE HCL 50 MG PO TABS
50.0000 mg | ORAL_TABLET | Freq: Every evening | ORAL | Status: DC | PRN
  Administered 2015-11-27 – 2015-11-28 (×2): 50 mg via ORAL
  Filled 2015-11-25 (×3): qty 1

## 2015-11-25 MED ORDER — ASPIRIN EC 81 MG PO TBEC
81.0000 mg | DELAYED_RELEASE_TABLET | Freq: Every day | ORAL | Status: DC
  Administered 2015-11-26 – 2015-11-29 (×4): 81 mg via ORAL
  Filled 2015-11-25 (×4): qty 1

## 2015-11-25 MED ORDER — POTASSIUM CHLORIDE CRYS ER 20 MEQ PO TBCR
40.0000 meq | EXTENDED_RELEASE_TABLET | Freq: Once | ORAL | Status: AC
  Administered 2015-11-25: 40 meq via ORAL
  Filled 2015-11-25: qty 2

## 2015-11-25 MED ORDER — SODIUM CHLORIDE 0.9% FLUSH
3.0000 mL | Freq: Two times a day (BID) | INTRAVENOUS | Status: DC
  Administered 2015-11-25 – 2015-11-29 (×8): 3 mL via INTRAVENOUS

## 2015-11-25 MED ORDER — SODIUM CHLORIDE 0.9 % IV SOLN: 250.0000 mL | INTRAVENOUS | Status: DC | PRN

## 2015-11-25 MED ORDER — ENSURE ENLIVE PO LIQD
237.0000 mL | Freq: Two times a day (BID) | ORAL | Status: DC
  Administered 2015-11-26 – 2015-11-29 (×7): 237 mL via ORAL

## 2015-11-25 MED ORDER — ACETAMINOPHEN 650 MG RE SUPP: 650.0000 mg | Freq: Four times a day (QID) | RECTAL | Status: DC | PRN

## 2015-11-25 MED ORDER — ALBUTEROL SULFATE (2.5 MG/3ML) 0.083% IN NEBU: 2.5000 mg | INHALATION_SOLUTION | RESPIRATORY_TRACT | Status: DC | PRN

## 2015-11-25 MED ORDER — NITROGLYCERIN 2 % TD OINT
1.0000 [in_us] | TOPICAL_OINTMENT | Freq: Three times a day (TID) | TRANSDERMAL | Status: AC
  Administered 2015-11-25 – 2015-11-26 (×2): 1 [in_us] via TOPICAL
  Filled 2015-11-25 (×2): qty 1

## 2015-11-25 MED ORDER — SODIUM CHLORIDE 0.9% FLUSH
3.0000 mL | Freq: Two times a day (BID) | INTRAVENOUS | Status: DC
  Administered 2015-11-27 – 2015-11-29 (×3): 3 mL via INTRAVENOUS

## 2015-11-25 MED ORDER — LISINOPRIL 5 MG PO TABS
5.0000 mg | ORAL_TABLET | Freq: Every day | ORAL | Status: DC
  Administered 2015-11-26: 5 mg via ORAL
  Filled 2015-11-25: qty 1

## 2015-11-25 MED ORDER — ONDANSETRON HCL 4 MG/2ML IJ SOLN
4.0000 mg | Freq: Four times a day (QID) | INTRAMUSCULAR | Status: DC | PRN
Start: 2015-11-25 — End: 2015-11-29

## 2015-11-25 MED ORDER — FUROSEMIDE 10 MG/ML IJ SOLN
40.0000 mg | Freq: Once | INTRAMUSCULAR | Status: AC
  Administered 2015-11-25: 40 mg via INTRAVENOUS
  Filled 2015-11-25: qty 4

## 2015-11-25 MED ORDER — HEPARIN SODIUM (PORCINE) 5000 UNIT/ML IJ SOLN
5000.0000 [IU] | Freq: Three times a day (TID) | INTRAMUSCULAR | Status: DC
  Administered 2015-11-25 – 2015-11-29 (×11): 5000 [IU] via SUBCUTANEOUS
  Filled 2015-11-25 (×11): qty 1

## 2015-11-25 MED ORDER — SENNA 8.6 MG PO TABS
1.0000 | ORAL_TABLET | Freq: Two times a day (BID) | ORAL | Status: DC
Start: 2015-11-25 — End: 2015-11-29
  Administered 2015-11-25 – 2015-11-29 (×8): 8.6 mg via ORAL
  Filled 2015-11-25 (×8): qty 1

## 2015-11-25 MED ORDER — LEVOTHYROXINE SODIUM 50 MCG PO TABS
50.0000 ug | ORAL_TABLET | Freq: Every day | ORAL | Status: DC
  Administered 2015-11-26 – 2015-11-29 (×4): 50 ug via ORAL
  Filled 2015-11-25 (×4): qty 1

## 2015-11-25 MED ORDER — LANSOPRAZOLE 30 MG PO CPDR
30.0000 mg | DELAYED_RELEASE_CAPSULE | Freq: Two times a day (BID) | ORAL | Status: DC
  Administered 2015-11-27 – 2015-11-29 (×5): 30 mg via ORAL
  Filled 2015-11-25 (×4): qty 1

## 2015-11-25 MED ORDER — TIMOLOL HEMIHYDRATE 0.5 % OP SOLN
1.0000 [drp] | Freq: Two times a day (BID) | OPHTHALMIC | Status: DC
  Administered 2015-11-25: 1 [drp] via OPHTHALMIC
  Filled 2015-11-25 (×2): qty 5

## 2015-11-25 MED ORDER — IPRATROPIUM-ALBUTEROL 0.5-2.5 (3) MG/3ML IN SOLN
3.0000 mL | Freq: Once | RESPIRATORY_TRACT | Status: AC
  Administered 2015-11-25: 3 mL via RESPIRATORY_TRACT
  Filled 2015-11-25: qty 3

## 2015-11-25 NOTE — ED Notes (Signed)
RT called for breathing tx. Pt keeps asking "please knock me out, this is terrible".  Pt agreed to oxygen at this time.

## 2015-11-25 NOTE — ED Provider Notes (Signed)
[facility] DEPT Provider Note   CSN: [id number] Arrival date & time: 11/25/15  1342     History   Chief Complaint Chief Complaint  Patient presents with  . Shortness of Breath    HPI Brittany Wallace is a 82 y.o. female.  HPI  Pt was seen at 1355. Per EMS and pt report, c/o gradual onset and worsening of persistent SOB for the past 1 month, worse over the past 2 days. Has been associated with generalized weakness/fatigue. Denies falls, no CP/palpitations, no cough, no abd pain, no N/V/D, no back pain, no focal motor weakness, no tingling/numbness in extremities, no fevers.    Past Medical History:  Diagnosis Date  . Aortic stenosis   . Hyperlipidemia   . Hypertension   . Hypothyroidism   . LBBB (left bundle branch block)   . Myocardial infarct ([place])    [id number]  . Nonischemic cardiomyopathy [facility])     Patient Active Problem List   Diagnosis Date Noted  . Aortic valve disease 08/25/2015  . GERD (gastroesophageal reflux disease) 12/14/2013  . Nausea alone 11/13/2013  . HTN (hypertension) 12/28/2012  . Hyperlipidemia 12/28/2012  . Non-ischemic cardiomyopathy ([place]) 12/28/2012  . LBBB (left bundle branch block) 12/28/2012    Past Surgical History:  Procedure Laterality Date  . ABDOMINAL HYSTERECTOMY    . CHOLECYSTECTOMY    . EYE SURGERY      OB History    No data available       Home Medications    Prior to Admission medications   Medication Sig Start Date End Date Taking? Authorizing Provider  amLODipine (NORVASC) 5 MG tablet Take 1 tablet (5 mg total) by mouth daily. 05/23/15  Yes Eileen Stanford, PA-C  aspirin EC 81 MG tablet Take 81 mg by mouth daily.   Yes Historical Provider, MD  atenolol (TENORMIN) 25 MG tablet TAKE (1/2) TABLET BY MOUTH TWICE DAILY. 12/19/14  Yes Troy Sine, MD  BETIMOL 0.5 % ophthalmic solution Place 1 drop into both eyes 2 (two) times daily.  06/06/14  Yes Historical Provider, MD  Calcium Citrate-Vitamin D (CITRACAL + D PO) Take  1 tablet by mouth daily.   Yes Historical Provider, MD  chlordiazePOXIDE (LIBRIUM) 10 MG capsule Take 10 mg by mouth 3 (three) times daily as needed for anxiety.   Yes Historical Provider, MD  Choline Fenofibrate (FENOFIBRIC ACID) 135 MG CPDR TAKE ONE CAPSULE BY MOUTH ONCE DAILY. 10/16/15  Yes Troy Sine, MD  fluticasone (FLONASE) 50 MCG/ACT nasal spray Place 1 spray into both nostrils as needed for allergies.  04/10/14  Yes Historical Provider, MD  furosemide (LASIX) 20 MG tablet Take 0.5 tablets (10 mg total) by mouth daily. 08/23/15  Yes Troy Sine, MD  hydrochlorothiazide (HYDRODIURIL) 25 MG tablet Take 12.5 mg by mouth daily.   Yes Historical Provider, MD  lansoprazole (PREVACID) 30 MG capsule TAKE 1 CAPSULE 2 TIMES DAILY BEFORE A MEAL AND OTHER MEDICATIONS. 11/18/15  Yes Rogene Houston, MD  levothyroxine (SYNTHROID, LEVOTHROID) 50 MCG tablet Take 50 mcg by mouth daily before breakfast.   Yes Historical Provider, MD  lisinopril (PRINIVIL,ZESTRIL) 5 MG tablet Take 1 tablet (5 mg total) by mouth daily. 09/05/15  Yes Troy Sine, MD  potassium chloride (KLOR-CON) 8 MEQ tablet TAKE (1) TABLET BY MOUTH TWICE DAILY. 06/17/15  Yes Troy Sine, MD  SALINE NASAL SPRAY NA Place into the nose. Patient states that she uses frequently.   Yes Historical Provider, MD  Lutein 20 MG TABS Take 1 tablet by mouth daily.    Historical Provider, MD  meclizine (ANTIVERT) 25 MG tablet Take 25 mg by mouth as needed for dizziness or nausea.  11/08/12   Historical Provider, MD  montelukast (SINGULAIR) 10 MG tablet Take 10 mg by mouth at bedtime.    Historical Provider, MD  Multiple Vitamins-Minerals (CENTRUM SILVER) tablet Take 1 tablet by mouth daily.    Historical Provider, MD  timolol (TIMOPTIC) 0.5 % ophthalmic solution Place 1 drop into both eyes 2 (two) times daily.    Historical Provider, MD    Family History Family History  Problem Relation Age of Onset  . Heart attack Father   . Hypertension Brother    . Cancer Sister     Social History Social History  Substance Use Topics  . Smoking status: Never Smoker  . Smokeless tobacco: Never Used  . Alcohol use No     Allergies   Aspirin; Levaquin [levofloxacin in d5w]; and Pantoprazole   Review of Systems Review of Systems ROS: Statement: All systems negative except as marked or noted in the HPI; Constitutional: Negative for fever and chills. +generalized weakness/fatigue. ; ; Eyes: Negative for eye pain, redness and discharge. ; ; ENMT: Negative for ear pain, hoarseness, nasal congestion, sinus pressure and sore throat. ; ; Cardiovascular: Negative for chest pain, palpitations, diaphoresis, and peripheral edema. ; ; Respiratory: +SOB. Negative for cough, wheezing and stridor. ; ; Gastrointestinal: Negative for nausea, vomiting, diarrhea, abdominal pain, blood in stool, hematemesis, jaundice and rectal bleeding. . ; ; Genitourinary: Negative for dysuria, flank pain and hematuria. ; ; Musculoskeletal: Negative for back pain and neck pain. Negative for swelling and trauma.; ; Skin: Negative for pruritus, rash, abrasions, blisters, bruising and skin lesion.; ; Neuro: Negative for headache, lightheadedness and neck stiffness. Negative for altered level of consciousness, altered mental status, extremity weakness, paresthesias, involuntary movement, seizure and syncope.       Physical Exam Updated Vital Signs BP 163/71   Pulse 63   Resp 25   Ht 5\' 4"  (1.626 m)   Wt 110 lb (49.9 kg)   SpO2 96%   BMI 18.88 kg/m   Physical Exam 1400: Physical examination:  Nursing notes reviewed; Vital signs and O2 SAT reviewed;  Constitutional: Well developed, Well nourished, Well hydrated, In no acute distress; Head:  Normocephalic, atraumatic; Eyes: EOMI, PERRL, No scleral icterus; ENMT: Mouth and pharynx normal, Mucous membranes moist; Neck: Supple, Full range of motion, No lymphadenopathy; Cardiovascular: Regular rate and rhythm, No gallop; Respiratory:  Breath sounds coarse & equal bilaterally, No wheezes.  Speaking full sentences with ease, Normal respiratory effort/excursion; Chest: Nontender, Movement normal; Abdomen: Soft, Nontender, Nondistended, Normal bowel sounds; Genitourinary: No CVA tenderness; Extremities: Pulses normal, No tenderness, No edema, No calf edema or asymmetry.; Neuro: AA&Ox3, Major CN grossly intact.  Speech clear. No gross focal motor or sensory deficits in extremities.; Skin: Color normal, Warm, Dry.; Psych:  Anxious.  ED Treatments / Results  Labs (all labs ordered are listed, but only abnormal results are displayed)   EKG  EKG Interpretation  Date/Time:  Monday November 25 2015 13:52:53 EDT Ventricular Rate:  75 PR Interval:    QRS Duration: 166 QT Interval:  474 QTC Calculation: 530 R Axis:   31 Text Interpretation:  Sinus rhythm Prolonged PR interval Probable left atrial enlargement Left bundle branch block Baseline wander in lead(s) V5 V6 since last tracing no significant change Confirmed by MILLER  MD, BRIAN ([id number])  on 11/25/2015 1:59:13 PM       Radiology   Procedures Procedures (including critical care time)  Medications Ordered in ED Medications  furosemide (LASIX) injection 40 mg (not administered)  ipratropium-albuterol (DUONEB) 0.5-2.5 (3) MG/3ML nebulizer solution 3 mL (3 mLs Nebulization Given 11/25/15 1414)  albuterol (PROVENTIL) (2.5 MG/3ML) 0.083% nebulizer solution 2.5 mg (2.5 mg Nebulization Given 11/25/15 1414)     Initial Impression / Assessment and Plan / ED Course  I have reviewed the triage vital signs and the nursing notes.  Pertinent labs & imaging results that were available during my care of the patient were reviewed by me and considered in my medical decision making (see chart for details).  MDM Reviewed: previous chart, nursing note and vitals Reviewed previous: labs and ECG Interpretation: labs, ECG and x-ray   Results for orders placed or performed during the  hospital encounter of 11/25/15  CBC  Result Value Ref Range   WBC 6.7 4.0 - 10.5 K/uL   RBC 3.98 3.87 - 5.11 MIL/uL   Hemoglobin 12.4 12.0 - 15.0 g/dL   HCT 36.6 36.0 - 46.0 %   MCV 92.0 78.0 - 100.0 fL   MCH 31.2 26.0 - 34.0 pg   MCHC 33.9 30.0 - 36.0 g/dL   RDW 14.2 11.5 - 15.5 %   Platelets 309 150 - 400 K/uL  Basic metabolic panel  Result Value Ref Range   Sodium 130 (L) 135 - 145 mmol/L   Potassium 3.1 (L) 3.5 - 5.1 mmol/L   Chloride 98 (L) 101 - 111 mmol/L   CO2 20 (L) 22 - 32 mmol/L   Glucose, Bld 134 (H) 65 - 99 mg/dL   BUN 11 6 - 20 mg/dL   Creatinine, Ser 0.60 0.44 - 1.00 mg/dL   Calcium 8.5 (L) 8.9 - 10.3 mg/dL   GFR calc non Af Amer >60 >60 mL/min   GFR calc Af Amer >60 >60 mL/min   Anion gap 12 5 - 15  Troponin I  Result Value Ref Range   Troponin I <0.03 <0.03 ng/mL  Brain natriuretic peptide  Result Value Ref Range   B Natriuretic Peptide 1,527.0 (H) 0.0 - 100.0 pg/mL   Dg Chest 1 View Result Date: 11/25/2015 CLINICAL DATA:  Shortness of breath, hypertension, prior MI, non ischemic cardiomyopathy EXAM: CHEST 1 VIEW COMPARISON:  Portable exam 1411 hours compared to 07/07/2012 FINDINGS: Enlargement of cardiac silhouette with pulmonary vascular congestion. Atherosclerotic calcification aorta. Perihilar interstitial infiltrates likely representing pulmonary edema and CHF. Bibasilar effusions and atelectasis. No pneumothorax. Bones demineralized. IMPRESSION: Probable CHF with bibasilar effusions and atelectasis. Electronically Signed   By: Lavonia Dana M.D.   On: 11/25/2015 14:22    1540:  Potassium repleted PO. BNP elevated from previous with CHF on CXR; IV lasix given. Dx and testing d/w pt and family.  Questions answered.  Verb understanding, agreeable to admit. T/C to [facility]. Denton Brick, case discussed, including:  HPI, pertinent PM/SHx, VS/PE, dx testing, ED course and treatment:  Agreeable to admit, requests he will come to ED for evaluation.    Final Clinical  Impressions(s) / ED Diagnoses   Final diagnoses:  None    New Prescriptions New Prescriptions   No medications on file     Francine Graven, DO 11/27/15 1751

## 2015-11-25 NOTE — H&P (Signed)
Patient Demographics:    Brittany Wallace, is a 82 y.o. female  MRN: [id number]   DOB - 1920/05/14  Admit Date - 11/25/2015  Outpatient Primary MD for the patient is Rocky Morel, MD    Assessment & Plan:    Principal Problem:   Systolic CHF, acute on chronic [facility]) Active Problems:   HTN (hypertension)   Hyperlipidemia   Non-ischemic cardiomyopathy ([place])   Acute exacerbation of CHF (congestive heart failure) ([place])    1)Acute Systolic CHF exacerbation- last known EF 25% based on echocardiogram from March 2017, give IV Lasix, daily weight, output and input monitoring, BNP is over 1500 baseline around 800 usually, cycle troponins to rule out ACS. EKG with old left bundle branch block no new findings otherwise. Continue lisinopril, change atenolol to Coreg given very low EF. Consider adding Aldactone despite her age given her normal renal function and persistent hypokalemia she might tolerate Aldactone well. Patient has been compliant with low-salt diet. Check TSH. no significant pedal edema noted, patient has mostly left-sided Heart Failure symptoms  2)HTN-blood pressure is not at goal, start Coreg as above in #1, continue amlodipine and lisinopril, okay to titrate lisinopril up as long as renal function remains okay.   may use IV Hydralazine 10 mg  Every 4 hours Prn for systolic blood pressure over 160 mmhg   3)Social/Ethics- patient is a DNR/DNI, patient is somewhat fed up with being chronically ill, she is not depressed but she expresses desire not to live like this any longer. She denies suicide ideation or plan. Get palliative care consult to help determining goals of care, consider completing the MOST form for patient  4)Disposition- patient has visual disturbance chronic shortness of breath secondary to  systolic dysfunction CHF, increasing difficulty with mobility related activities of daily living due to dyspnea on exertion, consider placement to skilled nursing facility for subacute rehabilitation prior to returning home. Social work consult pending, patient is leaning towards may be having hospice involvement in the near future    With History of - Reviewed by me  Past Medical History:  Diagnosis Date  . Aortic stenosis   . Hyperlipidemia   . Hypertension   . Hypothyroidism   . LBBB (left bundle branch block)   . Myocardial infarct ([place])    [id number]  . Nonischemic cardiomyopathy [facility])       Past Surgical History:  Procedure Laterality Date  . ABDOMINAL HYSTERECTOMY    . CHOLECYSTECTOMY    . EYE SURGERY        Chief Complaint  Patient presents with  . Shortness of Breath      HPI:    Brittany Wallace  is a 82 y.o. female, With past medical history relevant for systolic dysfunction CHF with EF in the 25% range as well as hypertension and hypothyroidism who presents with increasing shortness of breath and dyspnea on exertion especially over the last couple of days. Symptoms actually started  a few weeks ago . No frank chest pains no palpitations patient does have some orthopnea but no paroxysmal nocturnal dyspnea, no significant lower extremity edema. No pleuritic symptoms, no fever no chills no productive cough, her nephew is at bedside, questions answered. Patient tells me she has been compliant with low-salt diet.  Caregivers also here confirms that patient usually is compliant with diet    Review of systems:    In addition to the HPI above,   A full 12 point Review of 10 Systems was done, except as stated above, all other Review of 10 Systems were negative.    Social History:  Reviewed by me    Social History  Substance Use Topics  . Smoking status: Never Smoker  . Smokeless tobacco: Never Used  . Alcohol use No       Family History :  Reviewed by me      Family History  Problem Relation Age of Onset  . Heart attack Father   . Hypertension Brother   . Cancer Sister       Home Medications:   Prior to Admission medications   Medication Sig Start Date End Date Taking? Authorizing Provider  amLODipine (NORVASC) 5 MG tablet Take 1 tablet (5 mg total) by mouth daily. 05/23/15  Yes Eileen Stanford, PA-C  aspirin EC 81 MG tablet Take 81 mg by mouth daily.   Yes Historical Provider, MD  atenolol (TENORMIN) 25 MG tablet TAKE (1/2) TABLET BY MOUTH TWICE DAILY. 12/19/14  Yes Troy Sine, MD  BETIMOL 0.5 % ophthalmic solution Place 1 drop into both eyes 2 (two) times daily.  06/06/14  Yes Historical Provider, MD  Calcium Citrate-Vitamin D (CITRACAL + D PO) Take 1 tablet by mouth daily.   Yes Historical Provider, MD  Choline Fenofibrate (FENOFIBRIC ACID) 135 MG CPDR TAKE ONE CAPSULE BY MOUTH ONCE DAILY. 10/16/15  Yes Troy Sine, MD  fluticasone (FLONASE) 50 MCG/ACT nasal spray Place 1 spray into both nostrils 2 (two) times daily as needed for allergies.  04/10/14  Yes Historical Provider, MD  furosemide (LASIX) 20 MG tablet Take 0.5 tablets (10 mg total) by mouth daily. 08/23/15  Yes Troy Sine, MD  lansoprazole (PREVACID) 30 MG capsule TAKE 1 CAPSULE 2 TIMES DAILY BEFORE A MEAL AND OTHER MEDICATIONS. 11/18/15  Yes Rogene Houston, MD  levothyroxine (SYNTHROID, LEVOTHROID) 50 MCG tablet Take 50 mcg by mouth daily before breakfast.   Yes Historical Provider, MD  lisinopril (PRINIVIL,ZESTRIL) 5 MG tablet Take 1 tablet (5 mg total) by mouth daily. 09/05/15  Yes Troy Sine, MD  meclizine (ANTIVERT) 25 MG tablet Take 25 mg by mouth 4 (four) times daily as needed for dizziness or nausea.  11/08/12  Yes Historical Provider, MD  montelukast (SINGULAIR) 10 MG tablet Take 10 mg by mouth at bedtime.   Yes Historical Provider, MD  potassium chloride (KLOR-CON) 8 MEQ tablet TAKE (1) TABLET BY MOUTH TWICE DAILY. 06/17/15  Yes Troy Sine, MD  SALINE NASAL  SPRAY NA Place into the nose. Patient states that she uses frequently.   Yes Historical Provider, MD     Allergies:     Allergies  Allergen Reactions  . Aspirin     High doses.  Mack Hook [Levofloxacin In D5w]     Fuzzy headed  . Pantoprazole      Physical Exam:   Vitals  Blood pressure (!) 163/63, pulse 75, temperature 97.5 F (36.4 C), temperature source Oral,  resp. rate 17, height 5\' 4"  (1.626 m), weight 49.9 kg (110 lb), SpO2 97 %.  Physical Examination: General appearance - alert, well appearing, and in no distress  Mental status - alert, oriented to person, place, and time,  Ears- very hard of hearing Eyes - sclera anicteric, poor vision Neck - supple, no JVD elevation , Chest - diminished in bases with scattered rales bilaterally Heart - S1 and S2 normal, 3/6 systolic murmur Abdomen - soft, nontender, nondistended, no masses or organomegaly Neurological - screening mental status exam normal, neck supple without rigidity, cranial nerves II through XII intact, DTR's normal and symmetric Extremities - no significant pedal edema noted, intact peripheral pulses Skin - warm, dry    Data Review:    CBC  Recent Labs Lab 11/25/15 1435  WBC 6.7  HGB 12.4  HCT 36.6  PLT 309  MCV 92.0  MCH 31.2  MCHC 33.9  RDW 14.2   ------------------------------------------------------------------------------------------------------------------  Chemistries   Recent Labs Lab 11/25/15 1435  NA 130*  K 3.1*  CL 98*  CO2 20*  GLUCOSE 134*  BUN 11  CREATININE 0.60  CALCIUM 8.5*   ------------------------------------------------------------------------------------------------------------------ estimated creatinine clearance is 34.6 mL/min (by C-G formula based on SCr of 0.8 mg/dL). ------------------------------------------------------------------------------------------------------------------  Recent Labs  11/25/15 1848  TSH 2.531     Coagulation profile No  results for input(s): INR, PROTIME in the last 168 hours. ------------------------------------------------------------------------------------------------------------------- No results for input(s): DDIMER in the last 72 hours. -------------------------------------------------------------------------------------------------------------------  Cardiac Enzymes  Recent Labs Lab 11/25/15 1435  TROPONINI <0.03   ------------------------------------------------------------------------------------------------------------------    Component Value Date/Time   BNP 1,527.0 (H) 11/25/2015 1435     ---------------------------------------------------------------------------------------------------------------  Urinalysis    Component Value Date/Time   COLORURINE YELLOW 05/18/2015 1352   APPEARANCEUR CLEAR 05/18/2015 1352   LABSPEC 1.010 05/18/2015 1352   PHURINE 7.5 05/18/2015 1352   GLUCOSEU NEGATIVE 05/18/2015 1352   HGBUR NEGATIVE 05/18/2015 1352   BILIRUBINUR NEGATIVE 05/18/2015 1352   KETONESUR NEGATIVE 05/18/2015 1352   PROTEINUR NEGATIVE 05/18/2015 1352   UROBILINOGEN 0.2 07/07/2012 2040   NITRITE NEGATIVE 05/18/2015 1352   LEUKOCYTESUR NEGATIVE 05/18/2015 1352    ----------------------------------------------------------------------------------------------------------------   Imaging Results:    Dg Chest 1 View  Result Date: 11/25/2015 CLINICAL DATA:  Shortness of breath, hypertension, prior MI, non ischemic cardiomyopathy EXAM: CHEST 1 VIEW COMPARISON:  Portable exam 1411 hours compared to 07/07/2012 FINDINGS: Enlargement of cardiac silhouette with pulmonary vascular congestion. Atherosclerotic calcification aorta. Perihilar interstitial infiltrates likely representing pulmonary edema and CHF. Bibasilar effusions and atelectasis. No pneumothorax. Bones demineralized. IMPRESSION: Probable CHF with bibasilar effusions and atelectasis. Electronically Signed   By: Lavonia Dana M.D.    On: 11/25/2015 14:22    Radiological Exams on Admission: Dg Chest 1 View  Result Date: 11/25/2015 CLINICAL DATA:  Shortness of breath, hypertension, prior MI, non ischemic cardiomyopathy EXAM: CHEST 1 VIEW COMPARISON:  Portable exam 1411 hours compared to 07/07/2012 FINDINGS: Enlargement of cardiac silhouette with pulmonary vascular congestion. Atherosclerotic calcification aorta. Perihilar interstitial infiltrates likely representing pulmonary edema and CHF. Bibasilar effusions and atelectasis. No pneumothorax. Bones demineralized. IMPRESSION: Probable CHF with bibasilar effusions and atelectasis. Electronically Signed   By: Lavonia Dana M.D.   On: 11/25/2015 14:22    DVT Prophylaxis -SCD   AM Labs Ordered, also please review Full Orders  Family Communication: Admission, patients condition and plan of care including tests being ordered have been discussed with the patient and  who indicate understanding and agree with the plan  Code Status - Full Code  Likely DC to  SNF  Condition   fair  Ludivina Guymon M.D on 11/25/2015 at 8:31 PM   Between 7am to 7pm - Pager - [phone]  After 7pm go to [email or web address] - password [email or web address]  [facility] - Office  [phone]  Dragon dictation system was used to create this note, attempts have been made to correct errors, however presence of uncorrected errors is not a reflection quality of care provided.

## 2015-11-25 NOTE — ED Notes (Signed)
Pt states she stated to feel SOB two days ago.  Pt denies a fall, she reports feeling weak.

## 2015-11-25 NOTE — ED Notes (Signed)
Report given to [facility]. 

## 2015-11-25 NOTE — ED Notes (Signed)
Hospitalist in the room talking to the Pt

## 2015-11-25 NOTE — ED Triage Notes (Signed)
Pt called EMS for SOB. EMS states pt told them several times that she just wanted to die. Pt refused oxygen bc of this reason. Pt in bed now stating "this is awful, death is better than this". Mild labored breathing noted.

## 2015-11-25 NOTE — ED Notes (Signed)
hospitalist called back to see why pt is going to [facility]. [facility]. [facility] called back and advised was a mistake and supposed to be admitted here. Secretary aware. Nad.

## 2015-11-26 ENCOUNTER — Encounter (HOSPITAL_COMMUNITY): Payer: Self-pay | Admitting: Primary Care

## 2015-11-26 DIAGNOSIS — Z515 Encounter for palliative care: Secondary | ICD-10-CM

## 2015-11-26 DIAGNOSIS — Z7189 Other specified counseling: Secondary | ICD-10-CM

## 2015-11-26 DIAGNOSIS — E440 Moderate protein-calorie malnutrition: Secondary | ICD-10-CM | POA: Insufficient documentation

## 2015-11-26 DIAGNOSIS — I429 Cardiomyopathy, unspecified: Secondary | ICD-10-CM

## 2015-11-26 LAB — BASIC METABOLIC PANEL
ANION GAP: 7 (ref 5–15)
BUN: 10 mg/dL (ref 6–20)
CHLORIDE: 101 mmol/L (ref 101–111)
CO2: 25 mmol/L (ref 22–32)
Calcium: 8.6 mg/dL — ABNORMAL LOW (ref 8.9–10.3)
Creatinine, Ser: 0.48 mg/dL (ref 0.44–1.00)
GFR calc Af Amer: >60 mL/min (ref >60)
GLUCOSE: 94 mg/dL (ref 65–99)
POTASSIUM: 3.5 mmol/L (ref 3.5–5.1)
SODIUM: 133 mmol/L — AB (ref 135–145)

## 2015-11-26 LAB — CBC
HEMATOCRIT: 35.8 % — AB (ref 36.0–46.0)
HEMOGLOBIN: 12.1 g/dL (ref 12.0–15.0)
MCH: 31.3 pg (ref 26.0–34.0)
MCHC: 33.8 g/dL (ref 30.0–36.0)
MCV: 92.7 fL (ref 78.0–100.0)
Platelets: 235 10*3/uL (ref 150–400)
RBC: 3.86 MIL/uL — ABNORMAL LOW (ref 3.87–5.11)
RDW: 13.8 % (ref 11.5–15.5)
WBC: 4 10*3/uL (ref 4.0–10.5)

## 2015-11-26 LAB — TROPONIN I: Troponin I: <0.03 ng/mL (ref <0.03)

## 2015-11-26 MED ORDER — TIMOLOL MALEATE 0.5 % OP SOLN
1.0000 [drp] | Freq: Two times a day (BID) | OPHTHALMIC | Status: DC
  Administered 2015-11-26 – 2015-11-29 (×7): 1 [drp] via OPHTHALMIC
  Filled 2015-11-26: qty 5

## 2015-11-26 MED ORDER — LISINOPRIL 10 MG PO TABS: 10.0000 mg | ORAL_TABLET | Freq: Every day | ORAL | Status: DC

## 2015-11-26 MED ORDER — ADULT MULTIVITAMIN W/MINERALS CH
1.0000 | ORAL_TABLET | Freq: Every day | ORAL | Status: DC
  Administered 2015-11-26 – 2015-11-29 (×4): 1 via ORAL
  Filled 2015-11-26 (×4): qty 1

## 2015-11-26 NOTE — Consult Note (Signed)
Consultation Note Date: 11/26/2015   Patient Name: Brittany Wallace  DOB: 1920/09/11  MRN: [id number]  Age / Sex: 82 y.o., female  PCP: Caren Macadam, MD Referring Physician: Donne Hazel, MD   Reason for Consultation: Disposition, Establishing goals of care and Psychosocial/spiritual support  HPI/Patient Profile: 82 y.o. female  with past medical history of Hypertension, hyperlipidemia, nonischemic cardiomyopathy, and systolic heart failure admitted on 11/25/2015 with acute exacerbation of heart failure.   Clinical Assessment and Goals of Care: Brittany Wallace is resting quietly in her Roland Earl chair, her niece Zigmund Daniel is at her side. We talk about her functional status, and health concerns. She shares that she has a caregiver Monday and Tuesday for a few hours each day. She states long time caregiver, Noreene Larsson, takes her to MD appointments, gets groceries, and helps to pay bills.  She states she can no longer cook, and gets some foods from local restaurants. We talk about sodium in foods, and I believe, Brittany Wallace is aware of high sodium foods, but is constrained by her health and mobility.  She is able to get her own bath, but states she has dry skin, noticed changes over the last two years. She states she has lost weight from her normal weight of 115#.  Brittany Wallace states she has trust in her cardiologist, Dr. Claiborne Billings.  We talk about medication burden, and Brittany Wallace states she would like to take less medications. "I take 10 medications".  We talk about going to rehab at [facility] if possible. Zigmund Daniel shares that her mother stayed at [facility] for 12 years, and she has a relationship with SW on site. We talk about logistics related to rehab, Medicare and Medicaid. Zigmund Daniel has experience with navigating the system, and is Brittany Wallace durable POA.  We talk about HCPOA and durable POA (noted below). We also talk about Advanced directives and  Brittany Wallace states that she would not want extraordinary measures. She states "you've got to give" that she feels she needs to be productive.  I ask about returning to the hospital if needed, and Brittany Wallace states that if the doctors state they cant help her to "let me go".  Brittany Wallace desire is for no extraordinary measures such as CPR, intubation, no peg tube, blood transfusion (per Zigmund Daniel this has been for years - NOT JEHOVAH's WITNESS), "nothing artificial", including living as oxygen dependant.   I will complete a MOST form with Brittany Wallace, and continue to follow Brittany Wallace at [facility] if she is accepted.   Healthcare power of attorney HCPOA - longtime caregiver Winifred Olive. Mrs. Kovack states that Reino Bellis knows her wishes to allow a natural death, and can abide her choice.  Durable power of attorney shared with niece Zigmund Daniel and Junita Push.    SUMMARY OF RECOMMENDATIONS   continue to treat the treatable, but no extraordinary measures such as CPR, intubation, peg tube, blood transfusion, "nothing artificial", including oxygen dependency.  Code Status/Advance Care Planning:  DNR - allow a natural death  Symptom Management:   per hospitalist  Palliative Prophylaxis:   Frequent Pain Assessment and Turn Reposition  Additional Recommendations (Limitations, Scope, Preferences):  Avoid Hospitalization, Minimize Medications, No Artificial Feeding, No Blood Transfusions, No Chemotherapy and No Hemodialysis  Psycho-social/Spiritual:   Desire for further Chaplaincy support:no  Additional Recommendations: Caregiving  Support/Resources and Education on Hospice  Prognosis:   < 6 months, likely based on chronic illness burden, frailty, and Brittany Wallace desire to not have life-prolonging measures.  Discharge Planning: Goal is [facility] for rehab, then transition to resident status      Primary Diagnoses: Present on Admission: . Systolic CHF, acute on chronic (HCC) . HTN (hypertension) .  Hyperlipidemia   I have reviewed the medical record, interviewed the patient and family, and examined the patient. The following aspects are pertinent.  Past Medical History:  Diagnosis Date  . Aortic stenosis   . Hyperlipidemia   . Hypertension   . Hypothyroidism   . LBBB (left bundle branch block)   . Myocardial infarct (HCC)    [id number]  . Nonischemic cardiomyopathy [facility])    Social History   Social History  . Marital status: Single    Spouse name: N/A  . Number of children: N/A  . Years of education: N/A   Social History Main Topics  . Smoking status: Never Smoker  . Smokeless tobacco: Never Used  . Alcohol use No  . Drug use: No  . Sexual activity: No   Other Topics Concern  . None   Social History Narrative  . None   Family History  Problem Relation Age of Onset  . Heart attack Father   . Hypertension Brother   . Cancer Sister    Scheduled Meds: . amLODipine  5 mg Oral Daily  . aspirin EC  81 mg Oral Daily  . carvedilol  3.125 mg Oral BID WC  . feeding supplement (ENSURE ENLIVE)  237 mL Oral BID BM  . fenofibrate  160 mg Oral Daily  . furosemide  20 mg Intravenous Q12H  . heparin  5,000 Units Subcutaneous Q8H  . lansoprazole  30 mg Oral BID AC  . levothyroxine  50 mcg Oral QAC breakfast  . [START ON 11/27/2015] lisinopril  10 mg Oral Daily  . montelukast  10 mg Oral QHS  . senna  1 tablet Oral BID  . sodium chloride flush  3 mL Intravenous Q12H  . sodium chloride flush  3 mL Intravenous Q12H  . timolol  1 drop Both Eyes BID   Continuous Infusions:  PRN Meds:.sodium chloride, acetaminophen **OR** acetaminophen, albuterol, fluticasone, ondansetron **OR** ondansetron (ZOFRAN) IV, polyethylene glycol, sodium chloride flush, traZODone Medications Prior to Admission:  Prior to Admission medications   Medication Sig Start Date End Date Taking? Authorizing Provider  amLODipine (NORVASC) 5 MG tablet Take 1 tablet (5 mg total) by mouth daily. 05/23/15  Yes  Janetta Hora, PA-C  aspirin EC 81 MG tablet Take 81 mg by mouth daily.   Yes Historical Provider, MD  atenolol (TENORMIN) 25 MG tablet TAKE (1/2) TABLET BY MOUTH TWICE DAILY. 12/19/14  Yes Lennette Bihari, MD  BETIMOL 0.5 % ophthalmic solution Place 1 drop into both eyes 2 (two) times daily.  06/06/14  Yes Historical Provider, MD  Calcium Citrate-Vitamin D (CITRACAL + D PO) Take 1 tablet by mouth daily.   Yes Historical Provider, MD  Choline Fenofibrate (FENOFIBRIC ACID) 135 MG CPDR TAKE ONE CAPSULE BY MOUTH ONCE DAILY. 10/16/15  Yes Lennette Bihari,  MD  fluticasone (FLONASE) 50 MCG/ACT nasal spray Place 1 spray into both nostrils 2 (two) times daily as needed for allergies.  04/10/14  Yes Historical Provider, MD  furosemide (LASIX) 20 MG tablet Take 0.5 tablets (10 mg total) by mouth daily. 08/23/15  Yes Troy Sine, MD  lansoprazole (PREVACID) 30 MG capsule TAKE 1 CAPSULE 2 TIMES DAILY BEFORE A MEAL AND OTHER MEDICATIONS. 11/18/15  Yes Rogene Houston, MD  levothyroxine (SYNTHROID, LEVOTHROID) 50 MCG tablet Take 50 mcg by mouth daily before breakfast.   Yes Historical Provider, MD  lisinopril (PRINIVIL,ZESTRIL) 5 MG tablet Take 1 tablet (5 mg total) by mouth daily. 09/05/15  Yes Troy Sine, MD  meclizine (ANTIVERT) 25 MG tablet Take 25 mg by mouth 4 (four) times daily as needed for dizziness or nausea.  11/08/12  Yes Historical Provider, MD  montelukast (SINGULAIR) 10 MG tablet Take 10 mg by mouth at bedtime.   Yes Historical Provider, MD  potassium chloride (KLOR-CON) 8 MEQ tablet TAKE (1) TABLET BY MOUTH TWICE DAILY. 06/17/15  Yes Troy Sine, MD  SALINE NASAL SPRAY NA Place into the nose. Patient states that she uses frequently.   Yes Historical Provider, MD   Allergies  Allergen Reactions  . Aspirin     High doses.  Mack Hook [Levofloxacin In D5w]     Fuzzy headed  . Pantoprazole    Review of Systems  Unable to perform ROS: Age    Physical Exam  Constitutional: She is oriented  to person, place, and time. No distress.  Frail and thin.   HENT:  Head: Normocephalic and atraumatic.  Cardiovascular: Normal rate and regular rhythm.   Pulmonary/Chest: Effort normal. No respiratory distress.  Abdominal: Soft. She exhibits no distension.  Musculoskeletal:  Muscle wasting.  Neurological: She is alert and oriented to person, place, and time.  Skin: Skin is warm and dry.  Nursing note and vitals reviewed.   Vital Signs: BP (!) 156/61 (BP Location: Left Arm)   Pulse 62   Temp 98 F (36.7 C) (Oral)   Resp 18   Ht 5\' 4"  (1.626 m)   Wt 46.3 kg (102 lb 1.6 oz)   SpO2 96%   BMI 17.53 kg/m  Pain Assessment: No/denies pain       SpO2: SpO2: 96 % O2 Device:SpO2: 96 % O2 Flow Rate: .O2 Flow Rate (L/min): 2 L/min  IO: Intake/output summary:  Intake/Output Summary (Last 24 hours) at 11/26/15 1434 Last data filed at 11/26/15 1200  Gross per 24 hour  Intake              480 ml  Output             1450 ml  Net             -970 ml    LBM: Last BM Date: 11/26/15 Baseline Weight: Weight: 49.9 kg (110 lb) Most recent weight: Weight: 46.3 kg (102 lb 1.6 oz)     Palliative Assessment/Data:   Flowsheet Rows   Flowsheet Row Most Recent Value  Intake Tab  Referral Department  Hospitalist  Unit at Time of Referral  Cardiac/Telemetry Unit  Palliative Care Primary Diagnosis  Cardiac  Date Notified  11/25/15  Palliative Care Type  New Palliative care  Reason for referral  Clarify Goals of Care, End of Life Care Assistance  Date of Admission  11/25/15  Date first seen by Palliative Care  11/26/15  # of days Palliative referral response  time  1 Day(s)  # of days IP prior to Palliative referral  0  Clinical Assessment  Palliative Performance Scale Score  40%  Pain Max last 24 hours  Not able to report  Pain Min Last 24 hours  Not able to report  Dyspnea Max Last 24 Hours  Not able to report  Dyspnea Min Last 24 hours  Not able to report  Psychosocial & Spiritual  Assessment  Palliative Care Outcomes  Patient/Family meeting held?  Yes  Who was at the meeting?  Patient and niece Zigmund Daniel  Palliative Care Outcomes  Counseled regarding hospice, Provided advance care planning, Provided psychosocial or spiritual support, Provided end of life care assistance, Clarified goals of care  Patient/Family wishes: Interventions discontinued/not started   Mechanical Ventilation, BiPAP, Tube feedings/TPN, PEG  Palliative Care follow-up planned  -- [Follow-up while at [facility]]      Time In: 1130 Time Out: 1220 Time Total: 50 minutes Greater than 50%  of this time was spent counseling and coordinating care related to the above assessment and plan.  Signed by: Drue Novel, NP   Please contact Palliative Medicine Team phone at [phone] for questions and concerns.  For individual provider: See Shea Evans

## 2015-11-26 NOTE — Clinical Social Work Note (Signed)
Clinical Social Work Assessment  Patient Details  Name: Brittany Wallace MRN: [id number] Date of Birth: Aug 11, 1920  Date of referral:  11/26/15               Reason for consult:  Facility Placement                Permission sought to share information with:    Permission granted to share information::     Name::        Agency::     Relationship::     Contact Information:  Neice and POA, Press photographer  Housing/Transportation Living arrangements for the past 2 months:  Single Family Home Source of Information:  Patient, Other (Comment Required) (Neice and POA [place]) Patient Interpreter Needed:  None Criminal Activity/Legal Involvement Pertinent to Current Situation/Hospitalization:  No - Comment as needed Significant Relationships:  Adult Children Lives with:  Relatives Do you feel safe going back to the place where you live?  Yes Need for family participation in patient care:  Yes (Comment)  Care giving concerns:  Patient lives along, however she has a caregiver who comes over on Monday and Tuesdays to help her with grocery shopping, mail reading and paying bills.    Social Worker assessment / plan:   Ms. Marcelino Scot and patient identified patient lives alone, ambulates with a walker and performs her ADLs as best as she can. Ms. Marcelino Scot advised that she is POA and visits patient about every other month. She stated that other relatives in the area visit more frequently.  CSW discussed PT recommendation. They advised that the desired for patient to go to [facility]. CSW discussed the Medicare requirement for a three night stay.   Employment status:  Retired Forensic scientist:  Medicare PT Recommendations:  [place] / Referral to community resources:  [place]  Patient/Family's Response to care:  Patient and family are agreeable to SNF and want PNC.   Patient/Family's Understanding of and Emotional Response to Diagnosis, Current Treatment, and  Prognosis:  Patient and family understand patient's diagnosis, treatment and prognosis and are comfortable with current discharge planning efforts.   Emotional Assessment Appearance:  Appears stated age Attitude/Demeanor/Rapport:   (Cooperative) Affect (typically observed):  Accepting Orientation:  Oriented to Self, Oriented to Place, Oriented to  Time, Oriented to Situation Alcohol / Substance use:  Not Applicable Psych involvement (Current and /or in the community):  No (Comment)  Discharge Needs  Concerns to be addressed:  No discharge needs identified Readmission within the last 30 days:  No Current discharge risk:  Lives alone Barriers to Discharge:  Other (Needs two additional nights for Medicare to pay,)   Ihor Gully, LCSW 11/26/2015, 12:52 PM

## 2015-11-26 NOTE — Evaluation (Addendum)
Physical Therapy Evaluation Patient Details Name: Brittany Wallace MRN: [id number] DOB: Mar 05, 1921 Today's Date: 11/26/2015   History of Present Illness  82 y.o. female, With past medical history relevant for systolic dysfunction CHF with EF in the 25% range as well as hypertension and hypothyroidism who presents with increasing shortness of breath and dyspnea on exertion especially over the last couple of days. Symptoms actually started a few weeks ago . No frank chest pains no palpitations patient does have some orthopnea but no paroxysmal nocturnal dyspnea, no significant lower extremity edema. No pleuritic symptoms, no fever no chills no productive cough.  Both pt and caregivers confirm that patient usually is compliant with low-salt diet.  Dx: Acute Systolic CHF exacerbation- last known EF 25% based on echocardiogram from March 2017.  PMH:  aortic stenosis, HTN, hypothyroidism, LBBB, MI 1995-1996, nonischemic cardiomyopathy, cholecystectomy.      Clinical Impression  Pt received in bed, nephew arrived at the end of evaluation.  Pt is very HOH, therefore history is somewhat difficult to obtain.  Pt expressed that she is modified independent with ambulation with rollator at home, and requires assistance for groceries, and finances.  Pt expressed that she has had 1 fall within the last few weeks where her R LE gave out on her when trying to pick up toilet paper off the floor while using BSC.  She also expressed to therapies that she feels that she may have had a stroke in the past, because she feels her R side is weaker.  She does c/o tingling all over her body at times, but no specific loss of sensation, or weakness noted during PT evaluation.  She expressed that she feels like her major deficits are memory, as well as balance.    During PT evaluation today, she was able to ambulate 45ft with RW, however she demonstrates desaturation of SpO2 to 85% but improved to 94% with seated rest and PLB.  Pt refused to  attempt any supplemental O2, stating that she does not want anything artificial.  At this point, she is not safe to d/c home due to desaturations, and risk of falling.  Therefore, she is recommended for SNF, which she is agreeable to.      Follow Up Recommendations SNF    Equipment Recommendations  None recommended by PT    Recommendations for Other Services       Precautions / Restrictions Precautions Precautions: Fall Precaution Comments: Pt states she had a fall over a year ago and has R sided weakness ever since and a minor fx in her R foot. [place] last week leaning over to pick up TP off the floor after using the BSC and R LE gave out.  Restrictions Weight Bearing Restrictions: No      Mobility  Bed Mobility Overal bed mobility: Modified Independent             General bed mobility comments: increased time, and use of bed rail to sit up.    Transfers Overall transfer level: Needs assistance Equipment used: Rolling walker (2 wheeled) (Pt normally uses a Rollator at home. ) Transfers: Sit to/from Stand Sit to Stand: Min assist         General transfer comment: vc's for hand placement - pt normally pulls up on her Rollator.  Pt was assisted into the bathoom, and required assistance for donning/doffing undergarments, but able to complete peri-hygiene independently.   Ambulation/Gait Ambulation/Gait assistance: Min guard Ambulation Distance (Feet): 15 Feet Assistive device: Rolling walker (  2 wheeled) Gait Pattern/deviations: Step-through pattern;Trunk flexed   Gait velocity interpretation: <1.8 ft/sec, indicative of risk for recurrent falls General Gait Details: pt demonstrates decreased cadence, as well as c/o SOB during mobility with desaturation of SpO2 down to 85%.  She is able to improve this with seated rest break, as well as vc's for PLB, and no talking.   Stairs            Wheelchair Mobility    Modified Rankin (Stroke Patients Only)        Balance Overall balance assessment: Needs assistance Sitting-balance support: Bilateral upper extremity supported Sitting balance-Leahy Scale: Fair     Standing balance support: Bilateral upper extremity supported Standing balance-Leahy Scale: Fair                               Pertinent Vitals/Pain Pain Assessment: No/denies pain    Home Living   Living Arrangements: Alone Available Help at Discharge: Personal care attendant (mondays and tuesdays in the afternoon - groceries, bills, ) Type of Home: House Home Access: Stairs to enter   [employer] of Steps: 1 step Home Layout: One level Home Equipment: Cane - single point;Walker - 4 wheels;Bedside commode      Prior Function     Gait / Transfers Assistance Needed: Pt uses a rollator to ambulate. No longer goes to the grocery store with her aide because of SOB  ADL's / Homemaking Assistance Needed: independent, with ADL's, but requires assistance for IADL's.  pt does her own cooking, cleaning, but requires assist for bills and getting out in the community.  No longer driving due to eye sight.          Hand Dominance   Dominant Hand: Right    Extremity/Trunk Assessment   Upper Extremity Assessment: Generalized weakness           Lower Extremity Assessment: Generalized weakness (Pt expressed that she has difficulty with her R LE giving out since her fall 1 year ago.   However strength feels to be equal on both sides with MMT and mobility observation. )         Communication   Communication: HOH  Cognition Arousal/Alertness: Awake/alert Behavior During Therapy: WFL for tasks assessed/performed Overall Cognitive Status: Within Functional Limits for tasks assessed       Memory: Decreased short-term memory (Pt reports that at home she frequently forgets what she is cooking, or what she enters a room to do. )              General Comments      Exercises         Assessment/Plan    PT Assessment Patient needs continued PT services  PT Diagnosis Difficulty walking;Generalized weakness;Abnormality of gait   PT Problem List Decreased strength;Decreased activity tolerance;Decreased balance;Decreased mobility;Decreased cognition;Decreased knowledge of use of DME;Decreased safety awareness;Decreased knowledge of precautions;Cardiopulmonary status limiting activity;Impaired sensation  PT Treatment Interventions DME instruction;Gait training;Functional mobility training;Therapeutic activities;Therapeutic exercise;Balance training;Patient/family education   PT Goals (Current goals can be found in the Care Plan section) Acute Rehab PT Goals Patient Stated Goal: Pt expressed she wants to go to rehab to get stronger.  PT Goal Formulation: With patient/family Time For Goal Achievement: 12/03/15 Potential to Achieve Goals: Fair    Frequency Min 3X/week   Barriers to discharge Decreased caregiver support Pt lives alone - never married, no children, but multiple nieces and nephews.  Co-evaluation PT/OT/SLP Co-Evaluation/Treatment: Yes Reason for Co-Treatment: Complexity of the patient's impairments (multi-system involvement) PT goals addressed during session: Mobility/safety with mobility;Balance;Proper use of DME         End of Session Equipment Utilized During Treatment: Gait belt Activity Tolerance: Patient limited by fatigue;Treatment limited secondary to medical complications (Comment) (SpO2 desaturation with mobility. ) Patient left: in chair;with call bell/phone within reach;with family/visitor present Nurse Communication: Mobility status (RN notified of desaturation of SpO2, and pt's location in the chair as well as toileting and mobility assistance required. )    Functional Assessment Tool Used: [employer] "6-clicks"  Functional Limitation: Mobility: Walking and moving around Mobility: Walking and Moving Around Current  Status [phone]): At least 40 percent but less than 60 percent impaired, limited or restricted Mobility: Walking and Moving Around Goal Status [phone]): At least 20 percent but less than 40 percent impaired, limited or restricted    Time: [phone] PT Time Calculation (min) (ACUTE ONLY): 43 min   Charges:   PT Evaluation $PT Eval Moderate Complexity: 1 Procedure PT Treatments $Gait Training: 8-22 mins $Therapeutic Activity: 8-22 mins   PT G Codes:   PT G-Codes **NOT FOR INPATIENT CLASS** Functional Assessment Tool Used: [employer] "6-clicks"  Functional Limitation: Mobility: Walking and moving around Mobility: Walking and Moving Around Current Status [phone]): At least 40 percent but less than 60 percent impaired, limited or restricted Mobility: Walking and Moving Around Goal Status [phone]): At least 20 percent but less than 40 percent impaired, limited or restricted    Beth England Greb, PT, DPT X: [phone]

## 2015-11-26 NOTE — Progress Notes (Signed)
PROGRESS NOTE    Brittany Wallace  [id number] DOB: [birth year] DOA: 11/25/2015 PCP: Rocky Morel, MD    Brief Narrative:   82 y.o. female, With past medical history relevant for systolic dysfunction CHF with EF in the 25% range as well as hypertension and hypothyroidism who presents with increasing shortness of breath and dyspnea on exertion especially over the last couple of days. Patient was admitted for workup and management of acute heart failure.  Assessment & Plan:   Principal Problem:   Systolic CHF, acute on chronic (HCC) Active Problems:   HTN (hypertension)   Hyperlipidemia   Non-ischemic cardiomyopathy (HCC)   Acute exacerbation of CHF (congestive heart failure) ([place])   1. Acutely decompensated chronic systolic congestive heart failure 1. Recent EF of 25-30% on 05/28/2015 2-D echocardiogram 2. Patient is continued on Lasix as tolerated 3. Weight on admission: 49.8 kg 4. Weight today: 46.3 kg 5. Continue to monitor I's and O's and daily weights 2. Hypertension 1. Blood pressure stable, albeit suboptimally controlled 2. Will increase lisinopril to 10 mg. Renal function thus far stable 3. Hyperlipidemia 1. Patient had been on fenofibrate prior to admission 4. Nonischemic cardiomyopathy 1. Continue to diuresis as per above 5. Hyponatremia 1. Patient's sodium of 133 2. Likely secondary to presenting heart failure 3. 2 new monitor basic metabolic panel closely  DVT prophylaxis: Heparin subcutaneous Code Status: DO NOT RESUSCITATE Family Communication: Patient in room, family at bedside Disposition Plan: Anticipate skilled nursing facility, timing uncertain  Consultants:   Palliative care  Procedures:     Antimicrobials: Anti-infectives    None       Subjective: Patient is without complaints today  Objective: Vitals:   11/25/15 2000 11/26/15 0421 11/26/15 0930 11/26/15 0954  BP:  (!) 171/67    Pulse:  64 75 60  Resp:  16    Temp:  97.8 F  (36.6 C)    TempSrc:  Oral    SpO2: 97% 97% (!) 85%   Weight:  46.3 kg (102 lb 1.6 oz)    Height:        Intake/Output Summary (Last 24 hours) at 11/26/15 1326 Last data filed at 11/26/15 0800  Gross per 24 hour  Intake              240 ml  Output              850 ml  Net             -610 ml   Filed Weights   11/25/15 1346 11/26/15 0421  Weight: 49.9 kg (110 lb) 46.3 kg (102 lb 1.6 oz)    Examination:  General exam: Appears calm and comfortable, Sitting in bed, are sent  Respiratory system: Clear to auscultation. Respiratory effort normal. Cardiovascular system: S1 & S2 heard, RRR. Gastrointestinal system: Abdomen is nondistended, soft and nontender. No organomegaly or masses felt. Normal bowel sounds heard. Central nervous system: Alert and oriented. No focal neurological deficits. Extremities: Symmetric 5 x 5 power. Skin: No rashes, lesions Psychiatry: Judgement and insight appear normal. Mood & affect appropriate.   Data Reviewed: I have personally reviewed following labs and imaging studies  CBC:  Recent Labs Lab 11/25/15 1435 11/26/15 0536  WBC 6.7 4.0  HGB 12.4 12.1  HCT 36.6 35.8*  MCV 92.0 92.7  PLT 309 [id number]   Basic Metabolic Panel:  Recent Labs Lab 11/25/15 1435 11/26/15 0536  NA 130* 133*  K 3.1* 3.5  CL 98* 101  CO2 20* 25  GLUCOSE 134* 94  BUN 11 10  CREATININE 0.60 0.48  CALCIUM 8.5* 8.6*   GFR: Estimated Creatinine Clearance: 32.1 mL/min (by C-G formula based on SCr of 0.8 mg/dL). Liver Function Tests: No results for input(s): AST, ALT, ALKPHOS, BILITOT, PROT, ALBUMIN in the last 168 hours. No results for input(s): LIPASE, AMYLASE in the last 168 hours. No results for input(s): AMMONIA in the last 168 hours. Coagulation Profile: No results for input(s): INR, PROTIME in the last 168 hours. Cardiac Enzymes:  Recent Labs Lab 11/25/15 1435 11/25/15 2350 11/26/15 0536  TROPONINI <0.03 <0.03 <0.03   BNP (last 3 results) No results  for input(s): PROBNP in the last 8760 hours. HbA1C: No results for input(s): HGBA1C in the last 72 hours. CBG: No results for input(s): GLUCAP in the last 168 hours. Lipid Profile: No results for input(s): CHOL, HDL, LDLCALC, TRIG, CHOLHDL, LDLDIRECT in the last 72 hours. Thyroid Function Tests:  Recent Labs  11/25/15 1848  TSH 2.531   Anemia Panel: No results for input(s): VITAMINB12, FOLATE, FERRITIN, TIBC, IRON, RETICCTPCT in the last 72 hours. Sepsis Labs: No results for input(s): PROCALCITON, LATICACIDVEN in the last 168 hours.  No results found for this or any previous visit (from the past 240 hour(s)).   Radiology Studies: Dg Chest 1 View  Result Date: 11/25/2015 CLINICAL DATA:  Shortness of breath, hypertension, prior MI, non ischemic cardiomyopathy EXAM: CHEST 1 VIEW COMPARISON:  Portable exam 1411 hours compared to 07/07/2012 FINDINGS: Enlargement of cardiac silhouette with pulmonary vascular congestion. Atherosclerotic calcification aorta. Perihilar interstitial infiltrates likely representing pulmonary edema and CHF. Bibasilar effusions and atelectasis. No pneumothorax. Bones demineralized. IMPRESSION: Probable CHF with bibasilar effusions and atelectasis. Electronically Signed   By: Lavonia Dana M.D.   On: 11/25/2015 14:22    Scheduled Meds: . amLODipine  5 mg Oral Daily  . aspirin EC  81 mg Oral Daily  . carvedilol  3.125 mg Oral BID WC  . feeding supplement (ENSURE ENLIVE)  237 mL Oral BID BM  . fenofibrate  160 mg Oral Daily  . furosemide  20 mg Intravenous Q12H  . heparin  5,000 Units Subcutaneous Q8H  . lansoprazole  30 mg Oral BID AC  . levothyroxine  50 mcg Oral QAC breakfast  . lisinopril  5 mg Oral Daily  . montelukast  10 mg Oral QHS  . senna  1 tablet Oral BID  . sodium chloride flush  3 mL Intravenous Q12H  . sodium chloride flush  3 mL Intravenous Q12H  . timolol  1 drop Both Eyes BID   Continuous Infusions:    LOS: 1 day   CHIU, Orpah Melter,  MD [facility] Pager [phone]  If 7PM-7AM, please contact night-coverage [email or web address] Password [email or web address] 11/26/2015, 1:26 PM

## 2015-11-26 NOTE — NC FL2 (Signed)
[place] MEDICAID FL2 LEVEL OF CARE SCREENING TOOL     IDENTIFICATION  Patient Name: Brittany Wallace Birthdate: January 23, 1921 Sex: female Admission Date (Current Location): 11/25/2015  [facility] [place] and [place] Number:  [employer] and Address:  [place] [address], [place]      Provider Number: [id number]  Attending Physician Name and Address:  Donne Hazel, MD  Relative Name and Phone Number:       Current Level of Care: Hospital Recommended Level of Care: [place] Prior Approval Number:    Date Approved/Denied:   PASRR Number: [id number] ([id number])  Discharge Plan: SNF    Current Diagnoses: Patient Active Problem List   Diagnosis Date Noted  . Acute exacerbation of CHF (congestive heart failure) ([place]) 11/25/2015  . Systolic CHF, acute on chronic ([place]) 11/25/2015  . Aortic valve disease 08/25/2015  . GERD (gastroesophageal reflux disease) 12/14/2013  . Nausea alone 11/13/2013  . HTN (hypertension) 12/28/2012  . Hyperlipidemia 12/28/2012  . Non-ischemic cardiomyopathy ([place]) 12/28/2012  . LBBB (left bundle branch block) 12/28/2012    Orientation RESPIRATION BLADDER Height & Weight     Self, Time, Situation, Place  Normal Continent Weight: 102 lb 1.6 oz (46.3 kg) Height:  5\' 4"  (162.6 cm)  BEHAVIORAL SYMPTOMS/MOOD NEUROLOGICAL BOWEL NUTRITION STATUS      Continent Diet (Heart Healthy)  AMBULATORY STATUS COMMUNICATION OF NEEDS Skin   Limited Assist Verbally Normal                       Personal Care Assistance Level of Assistance  Bathing, Feeding, Dressing Bathing Assistance: Limited assistance Feeding assistance: Limited assistance Dressing Assistance: Limited assistance     Functional Limitations Info  Sight, Hearing, Speech Sight Info: Adequate Hearing Info: Impaired Speech Info: Adequate    SPECIAL CARE FACTORS FREQUENCY  PT (By licensed PT)     PT Frequency: 5x/week               Contractures      Additional Factors Info  Code Status, Allergies Code Status Info: DNR Allergies Info: Aspirin, Levaquin, Pantoprazole           Current Medications (11/26/2015):  This is the current hospital active medication list Current Facility-Administered Medications  Medication Dose Route Frequency Provider Last Rate Last Dose  . 0.9 %  sodium chloride infusion  250 mL Intravenous PRN Roxan Hockey, MD      . acetaminophen (TYLENOL) tablet 650 mg  650 mg Oral Q6H PRN Roxan Hockey, MD       Or  . acetaminophen (TYLENOL) suppository 650 mg  650 mg Rectal Q6H PRN Courage Emokpae, MD      . albuterol (PROVENTIL) (2.5 MG/3ML) 0.083% nebulizer solution 2.5 mg  2.5 mg Nebulization Q2H PRN Courage Emokpae, MD      . amLODipine (NORVASC) tablet 5 mg  5 mg Oral Daily Roxan Hockey, MD   5 mg at 11/26/15 0959  . aspirin EC tablet 81 mg  81 mg Oral Daily Roxan Hockey, MD   81 mg at 11/26/15 0959  . carvedilol (COREG) tablet 3.125 mg  3.125 mg Oral BID WC Roxan Hockey, MD   3.125 mg at 11/26/15 0958  . feeding supplement (ENSURE ENLIVE) (ENSURE ENLIVE) liquid 237 mL  237 mL Oral BID BM Courage Emokpae, MD   237 mL at 11/26/15 1000  . fenofibrate tablet 160 mg  160 mg Oral Daily Roxan Hockey, MD  160 mg at 11/26/15 0959  . fluticasone (FLONASE) 50 MCG/ACT nasal spray 1 spray  1 spray Each Nare BID PRN Roxan Hockey, MD      . furosemide (LASIX) injection 20 mg  20 mg Intravenous Q12H Roxan Hockey, MD   20 mg at 11/26/15 0554  . heparin injection 5,000 Units  5,000 Units Subcutaneous Q8H Roxan Hockey, MD   5,000 Units at 11/26/15 0553  . lansoprazole (PREVACID) capsule 30 mg  30 mg Oral BID AC Donne Hazel, MD      . levothyroxine (SYNTHROID, LEVOTHROID) tablet 50 mcg  50 mcg Oral QAC breakfast Roxan Hockey, MD   50 mcg at 11/26/15 0959  . lisinopril (PRINIVIL,ZESTRIL) tablet 5 mg  5 mg Oral Daily Roxan Hockey, MD   5 mg at 11/26/15 0959  . montelukast  (SINGULAIR) tablet 10 mg  10 mg Oral QHS Roxan Hockey, MD   10 mg at 11/25/15 2124  . ondansetron (ZOFRAN) tablet 4 mg  4 mg Oral Q6H PRN Roxan Hockey, MD       Or  . ondansetron (ZOFRAN) injection 4 mg  4 mg Intravenous Q6H PRN Courage Emokpae, MD      . polyethylene glycol (MIRALAX / GLYCOLAX) packet 17 g  17 g Oral Daily PRN Roxan Hockey, MD      . senna (SENOKOT) tablet 8.6 mg  1 tablet Oral BID Roxan Hockey, MD   8.6 mg at 11/26/15 0959  . sodium chloride flush (NS) 0.9 % injection 3 mL  3 mL Intravenous Q12H Roxan Hockey, MD   3 mL at 11/25/15 2124  . sodium chloride flush (NS) 0.9 % injection 3 mL  3 mL Intravenous PRN Courage Emokpae, MD      . sodium chloride flush (NS) 0.9 % injection 3 mL  3 mL Intravenous Q12H Courage Emokpae, MD      . timolol (TIMOPTIC) 0.5 % ophthalmic solution 1 drop  1 drop Both Eyes BID Donne Hazel, MD      . traZODone (DESYREL) tablet 50 mg  50 mg Oral QHS PRN Roxan Hockey, MD         Discharge Medications: Please see discharge summary for a list of discharge medications.  Relevant Imaging Results:  Relevant Lab Results:   Additional Information SSN [id number] Clydene Pugh, LCSW

## 2015-11-26 NOTE — Evaluation (Signed)
Occupational Therapy Evaluation Patient Details Name: Brittany Wallace MRN: [id number] DOB: December 03, 1920 Today's Date: 11/26/2015    History of Present Illness 82 y.o. female, With past medical history relevant for systolic dysfunction CHF with EF in the 25% range as well as hypertension and hypothyroidism who presents with increasing shortness of breath and dyspnea on exertion especially over the last couple of days. Symptoms actually started a few weeks ago . No frank chest pains no palpitations patient does have some orthopnea but no paroxysmal nocturnal dyspnea, no significant lower extremity edema. No pleuritic symptoms, no fever no chills no productive cough.  Both pt and caregivers confirm that patient usually is compliant with low-salt diet.  Dx: Acute Systolic CHF exacerbation- last known EF 25% based on echocardiogram from March 2017.  PMH:  aortic stenosis, HTN, hypothyroidism, LBBB, MI 1995-1996, nonischemic cardiomyopathy, cholecystectomy.     Clinical Impression   Pt awake, alert, agreeable to OT/PT evaluation this morning. Nephew arrived towards end of the evaluation and reports concerns of pt safety in the home. Pt reports she completes ADL tasks independently at home, however is concerned with safety during functional mobility due to balance deficits and weakness. Pt has "helper" who comes twice per week to assist with household tasks, errands, and bills, however does not assist with ADLs at this time per pt and nephew. During evaluation required assistance with LB dressing and toileting tasks due to weakness and balance deficits. Pt experienced SpO2 desaturation to 85% during evaluation, improving to mid-90s with seated rest and PLB. Recommend SNF on discharge as pt is unsafe to return home alone at this time.       Follow Up Recommendations  SNF    Equipment Recommendations  None recommended by OT       Precautions / Restrictions Precautions Precautions: Fall Precaution Comments:  Pt states she had a fall over a year ago and has R sided weakness ever since and a minor fx in her R foot. [place] last week leaning over to pick up TP off the floor after using the BSC and R LE gave out.  Restrictions Weight Bearing Restrictions: No      Mobility Bed Mobility Overal bed mobility: Modified Independent             General bed mobility comments: increased time, and use of bed rail to sit up.    Transfers Overall transfer level: Needs assistance Equipment used: Rolling walker (2 wheeled) Transfers: Sit to/from Stand Sit to Stand: Min assist         General transfer comment: vc's for hand placement - pt normally pulls up on her Rollator.  Pt was assisted into the bathoom, and required assistance for donning/doffing undergarments, but able to complete peri-hygiene independently.     Balance Overall balance assessment: Needs assistance Sitting-balance support: Bilateral upper extremity supported Sitting balance-Leahy Scale: Fair     Standing balance support: Bilateral upper extremity supported Standing balance-Leahy Scale: Fair                              ADL Overall ADL's : Needs assistance/impaired Eating/Feeding: Set up;Bed level   Grooming: Wash/dry hands;Set up;Sitting (hand sanitizer)                   Toilet Transfer: Minimal assistance;Grab bars;RW   Toileting- Clothing Manipulation and Hygiene: Minimal assistance;Moderate assistance;Sit to/from stand Toileting - Clothing Manipulation Details (indicate cue type and reason): Min assist  for doffing, mod for donning     Functional mobility during ADLs: Min guard;Rolling walker       Vision Vision Assessment?: Vision impaired- to be further tested in functional context          Pertinent Vitals/Pain Pain Assessment: No/denies pain     Hand Dominance Right   Extremity/Trunk Assessment Upper Extremity Assessment Upper Extremity Assessment: Generalized weakness   Lower  Extremity Assessment Lower Extremity Assessment: Defer to PT evaluation       Communication Communication Communication: HOH   Cognition Arousal/Alertness: Awake/alert Behavior During Therapy: WFL for tasks assessed/performed Overall Cognitive Status: Within Functional Limits for tasks assessed       Memory: Decreased short-term memory (forgets what entered room to do at home)                        Home Living   Living Arrangements: Alone Available Help at Discharge:  (Mondays/Tuesdays for groceries and errands, bills) Type of Home: House Home Access: Stairs to enter Technical brewer of Steps: 1 step   Home Layout: One level     Bathroom Shower/Tub: Tub only (places table cloth and towels on floor to sponge bath)   Bathroom Toilet: Standard     Home Equipment: Cane - single point;Walker - 4 wheels;Bedside commode          Prior Functioning/Environment Level of Independence: Needs assistance  Gait / Transfers Assistance Needed: Pt uses a rollator to ambulate. No longer goes to the grocery store with her aide because of SOB ADL's / Homemaking Assistance Needed: independent, with ADL's, but requires assistance for IADL's.  pt does her own cooking, cleaning, but requires assist for bills and getting out in the community.  No longer driving due to eye sight.          OT Diagnosis: Generalized weakness   OT Problem List: Decreased strength;Decreased activity tolerance;Impaired balance (sitting and/or standing);Cardiopulmonary status limiting activity   OT Treatment/Interventions: Self-care/ADL training;Therapeutic exercise;DME and/or AE instruction;Patient/family education;Therapeutic activities    OT Goals(Current goals can be found in the care plan section) Acute Rehab OT Goals Patient Stated Goal: Pt expressed she wants to go to rehab to get stronger.  OT Goal Formulation: With patient Time For Goal Achievement: 12/10/15 Potential to Achieve Goals:  Good  OT Frequency: Min 2X/week           Co-evaluation PT/OT/SLP Co-Evaluation/Treatment: Yes Reason for Co-Treatment: Complexity of the patient's impairments (multi-system involvement) PT goals addressed during session: Mobility/safety with mobility;Balance;Proper use of DME OT goals addressed during session: ADL's and self-care;Proper use of Adaptive equipment and DME      End of Session Equipment Utilized During Treatment: Gait belt;Rolling walker  Activity Tolerance: Patient tolerated treatment well Patient left: in chair;with call bell/phone within reach;with family/visitor present   Time: [id number] OT Time Calculation (min): 38 min Charges:  OT General Charges $OT Visit: 1 Procedure OT Evaluation $OT Eval Moderate Complexity: 1 Procedure  Guadelupe Sabin, OTR/L  [phone] 11/26/2015, 12:57 PM

## 2015-11-26 NOTE — Progress Notes (Signed)
Initial Nutrition Assessment  DOCUMENTATION CODES:   Non-severe (moderate) malnutrition in context of acute illness/injury  Underweight  INTERVENTION:  Ensure Enlive po BID, each supplement provides 350 kcal and 20 grams of protein   Heart Healthy diet   Obtain food preferences each meal   Offer snack q hs  Add MVI  NUTRITION DIAGNOSIS:   Increased nutrient needs related to chronic illness as evidenced by estimated needs.   GOAL:   Patient will meet greater than or equal to 90% of their needs   MONITOR:   PO intake, Labs, Weight trends, Supplement acceptance  REASON FOR ASSESSMENT:   Malnutrition Screening Tool    ASSESSMENT:  Patient is a very pleasant 82 yo single female. Her caregiver is here during my visit.  Brittany Wallace says her appetite has been poor especially the past week. She says the shortness of breath made it difficult to eat as she usually would. She is able to feed herself. Home diet is regular.  Diet hx: breakfast is either cereal or oatmeal and sometimes eggs and toast. She eats her main meal at lunch. She visits the local cafeteria and purchases enough to split into 2 meals. According to pt she mainly eats vegetables and is unable to tell me how she gets her protein in each day.   Her weight has decreased significantly since admission which is likely a combination of her poor intake and diuresis (49.9- 46.3 kg).     Nutrition-Focused physical exam completed. Findings are moderate fat depletion, moderate muscle depletion, and no edema. She ambulates with a walker usually.  Pt meets criteria for moderate MALNUTRITION in the context of acute on chronic CHF as evidenced by moderate depletions of muscle and fat mass.   Recent Labs Lab 11/25/15 1435 11/26/15 0536  NA 130* 133*  K 3.1* 3.5  CL 98* 101  CO2 20* 25  BUN 11 10  CREATININE 0.60 0.48  CALCIUM 8.5* 8.6*  GLUCOSE 134* 94   Labs: sodium 133 which is up from 130 yesterday.  Meds: lasix,  prevacid, senokot   Diet Order:  Diet Heart Room service appropriate? Yes; Fluid consistency: Thin  Skin:  Reviewed, no issues  Last BM:  9/5  large  Height:   Ht Readings from Last 1 Encounters:  11/25/15 5\' 4"  (1.626 m)    Weight:   Wt Readings from Last 1 Encounters:  11/26/15 102 lb 1.6 oz (46.3 kg)    Ideal Body Weight:  54.5 kg  BMI:  Body mass index is 17.53 kg/m.  Estimated Nutritional Needs:   Kcal:  1380-1610   Protein:  60-65 gr  Fluid:  1200  EDUCATION NEEDS:   Education needs addressed  Brittany Cater Brittany,RD,CSG,LDN Office: [phone] Pager: [phone]

## 2015-11-26 NOTE — Care Management Note (Signed)
Case Management Note  Patient Details  Name: Anik Luevano MRN: [id number] Date of Birth: [birth year]  Subjective/Objective:                  Admitted with CHF. Pt is originally from home with self care. PT has made SNF recommendation. CSW is aware and has made arrangements for placement at DC.   Action/Plan: No CM needs anticipated.  Expected Discharge Date:  11/29/15               Expected Discharge Plan:  Skilled Nursing Facility  In-House Referral:  Clinical Social Work  Discharge planning Services  CM Consult  Post Acute Care Choice:  NA Choice offered to:  NA  DME Arranged:    DME Agency:     HH Arranged:    [place] Agency:     Status of Service:  Completed, signed off  If discussed at [employer], dates discussed:    Additional Comments:  Sherald Barge, RN 11/26/2015, 1:45 PM

## 2015-11-27 LAB — BASIC METABOLIC PANEL
Anion gap: 7 (ref 5–15)
BUN: 20 mg/dL (ref 6–20)
CHLORIDE: 100 mmol/L — AB (ref 101–111)
CO2: 30 mmol/L (ref 22–32)
CREATININE: 0.58 mg/dL (ref 0.44–1.00)
Calcium: 9.2 mg/dL (ref 8.9–10.3)
GFR calc Af Amer: >60 mL/min (ref >60)
GFR calc non Af Amer: >60 mL/min (ref >60)
GLUCOSE: 103 mg/dL — AB (ref 65–99)
POTASSIUM: 3.9 mmol/L (ref 3.5–5.1)
SODIUM: 137 mmol/L (ref 135–145)

## 2015-11-27 MED ORDER — HYDRALAZINE HCL 20 MG/ML IJ SOLN: 5.0000 mg | Freq: Four times a day (QID) | INTRAMUSCULAR | Status: DC | PRN

## 2015-11-27 MED ORDER — LOSARTAN POTASSIUM 50 MG PO TABS
50.0000 mg | ORAL_TABLET | Freq: Every day | ORAL | Status: DC
  Administered 2015-11-27 – 2015-11-28 (×2): 50 mg via ORAL
  Filled 2015-11-27 (×2): qty 1

## 2015-11-27 NOTE — Progress Notes (Signed)
Occupational Therapy Treatment Patient Details Name: Brittany Wallace MRN: [id number] DOB: 12-23-1920 Today's Date: 11/27/2015    History of present illness 82 y.o. female, With past medical history relevant for systolic dysfunction CHF with EF in the 25% range as well as hypertension and hypothyroidism who presents with increasing shortness of breath and dyspnea on exertion especially over the last couple of days. Symptoms actually started a few weeks ago . No frank chest pains no palpitations patient does have some orthopnea but no paroxysmal nocturnal dyspnea, no significant lower extremity edema. No pleuritic symptoms, no fever no chills no productive cough.  Both pt and caregivers confirm that patient usually is compliant with low-salt diet.  Dx: Acute Systolic CHF exacerbation- last known EF 25% based on echocardiogram from March 2017.  PMH:  aortic stenosis, HTN, hypothyroidism, LBBB, MI 1995-1996, nonischemic cardiomyopathy, cholecystectomy.     OT comments  Pt awake, alert, sitting at EOB when OT entered this am. Pt expressed interest in completing morning ADL tasks, however was unable to stand for tasks due to feeling "light headed." Pt required set up for tasks due to vision issues with macular degeneration. Pt was able to ambulate 3 feet to [facility] for toileting tasks with min guard. Discharge plan remains appropriate.    Follow Up Recommendations  SNF    Equipment Recommendations  None recommended by OT       Precautions / Restrictions Precautions Precautions: Fall Restrictions Weight Bearing Restrictions: No       Mobility Bed Mobility Overal bed mobility: Modified Independent                Transfers Overall transfer level: Needs assistance Equipment used: Rolling walker (2 wheeled) Transfers: Sit to/from Stand Sit to Stand: Min guard                  ADL Overall ADL's : Needs assistance/impaired     Grooming: Wash/dry hands;Wash/dry face;Oral care;Brushing  hair;Set up;Sitting Grooming Details (indicate cue type and reason): Pt required set-up for seated grooming tasks due to vision issues and feeling "off" in her head today.                  Toilet Transfer: Agricultural consultant Details (indicate cue type and reason): Pt unable to ambulate to bathroom due to feeling lightheaded, assist to bedside commode Toileting- Clothing Manipulation and Hygiene: Min guard;Sit to/from stand       Functional mobility during ADLs: Min guard;Rolling walker                  Cognition   Behavior During Therapy: WFL for tasks assessed/performed Overall Cognitive Status: Within Functional Limits for tasks assessed       Memory: Decreased short-term memory                            Pertinent Vitals/ Pain       Pain Assessment: No/denies pain         Frequency Min 2X/week     Progress Toward Goals  OT Goals(current goals can now be found in the care plan section)  Progress towards OT goals: Progressing toward goals  ADL Goals Pt Will Perform Grooming: with set-up;standing Pt Will Perform Lower Body Dressing: with supervision;sit to/from stand Pt Will Transfer to Toilet: with supervision;ambulating;regular height toilet;grab bars  Plan Discharge plan remains appropriate       End of Session Equipment Utilized During Treatment: Gait  belt;Rolling walker   Activity Tolerance Patient tolerated treatment well   Patient Left in bed;with call bell/phone within reach;with bed alarm set           Time: [id number] OT Time Calculation (min): 34 min  Charges: OT General Charges $OT Visit: 1 Procedure OT Treatments $Self Care/Home Management : 23-37 mins   Guadelupe Sabin, OTR/L  [phone] 11/27/2015, 1:00 PM

## 2015-11-27 NOTE — Progress Notes (Signed)
Patient ID: Brittany Wallace, female   DOB: January 20, 1921, 82 y.o.   MRN: [id number]                                                                PROGRESS NOTE                                                                                                                                                                                                             Patient Demographics:    Brittany Wallace, is a 82 y.o. female, DOB - 18-Jul-1920, [id number]  Admit date - 11/25/2015   Admitting Physician Courage Denton Brick, MD  Outpatient Primary MD for the patient is Rocky Morel, MD  LOS - 2  Outpatient Specialists:  Chief Complaint  Patient presents with  . Shortness of Breath       Brief Narrative  82 y.o.female,With past medical history relevant for systolic dysfunction CHF with EF in the 25% range as well as hypertension and hypothyroidism who presents with increasing shortness of breath and dyspnea on exertion especially over the last couple of days. Patient was admitted for workup and management of acute heart failure.   Subjective:    Brittany Wallace today has better breathing. , No headache, No chest pain, No abdominal pain - No Nausea, No new weakness tingling or numbness, No Cough - SOB.    Assessment  & Plan :    Principal Problem:   Systolic CHF, acute on chronic (HCC) Active Problems:   HTN (hypertension)   Hyperlipidemia   Non-ischemic cardiomyopathy (HCC)   Acute exacerbation of CHF (congestive heart failure) ([place])   Palliative care encounter   Goals of care, counseling/discussion   DNR (do not resuscitate) discussion   Malnutrition of moderate degree   1. Acutely decompensated chronic systolic congestive heart failure 1. Recent EF of 25-30% on 05/28/2015 2-D echocardiogram 2. Patient is continued on Lasix as tolerated 3. Weight on admission: 49.8 kg 4. Weight today: 46.3 kg 5. Continue to monitor I's and O's and daily weights 2. Hypertension uncontrolled, 1. Stop  lisinopril, use losartan 50mg  po qday, consider later changing to ENTRESTO 3. Hyperlipidemia 1. Patient had been on fenofibrate prior to admission 4. Nonischemic cardiomyopathy 1. Continue to diuresis as per above 5. Hyponatremia 1. Improved.  2. Likely secondary  to presenting heart failure, Check cmp in am  DVT prophylaxis: Heparin subcutaneous Code Status: DO NOT RESUSCITATE Family Communication: Patient in room, family at bedside Disposition Plan: Anticipate skilled nursing facility, timing uncertain     Lab Results  Component Value Date   PLT 235 11/26/2015    Antibiotics  :   Anti-infectives    None        Objective:   Vitals:   11/26/15 1330 11/26/15 1929 11/26/15 1955 11/27/15 0410  BP: (!) 156/61  (!) 157/73 (!) 175/88  Pulse: 62  72 82  Resp: 18  18 16   Temp: 98 F (36.7 C)  97.7 F (36.5 C) 97.7 F (36.5 C)  TempSrc: Oral  Oral Oral  SpO2: 96% 95% 94% 97%  Weight:    46.5 kg (102 lb 8 oz)  Height:        Wt Readings from Last 3 Encounters:  11/27/15 46.5 kg (102 lb 8 oz)  09/24/15 50.8 kg (112 lb)  08/23/15 51 kg (112 lb 6.4 oz)     Intake/Output Summary (Last 24 hours) at 11/27/15 [id number] Last data filed at 11/27/15 0656  Gross per 24 hour  Intake              720 ml  Output             2950 ml  Net            -2230 ml     Physical Exam  Awake Alert, Oriented X 3, No new F.N deficits, Normal affect [place].AT,PERRAL Supple Neck,No JVD, No cervical lymphadenopathy appriciated.  Symmetrical Chest wall movement, Good air movement bilaterally, CTAB RRR,No Gallops,Rubs or new Murmurs, No Parasternal Heave +ve B.Sounds, Abd Soft, No tenderness, No organomegaly appriciated, No rebound - guarding or rigidity. No Cyanosis, Clubbing or edema, No new Rash or bruise     Data Review:    CBC  Recent Labs Lab 11/25/15 1435 11/26/15 0536  WBC 6.7 4.0  HGB 12.4 12.1  HCT 36.6 35.8*  PLT 309 235  MCV 92.0 92.7  MCH 31.2 31.3  MCHC 33.9 33.8    RDW 14.2 13.8    Chemistries   Recent Labs Lab 11/25/15 1435 11/26/15 0536 11/27/15 0608  NA 130* 133* 137  K 3.1* 3.5 3.9  CL 98* 101 100*  CO2 20* 25 30  GLUCOSE 134* 94 103*  BUN 11 10 20   CREATININE 0.60 0.48 0.58  CALCIUM 8.5* 8.6* 9.2   ------------------------------------------------------------------------------------------------------------------ No results for input(s): CHOL, HDL, LDLCALC, TRIG, CHOLHDL, LDLDIRECT in the last 72 hours.  No results found for: HGBA1C ------------------------------------------------------------------------------------------------------------------  Recent Labs  11/25/15 1848  TSH 2.531   ------------------------------------------------------------------------------------------------------------------ No results for input(s): VITAMINB12, FOLATE, FERRITIN, TIBC, IRON, RETICCTPCT in the last 72 hours.  Coagulation profile No results for input(s): INR, PROTIME in the last 168 hours.  No results for input(s): DDIMER in the last 72 hours.  Cardiac Enzymes  Recent Labs Lab 11/25/15 1435 11/25/15 2350 11/26/15 0536  TROPONINI <0.03 <0.03 <0.03   ------------------------------------------------------------------------------------------------------------------    Component Value Date/Time   BNP 1,527.0 (H) 11/25/2015 1435    Inpatient Medications  Scheduled Meds: . amLODipine  5 mg Oral Daily  . aspirin EC  81 mg Oral Daily  . carvedilol  3.125 mg Oral BID WC  . feeding supplement (ENSURE ENLIVE)  237 mL Oral BID BM  . fenofibrate  160 mg Oral Daily  . furosemide  20 mg Intravenous Q12H  .  heparin  5,000 Units Subcutaneous Q8H  . lansoprazole  30 mg Oral BID AC  . levothyroxine  50 mcg Oral QAC breakfast  . lisinopril  10 mg Oral Daily  . montelukast  10 mg Oral QHS  . multivitamin with minerals  1 tablet Oral Daily  . senna  1 tablet Oral BID  . sodium chloride flush  3 mL Intravenous Q12H  . sodium chloride flush   3 mL Intravenous Q12H  . timolol  1 drop Both Eyes BID   Continuous Infusions:  PRN Meds:.sodium chloride, acetaminophen **OR** acetaminophen, albuterol, fluticasone, ondansetron **OR** ondansetron (ZOFRAN) IV, polyethylene glycol, sodium chloride flush, traZODone  Micro Results No results found for this or any previous visit (from the past 240 hour(s)).  Radiology Reports Dg Chest 1 View  Result Date: 11/25/2015 CLINICAL DATA:  Shortness of breath, hypertension, prior MI, non ischemic cardiomyopathy EXAM: CHEST 1 VIEW COMPARISON:  Portable exam 1411 hours compared to 07/07/2012 FINDINGS: Enlargement of cardiac silhouette with pulmonary vascular congestion. Atherosclerotic calcification aorta. Perihilar interstitial infiltrates likely representing pulmonary edema and CHF. Bibasilar effusions and atelectasis. No pneumothorax. Bones demineralized. IMPRESSION: Probable CHF with bibasilar effusions and atelectasis. Electronically Signed   By: Lavonia Dana M.D.   On: 11/25/2015 14:22    Time Spent in minutes  30   Jani Gravel M.D on 11/27/2015 at 7:22 AM  Between 7am to 7pm - Pager - [phone]  After 7pm go to [email or web address] - password [facility]  [facility] -  Office  [phone]

## 2015-11-27 NOTE — Progress Notes (Signed)
Daily Progress Note   Patient Name: Brittany Wallace       Date: 11/27/2015 DOB: April 14, 1920  Age: 82 y.o. MRN#: [id number] Attending Physician: Jani Gravel, MD Primary Care Physician: Rocky Morel, MD Admit Date: 11/25/2015  Reason for Consultation/Follow-up: Disposition, Establishing goals of care and Psychosocial/spiritual support  Subjective: Brittany Wallace is resting quietly in bed. No family at bedside at this time. I share that she has been accepted to [facility], and my hope that she improves during her rehab stay.  She shares her difficulty in living alone over the last few years.  She states, "it's not home, but I will adjust". She tells me that she is pleased that she will be around people, stating that she enjoys hearing the nursing staff talk during the nighttime. She states that she would like to bring her small recliner to the [facility] with her.  I meet niece/durable power of attorney, Inge Rise, in the hallway. We talk at length about Medicare versus Medicaid, spend down information, 20 days of rehab at no cost, then 80 days provided if Brittany Wallace still needs rehab. I encourage Zigmund Daniel to seek the advice of elder law attorney if she feels she needs support and direction. That this is an appropriate way to use Brittany Wallace's money.  Brittany Wallace's mother was in [facility] for 12 years, she states she has a strong relationship with the social workers on site.  Length of Stay: 2  Current Medications: Scheduled Meds:  . amLODipine  5 mg Oral Daily  . aspirin EC  81 mg Oral Daily  . carvedilol  3.125 mg Oral BID WC  . feeding supplement (ENSURE ENLIVE)  237 mL Oral BID BM  . fenofibrate  160 mg Oral Daily  . furosemide  20 mg Intravenous Q12H  . heparin  5,000 Units Subcutaneous Q8H  . lansoprazole  30 mg Oral BID AC  .  levothyroxine  50 mcg Oral QAC breakfast  . losartan  50 mg Oral Daily  . montelukast  10 mg Oral QHS  . multivitamin with minerals  1 tablet Oral Daily  . senna  1 tablet Oral BID  . sodium chloride flush  3 mL Intravenous Q12H  . sodium chloride flush  3 mL Intravenous Q12H  . timolol  1 drop Both Eyes BID  Continuous Infusions:    PRN Meds: sodium chloride, acetaminophen **OR** acetaminophen, albuterol, fluticasone, hydrALAZINE, ondansetron **OR** ondansetron (ZOFRAN) IV, polyethylene glycol, sodium chloride flush, traZODone  Physical Exam  Constitutional: She is oriented to person, place, and time. No distress.  Frail and thin, appears to be weak.  HENT:  Hearing loss, right ear = best ear  Eyes:  Blind due to macular degeneration  Cardiovascular: Normal rate.   Pulmonary/Chest: Effort normal. No respiratory distress.  Abdominal: Soft. She exhibits no distension.  Musculoskeletal:  Muscle wasting  Neurological: She is alert and oriented to person, place, and time.  Skin: Skin is warm and dry.  Nursing note and vitals reviewed.           Vital Signs: BP (!) 138/55   Pulse 70   Temp 97.7 F (36.5 C) (Oral)   Resp 16   Ht 5\' 4"  (1.626 m)   Wt 46.5 kg (102 lb 8 oz)   SpO2 97%   BMI 17.59 kg/m  SpO2: SpO2: 97 % O2 Device: O2 Device: Not Delivered O2 Flow Rate: O2 Flow Rate (L/min): 2 L/min  Intake/output summary:  Intake/Output Summary (Last 24 hours) at 11/27/15 1352 Last data filed at 11/27/15 1227  Gross per 24 hour  Intake              723 ml  Output             2200 ml  Net            -1477 ml   LBM: Last BM Date: 11/26/15 Baseline Weight: Weight: 49.9 kg (110 lb) Most recent weight: Weight: 46.5 kg (102 lb 8 oz)       Palliative Assessment/Data:    Flowsheet Rows   Flowsheet Row Most Recent Value  Intake Tab  Referral Department  Hospitalist  Unit at Time of Referral  Cardiac/Telemetry Unit  Palliative Care Primary Diagnosis  Cardiac  Date  Notified  11/25/15  Palliative Care Type  New Palliative care  Reason for referral  Clarify Goals of Care, End of Life Care Assistance  Date of Admission  11/25/15  Date first seen by Palliative Care  11/26/15  # of days Palliative referral response time  1 Day(s)  # of days IP prior to Palliative referral  0  Clinical Assessment  Palliative Performance Scale Score  40%  Pain Max last 24 hours  Not able to report  Pain Min Last 24 hours  Not able to report  Dyspnea Max Last 24 Hours  Not able to report  Dyspnea Min Last 24 hours  Not able to report  Psychosocial & Spiritual Assessment  Palliative Care Outcomes  Patient/Family meeting held?  Yes  Who was at the meeting?  Patient and niece Zigmund Daniel  Palliative Care Outcomes  Counseled regarding hospice, Provided advance care planning, Provided psychosocial or spiritual support, Provided end of life care assistance, Clarified goals of care  Patient/Family wishes: Interventions discontinued/not started   Mechanical Ventilation, BiPAP, Tube feedings/TPN, PEG  Palliative Care follow-up planned  -- [Follow-up while at [facility]]      Patient Active Problem List   Diagnosis Date Noted  . Malnutrition of moderate degree 11/26/2015  . Palliative care encounter   . Goals of care, counseling/discussion   . DNR (do not resuscitate) discussion   . Acute exacerbation of CHF (congestive heart failure) ([place]) 11/25/2015  . Systolic CHF, acute on chronic ([place]) 11/25/2015  . Aortic valve disease 08/25/2015  . GERD (gastroesophageal  reflux disease) 12/14/2013  . Nausea alone 11/13/2013  . HTN (hypertension) 12/28/2012  . Hyperlipidemia 12/28/2012  . Non-ischemic cardiomyopathy ([place]) 12/28/2012  . LBBB (left bundle branch block) 12/28/2012    Palliative Care Assessment & Plan   Patient Profile: 81 y.o. female  with past medical history of Hypertension, hyperlipidemia, nonischemic cardiomyopathy, and systolic heart failure admitted on 11/25/2015 with acute  exacerbation of heart failure.  Assessment: As above  Recommendations/Plan:  continue to treat the treatable, but no extraordinary measures such as CPR, intubation, peg tube, blood transfusion, "nothing artificial", including oxygen dependency.  Goals of Care and Additional Recommendations:  Limitations on Scope of Treatment: Avoid Hospitalization, Minimize Medications, No Artificial Feeding, No Blood Transfusions, No Chemotherapy and No Hemodialysis  Code Status:    Code Status Orders        Start     Ordered   11/25/15 1759  Do not attempt resuscitation (DNR)  Continuous    Question Answer Comment  In the event of cardiac or respiratory ARREST Do not call a "code blue"   In the event of cardiac or respiratory ARREST Do not perform Intubation, CPR, defibrillation or ACLS   In the event of cardiac or respiratory ARREST Use medication by any route, position, wound care, and other measures to relive pain and suffering. May use oxygen, suction and manual treatment of airway obstruction as needed for comfort.   Comments see universal DNR form      11/25/15 1758    Code Status History    Date Active Date Inactive Code Status Order ID Comments User Context   This patient has a current code status but no historical code status.       Prognosis:   < 12 months, likely based on chronic illness burden, frailty, and Ms. Walther's desire to not have life-prolonging measures.  Ms. Hodgman will likely experience improvement in her physical, mental, and spiritual health as a resident of [facility].  Discharge Planning:  [facility] for rehab, then stay on as resident if possible  Care plan was discussed with nursing staff, case manager, social worker, and Dr. Maudie Mercury on next rounds.  Thank you for allowing the Palliative Medicine Team to assist in the care of this patient.   Time In: 1010 Time Out: 1035 Total Time 25 minutes  Prolonged Time Billed  no       Greater than 50%  of this time was spent  counseling and coordinating care related to the above assessment and plan.  Drue Novel, NP  Please contact Palliative Medicine Team phone at [phone] for questions and concerns.

## 2015-11-27 NOTE — Care Management Important Message (Signed)
Important Message  Patient Details  Name: Piya Vansant MRN: [id number] Date of Birth: Oct 26, 1920   Medicare Important Message Given:  Yes    Sherald Barge, RN 11/27/2015, 11:05 AM

## 2015-11-27 NOTE — Clinical Social Work Note (Signed)
CSW notified patient of the bed offer at [facility] and patient accepted.     Jeralynn Vaquera, Clydene Pugh, LCSW

## 2015-11-28 DIAGNOSIS — I10 Essential (primary) hypertension: Secondary | ICD-10-CM

## 2015-11-28 DIAGNOSIS — I5023 Acute on chronic systolic (congestive) heart failure: Secondary | ICD-10-CM

## 2015-11-28 DIAGNOSIS — Z7189 Other specified counseling: Secondary | ICD-10-CM

## 2015-11-28 MED ORDER — LOSARTAN POTASSIUM 50 MG PO TABS
25.0000 mg | ORAL_TABLET | Freq: Every day | ORAL | Status: DC
  Administered 2015-11-29: 25 mg via ORAL
  Filled 2015-11-28: qty 1

## 2015-11-28 NOTE — Consult Note (Signed)
   [facility] CM Inpatient Consult   11/28/2015  Faraday Balster 01-17-1921 [id number]  Patient screened for potential [place] [facility] Management services. Patient is eligible for [place]. Electronic medical record reveals patient's discharge plan is for skilled nursing facility,  there were no identifiable [facility] care management needs at this time. [facility] Care Management services not appropriate at this time. If patient's post hospital needs change please place a [facility] Care Management consult. For questions please contact:   Royetta Crochet. Laymond Purser, RN, BSN, [facility] Liaison [phone]

## 2015-11-28 NOTE — Progress Notes (Signed)
PROGRESS NOTE    Brittany Wallace  [id number] DOB: 1920/06/04 DOA: 11/25/2015 PCP: Rocky Morel, MD    Brief Narrative:  82 y.o.female,With past medical history relevant for systolic dysfunction CHF with EF in the 25% range as well as hypertension and hypothyroidism who presents with increasing shortness of breath and dyspnea on exertion especially over the last couple of days.Patient was admitted for workup and management of acute heart failure.   Assessment & Plan:   Principal Problem:   Systolic CHF, acute on chronic (HCC) Active Problems:   HTN (hypertension)   Hyperlipidemia   Non-ischemic cardiomyopathy (HCC)   Acute exacerbation of CHF (congestive heart failure) ([place])   Palliative care encounter   Goals of care, counseling/discussion   DNR (do not resuscitate) discussion   Malnutrition of moderate degree   Acutely decompensated chronic systolic congestive heart failure - Recent EF of 25-30% on 05/28/2015 2-D echocardiogram - Patient is continued on Lasix as tolerated - Weight on admission: 49.8 kg - Weight today: 46.4 kg - Continue to monitor I's and O's and daily weight (net of -1028 over the last 24 hours) - will decrease lasix to IV daily - Blood pressure slightly low this morning and patient voices she feels like her BP is low - Will decrease losartan to 25mg  daily - Stop lisinopril, use losartan 50mg  po qday, consider later changing to ENTRESTO  Hyperlipidemia - Patient had been on fenofibrate prior to admission  Nonischemic cardiomyopathy - diuresed well - will decrease IV Lasix to daily  Hyponatremia - Improved.  - Likely secondary to presenting heart failure, Check cmp in am    DVT prophylaxis: heparin Code Status: DNR Family Communication: patients nephew Quita Skye is bedside Disposition Plan: anticipate discharge to SNF in the next 24-48 horus   Consultants:   none  Procedures:   none  Antimicrobials:   none     Subjective: Patient is awake and talking to her nephew at time of exam. She asked many questions about her blood pressure and how long in terms of months she could have if she stopped taking her medication.  Patient voices that she feels ready to die and wants to know how long she has left.  She feels tired of taking all the medications she takes.  She voices that she has no children and no husband and does not want to be a burden to her nieces and nephews.  Does understand that she will discharge to a skilled nursing facility at time of discharge.  Mentions today her head "doesn't feel right" when she is sitting up and believes this is related to her blood pressure.  Says her blood pressure at home is never as low as 116.  Mentions her medications have all been changed and since they have been changed she feels as though she isn't feeling back to normal.  She voices that she occasionally has memory lapses at home and wants to know if this is related to her blood pressure or her heart failure.  Objective: Vitals:   11/27/15 2227 11/28/15 0650 11/28/15 0806 11/28/15 1039  BP: (!) 169/74 (!) 147/68 (!) 184/76 116/65  Pulse: 66 74 72   Resp: 20 15 18    Temp: 97.5 F (36.4 C) 97.4 F (36.3 C)    TempSrc: Oral Oral    SpO2: 100% 100% 99%   Weight:  46.4 kg (102 lb 4.8 oz)    Height:        Intake/Output Summary (Last 24 hours)  at 11/28/15 1403 Last data filed at 11/28/15 1300  Gross per 24 hour  Intake              723 ml  Output             1751 ml  Net            -1028 ml   Filed Weights   11/26/15 0421 11/27/15 0410 11/28/15 0650  Weight: 46.3 kg (102 lb 1.6 oz) 46.5 kg (102 lb 8 oz) 46.4 kg (102 lb 4.8 oz)    Examination:  General exam: Appears calm and comfortable  Respiratory system: Clear to auscultation. Respiratory effort normal. Cardiovascular system: S1 & S2 heard, RRR. No JVD, murmurs, rubs, gallops or clicks. No pedal edema. Gastrointestinal system: Abdomen is  nondistended, soft and nontender. No organomegaly or masses felt. Normal bowel sounds heard. Central nervous system: Alert and oriented. No focal neurological deficits. Extremities: Symmetric 5 x 5 power. Skin: No rashes, lesions or ulcers Psychiatry: Judgement and insight appear normal. Mood & affect appropriate.     Data Reviewed: I have personally reviewed following labs and imaging studies  CBC:  Recent Labs Lab 11/25/15 1435 11/26/15 0536  WBC 6.7 4.0  HGB 12.4 12.1  HCT 36.6 35.8*  MCV 92.0 92.7  PLT 309 [id number]   Basic Metabolic Panel:  Recent Labs Lab 11/25/15 1435 11/26/15 0536 11/27/15 0608  NA 130* 133* 137  K 3.1* 3.5 3.9  CL 98* 101 100*  CO2 20* 25 30  GLUCOSE 134* 94 103*  BUN 11 10 20   CREATININE 0.60 0.48 0.58  CALCIUM 8.5* 8.6* 9.2   GFR: Estimated Creatinine Clearance: 32.2 mL/min (by C-G formula based on SCr of 0.8 mg/dL). Liver Function Tests: No results for input(s): AST, ALT, ALKPHOS, BILITOT, PROT, ALBUMIN in the last 168 hours. No results for input(s): LIPASE, AMYLASE in the last 168 hours. No results for input(s): AMMONIA in the last 168 hours. Coagulation Profile: No results for input(s): INR, PROTIME in the last 168 hours. Cardiac Enzymes:  Recent Labs Lab 11/25/15 1435 11/25/15 2350 11/26/15 0536  TROPONINI <0.03 <0.03 <0.03   BNP (last 3 results) No results for input(s): PROBNP in the last 8760 hours. HbA1C: No results for input(s): HGBA1C in the last 72 hours. CBG: No results for input(s): GLUCAP in the last 168 hours. Lipid Profile: No results for input(s): CHOL, HDL, LDLCALC, TRIG, CHOLHDL, LDLDIRECT in the last 72 hours. Thyroid Function Tests:  Recent Labs  11/25/15 1848  TSH 2.531   Anemia Panel: No results for input(s): VITAMINB12, FOLATE, FERRITIN, TIBC, IRON, RETICCTPCT in the last 72 hours. Sepsis Labs: No results for input(s): PROCALCITON, LATICACIDVEN in the last 168 hours.  No results found for this or  any previous visit (from the past 240 hour(s)).       Radiology Studies: No results found.      Scheduled Meds: . amLODipine  5 mg Oral Daily  . aspirin EC  81 mg Oral Daily  . carvedilol  3.125 mg Oral BID WC  . feeding supplement (ENSURE ENLIVE)  237 mL Oral BID BM  . fenofibrate  160 mg Oral Daily  . furosemide  20 mg Intravenous Q12H  . heparin  5,000 Units Subcutaneous Q8H  . lansoprazole  30 mg Oral BID AC  . levothyroxine  50 mcg Oral QAC breakfast  . [START ON 11/29/2015] losartan  25 mg Oral Daily  . montelukast  10 mg Oral QHS  .  multivitamin with minerals  1 tablet Oral Daily  . senna  1 tablet Oral BID  . sodium chloride flush  3 mL Intravenous Q12H  . sodium chloride flush  3 mL Intravenous Q12H  . timolol  1 drop Both Eyes BID   Continuous Infusions:    LOS: 3 days    Time spent: 30 minutes    Newman Pies, MD [facility] Pager [phone]  If 7PM-7AM, please contact night-coverage [email or web address] Password [email or web address] 11/28/2015, 2:03 PM

## 2015-11-29 ENCOUNTER — Inpatient Hospital Stay
Admission: RE | Admit: 2015-11-29 | Discharge: 2015-12-19 | Disposition: A | Discharge status: Home / Self Care | Payer: Medicare Other | Source: Ambulatory Visit | Attending: Internal Medicine | Admitting: Internal Medicine

## 2015-11-29 ENCOUNTER — Non-Acute Institutional Stay (SKILLED_NURSING_FACILITY): Payer: Medicare Other | Admitting: Internal Medicine

## 2015-11-29 DIAGNOSIS — I5022 Chronic systolic (congestive) heart failure: Secondary | ICD-10-CM | POA: Diagnosis not present

## 2015-11-29 DIAGNOSIS — E440 Moderate protein-calorie malnutrition: Secondary | ICD-10-CM | POA: Diagnosis not present

## 2015-11-29 DIAGNOSIS — I447 Left bundle-branch block, unspecified: Secondary | ICD-10-CM | POA: Diagnosis not present

## 2015-11-29 DIAGNOSIS — I5023 Acute on chronic systolic (congestive) heart failure: Secondary | ICD-10-CM | POA: Diagnosis not present

## 2015-11-29 DIAGNOSIS — H7292 Unspecified perforation of tympanic membrane, left ear: Secondary | ICD-10-CM | POA: Diagnosis not present

## 2015-11-29 DIAGNOSIS — E785 Hyperlipidemia, unspecified: Secondary | ICD-10-CM

## 2015-11-29 DIAGNOSIS — I428 Other cardiomyopathies: Secondary | ICD-10-CM | POA: Diagnosis not present

## 2015-11-29 DIAGNOSIS — R110 Nausea: Secondary | ICD-10-CM | POA: Diagnosis not present

## 2015-11-29 DIAGNOSIS — K219 Gastro-esophageal reflux disease without esophagitis: Secondary | ICD-10-CM | POA: Diagnosis not present

## 2015-11-29 DIAGNOSIS — I10 Essential (primary) hypertension: Secondary | ICD-10-CM

## 2015-11-29 DIAGNOSIS — I429 Cardiomyopathy, unspecified: Secondary | ICD-10-CM | POA: Diagnosis not present

## 2015-11-29 DIAGNOSIS — E039 Hypothyroidism, unspecified: Secondary | ICD-10-CM | POA: Diagnosis not present

## 2015-11-29 DIAGNOSIS — Z7189 Other specified counseling: Secondary | ICD-10-CM | POA: Diagnosis not present

## 2015-11-29 DIAGNOSIS — M6281 Muscle weakness (generalized): Secondary | ICD-10-CM | POA: Diagnosis not present

## 2015-11-29 DIAGNOSIS — F329 Major depressive disorder, single episode, unspecified: Secondary | ICD-10-CM | POA: Diagnosis not present

## 2015-11-29 DIAGNOSIS — T161XXS Foreign body in right ear, sequela: Secondary | ICD-10-CM | POA: Diagnosis not present

## 2015-11-29 DIAGNOSIS — H6121 Impacted cerumen, right ear: Secondary | ICD-10-CM | POA: Diagnosis not present

## 2015-11-29 MED ORDER — LOSARTAN POTASSIUM 25 MG PO TABS: 25.0000 mg | ORAL_TABLET | Freq: Every day | ORAL | 1 refills | Status: DC

## 2015-11-29 NOTE — Progress Notes (Signed)
This is an acute visit.  Level care skilled.  Facility [facility].  Chief complaint-acute visit follow-up hospitalization for acute on chronic systolic congestive heart failure.  History of present illness.  Patient is a very pleasant 82 year old female with a history of systolic dysfunction with an ejection fraction in the 25% range as well as hypertension and hypothyroidism-she presented to hospital with increasing shortness of breath and dyspnea on exertion.  She was admitted for systolic CHF.  It appears she was aggressively diuresed and she did respond to this.  She was discharged in improved condition.  She is here for rehabilitation and strengthening.  She had been living by herself but does not wish to return to that-she does have a Actuary.  She continues on Lasix as well as losartan.  There was an extensive discussion with patient and her healthcare power of attorney in the hospital about goals of care-patient did express desire to die naturally is not 1 aggressive further interventions.  She did not want a cardiac evaluation consultation for apparently a pulse rate in the 30s.  It appears she will be followed by palliative care here as well.  She does have a history of hypothyroidism is on Synthroid we will update a TSH.  She also had hyponatremia which improved in the hospital.  She had been on fenofibrate prior to her hospital admission this was discontinued since patient wanted to minimize her medications.  Past Medical History:  Diagnosis Date  . Aortic stenosis   . Hyperlipidemia   . Hypertension   . Hypothyroidism   . LBBB (left bundle branch block)   . Myocardial infarct ([place])    [id number]  . Nonischemic cardiomyopathy [facility])            Past Surgical History:  Procedure Laterality Date  . ABDOMINAL HYSTERECTOMY    . CHOLECYSTECTOMY    . EYE SURGERY     Social History  Substance Use Topics  . Smoking status: Never Smoker    . Smokeless tobacco: Never Used  . Alcohol use No       Family History :           Family History  Problem Relation Age of Onset  . Heart attack Father   . Hypertension Brother   . Cancer      amLODipine 5 MG tablet Commonly known as:  NORVASC Take 1 tablet (5 mg total) by mouth daily.  aspirin EC 81 MG tablet Take 81 mg by mouth daily.  BETIMOL 0.5 % ophthalmic solution Generic drug:  timolol Place 1 drop into both eyes 2 (two) times daily.  fluticasone 50 MCG/ACT nasal spray Commonly known as:  FLONASE Place 1 spray into both nostrils 2 (two) times daily as needed for allergies.  furosemide 20 MG tablet Commonly known as:  LASIX Take 0.5 tablets (10 mg total) by mouth daily.  lansoprazole 30 MG capsule Commonly known as:  PREVACID TAKE 1 CAPSULE 2 TIMES DAILY BEFORE A MEAL AND OTHER MEDICATIONS.  levothyroxine 50 MCG tablet Commonly known as:  SYNTHROID, LEVOTHROID Take 50 mcg by mouth daily before breakfast.  losartan 25 MG tablet Commonly known as:  COZAAR Take 1 tablet (25 mg total) by mouth daily.  montelukast 10 MG tablet Commonly known as:  SINGULAIR Take 10 mg by mouth at bedtime.  potassium chloride 8 MEQ tablet Commonly known as:  KLOR-CON TAKE (1) TABLET BY MOUTH TWICE DAILY.     Review of systems.  In general does  not complaining any fever or chills.  Skin does not complain of rashes or itching.  Eyes does have prescription lenses reports a history of macular degeneration with visual deficits.  Head ears eyes nose mouth and throat again visual changes as noted above does not complain of any sore throat is hard of hearing.  Respiratory is not complaining of shortness or breath or cough currently.  Cardiac history of CHF but is not complaining of chest pain does not appear to have significant edema at this point.  GI is not complaining of nausea vomiting diarrhea constipation or abdominal discomfort.  GU does not complaining of  dysuria.  Muscle skeletal has weakness but is not complaining of any joint pain currently. Other than chronic neck pain  Neurologic is not complaining of dizziness headache or syncope.  Psych does not complain of depression or anxiety appears bright alert engaged.  Physical exam.  She is afebrile pulse of 80 respirations 19 blood pressure 157/71 O2 sats ration is 96% on room air.  General this is a frail elderly female in no distress resting comfortably in bed.  Her skin is warm and dry.  Eyes she has prescription lenses says she can make out that I'm wearing glasses and have a white shirt but does not see detail.  Oropharynx is clear mucous membranes moist.  Neck could not appreciate any deformity or  Acute  tenderness to palpable patient of back of neck she says this does feel sore at times  Chest is clear to auscultation there is no labored breathing.  Heart is regular rate and rhythm without murmur gallop or rub she does not have significant lower extremity edema pedal pulses are intact.  GU does not appear to have suprapubic tenderness.  Muscle skeletal does move all extremities 4 limited exam since she is in bed strength appears to be intact upper and lower extremities grip strength is strong bilaterally.  Neurologic is grossly intact her speech is clear no lateralizing findings.  Psych she is alert nor aged pleasant and appropriate quite spry.  Labs.  11/27/2015.  Sodium 137 potassium 3.9 BUN 20 creatinine 0.58.  11/26/2015.  WBC 4.0 hemoglobin 12.1 platelets 235.  Assessment and plan.  #1 history of systolic CHF again she did receive extensive diuresis has been discharged on low-dose Lasix 10 mg a day with potassium supplementation will monitor weights closely daily notify provider of gain greater than 3 pounds also will update a metabolic panel next laboratory day.  #2 history of hypothyroidism she is on Synthroid we will update a TSH.  #3 history  hyperlipidemia her fenofibrate has been discontinued secondary to patient's wishes to minimize her medications.  #4-history of hypokalemia in the hospital was down to 3.1 on September 4 this has been supplemented again will update this Laboratory day.  #5-history of neck discomfort she says this is chronic and has responded to [employer] will write an order for this  #6 history of hypertension she is on Cozaar systolic is somewhat elevated today Will monitor for now as we have minimal readings.  Again patient has expressed desire to have minimal medication adjustments   In regards goals of care again it appears she will be followed by palliative care here in the facility she does not want aggressive workup of her congestive heart failure or pulse rate that was in the 30s apparently during her hospitalization.  Clinically she appears to be stable however.  [id number] note greater than 35 minutes spent assessing patient-reviewing her  chart-reviewing her labs-discussing her status with family in the room-and coordinating and formulating a plan of care for numerous diagnoses-of note greater than 50% of time spent coordinating plan of care

## 2015-11-29 NOTE — Progress Notes (Signed)
Night RN stated that pt had a 2.35 second pause but asymptomatic. CCMD called and stated pt's HR dipped into the 30's last night. HR went into the 30's this morning but not Symptomatic. MD notified and stated that we will not have cardio involved. RN will continue to monitor Brittany Hillock, RN

## 2015-11-29 NOTE — Progress Notes (Signed)
Called report to Inez Catalina RN at [facility] regarding pt's return to facility. Pt information and education explained. Pt taken back to PCN by staff member. VSS.  IV catheter taken out. Oswald Hillock, RN

## 2015-11-29 NOTE — Clinical Social Work Placement (Signed)
   CLINICAL SOCIAL WORK PLACEMENT  NOTE  Date:  11/29/2015  Patient Details  Name: Brittany Wallace MRN: [id number] Date of Birth: 18-Dec-1920  Clinical Social Work is seeking post-discharge placement for this patient at the [place] level of care (*CSW will initial, date and re-position this form in  chart as items are completed):  Yes   Patient/family provided with [place] Work Department's list of facilities offering this level of care within the geographic area requested by the patient (or if unable, by the patient's family).  Yes   Patient/family informed of their freedom to choose among providers that offer the needed level of care, that participate in Medicare, Medicaid or managed care program needed by the patient, have an available bed and are willing to accept the patient.  Yes   Patient/family informed of [facility]'s ownership interest in [facility] and [facility], as well as of the fact that they are under no obligation to receive care at these facilities.  PASRR submitted to EDS on 11/26/15     PASRR number received on 11/26/15     Existing PASRR number confirmed on       FL2 transmitted to all facilities in geographic area requested by pt/family on       FL2 transmitted to all facilities within larger geographic area on       Patient informed that his/her managed care company has contracts with or will negotiate with certain facilities, including the following:            Patient/family informed of bed offers received.  Patient chooses bed at [facility]     Physician recommends and patient chooses bed at      Patient to be transferred to [facility] on 11/29/15.  Patient to be transferred to facility by [facility] staff     Patient family notified on 11/29/15 of transfer.  Name of family member notified:  Benn Moulder [phone]. CSW signing off. Clinicals sent via [employer].     PHYSICIAN        Additional Comment:    _______________________________________________ Ihor Gully, LCSW 11/29/2015, 12:04 PM

## 2015-11-29 NOTE — Discharge Summary (Signed)
Physician Discharge Summary  Brittany Wallace [id number] DOB: 12/26/1920 DOA: 11/25/2015  PCP: Rocky Morel, MD  Admit date: 11/25/2015 Discharge date: 11/29/2015  Admitted From: Home Disposition:  SNF  Recommendations for Outpatient Follow-up:  1. Follow up with Cardiologist at previously scheduled appointment next week  Home Health:No Equipment/Devices:None  Discharge Condition:Stable CODE STATUS:DNR Diet recommendation: Regular  Brief/Interim Summary: 82 y.o.female,With past medical history relevant for systolic dysfunction CHF with EF in the 25% range as well as hypertension and hypothyroidism who presents with increasing shortness of breath and dyspnea on exertion especially over the last couple of days.Patient was admitted for workup and management of acute heart failure.  Discharge Diagnoses:  Principal Problem:   Systolic CHF, acute on chronic (HCC) Active Problems:   HTN (hypertension)   Hyperlipidemia   Non-ischemic cardiomyopathy (HCC)   Acute exacerbation of CHF (congestive heart failure) ([place])   Palliative care encounter   Goals of care, counseling/discussion   DNR (do not resuscitate) discussion   Malnutrition of moderate degree  Acutely decompensated chronic systolic congestive heart failure - Recent EF of 25-30% on 05/28/2015 2-D echocardiogram - Weight on admission: 49.8 kg - Weight today: 46.5 kg - place on home dose of lasix - Continue on Losartan 25mg  daily   Hyperlipidemia - Patient had been on fenofibrate prior to admission - will discontinue this as patient is trying to limit medications at time of discharge  Nonischemic cardiomyopathy - diuresed well - place back on home dose of lasix  Hyponatremia - Improved.   Goals of Care, Counseling/ Discussion - Extensive discussion today with patient and her healthcare POA - Patient wishes to die naturally and does not want much in terms of further interventions - Denies wanting  cardiac evaluation/ consultation for heart rate in 30s - Palliative care consulted and appreciate their recommendations on goals of care - will fill out MOST form   Discharge Instructions  Discharge Instructions    Diet general    Complete by:  As directed   Increase activity slowly    Complete by:  As directed       Medication List    STOP taking these medications   atenolol 25 MG tablet Commonly known as:  TENORMIN   CITRACAL + D PO   Fenofibric Acid 135 MG Cpdr   lisinopril 5 MG tablet Commonly known as:  PRINIVIL,ZESTRIL   meclizine 25 MG tablet Commonly known as:  ANTIVERT   SALINE NASAL SPRAY NA     TAKE these medications   amLODipine 5 MG tablet Commonly known as:  NORVASC Take 1 tablet (5 mg total) by mouth daily.   aspirin EC 81 MG tablet Take 81 mg by mouth daily.   BETIMOL 0.5 % ophthalmic solution Generic drug:  timolol Place 1 drop into both eyes 2 (two) times daily.   fluticasone 50 MCG/ACT nasal spray Commonly known as:  FLONASE Place 1 spray into both nostrils 2 (two) times daily as needed for allergies.   furosemide 20 MG tablet Commonly known as:  LASIX Take 0.5 tablets (10 mg total) by mouth daily.   lansoprazole 30 MG capsule Commonly known as:  PREVACID TAKE 1 CAPSULE 2 TIMES DAILY BEFORE A MEAL AND OTHER MEDICATIONS.   levothyroxine 50 MCG tablet Commonly known as:  SYNTHROID, LEVOTHROID Take 50 mcg by mouth daily before breakfast.   losartan 25 MG tablet Commonly known as:  COZAAR Take 1 tablet (25 mg total) by mouth daily.   montelukast 10 MG  tablet Commonly known as:  SINGULAIR Take 10 mg by mouth at bedtime.   potassium chloride 8 MEQ tablet Commonly known as:  KLOR-CON TAKE (1) TABLET BY MOUTH TWICE DAILY.      Contact information for after-discharge care    [facility] .   Specialty:  Skilled Nursing Facility Contact information: [address]  [address] [phone]             Allergies  Allergen Reactions  . Aspirin     High doses.  Mack Hook [Levofloxacin In D5w]     Fuzzy headed  . Pantoprazole     Consultations:  None   Procedures/Studies: Dg Chest 1 View  Result Date: 11/25/2015 CLINICAL DATA:  Shortness of breath, hypertension, prior MI, non ischemic cardiomyopathy EXAM: CHEST 1 VIEW COMPARISON:  Portable exam 1411 hours compared to 07/07/2012 FINDINGS: Enlargement of cardiac silhouette with pulmonary vascular congestion. Atherosclerotic calcification aorta. Perihilar interstitial infiltrates likely representing pulmonary edema and CHF. Bibasilar effusions and atelectasis. No pneumothorax. Bones demineralized. IMPRESSION: Probable CHF with bibasilar effusions and atelectasis. Electronically Signed   By: Lavonia Dana M.D.   On: 11/25/2015 14:22        Subjective: Patient is sitting in bed and talking to her caregiver, Harriett.  Overnight patient had a 2 second pause in her heart rate and she also was noted to have a heart rate in the 30s.  This morning patient was again noted to have a heart rate in the 30s and then upon evaluation heart rate was in the 80s and was normal sinus rhythm.  Discussed with patient at length whether or not she would want evaluation by cardiology.  Patient feels strongly that if it is her time to die she is ready for a natural death.  She repeatedly mentioned she does not want a pacemaker, she does not want an extreme measures or any invasive measures. She is tired of taking so much medication.  She is ready to be discharge today.  Discharge Exam: Vitals:   11/29/15 0451 11/29/15 0859  BP: (!) 159/70 (!) 157/60  Pulse: 71 74  Resp: 14 16  Temp: 97.5 F (36.4 C)    Vitals:   11/28/15 1936 11/28/15 2042 11/29/15 0451 11/29/15 0859  BP:  (!) 176/58 (!) 159/70 (!) 157/60  Pulse:  71 71 74  Resp:  20 14 16   Temp:  97.5 F (36.4 C) 97.5 F (36.4 C)   TempSrc:  Oral Oral   SpO2:  96% 100% 100% 100%  Weight:   46.5 kg (102 lb 9.6 oz)   Height:        General: Pt is alert, awake, not in acute distress, patient is hard of hearing Cardiovascular: RRR, S1/S2 +, no rubs, no gallops Respiratory: CTA bilaterally, no wheezing, no rhonchi Abdominal: Soft, NT, ND, bowel sounds + Extremities: no edema, no cyanosis, poor skin turgor    The results of significant diagnostics from this hospitalization (including imaging, microbiology, ancillary and laboratory) are listed below for reference.     Microbiology: No results found for this or any previous visit (from the past 240 hour(s)).   Labs: BNP (last 3 results)  Recent Labs  05/18/15 1303 08/26/15 1348 11/25/15 1435  BNP 874.0* 573.1* 123456*   Basic Metabolic Panel:  Recent Labs Lab 11/25/15 1435 11/26/15 0536 11/27/15 0608  NA 130* 133* 137  K 3.1* 3.5 3.9  CL 98* 101 100*  CO2 20* 25 30  GLUCOSE 134* 94 103*  BUN 11 10 20   CREATININE 0.60 0.48 0.58  CALCIUM 8.5* 8.6* 9.2   Liver Function Tests: No results for input(s): AST, ALT, ALKPHOS, BILITOT, PROT, ALBUMIN in the last 168 hours. No results for input(s): LIPASE, AMYLASE in the last 168 hours. No results for input(s): AMMONIA in the last 168 hours. CBC:  Recent Labs Lab 11/25/15 1435 11/26/15 0536  WBC 6.7 4.0  HGB 12.4 12.1  HCT 36.6 35.8*  MCV 92.0 92.7  PLT 309 235   Cardiac Enzymes:  Recent Labs Lab 11/25/15 1435 11/25/15 2350 11/26/15 0536  TROPONINI <0.03 <0.03 <0.03   BNP: Invalid input(s): POCBNP CBG: No results for input(s): GLUCAP in the last 168 hours. D-Dimer No results for input(s): DDIMER in the last 72 hours. Hgb A1c No results for input(s): HGBA1C in the last 72 hours. Lipid Profile No results for input(s): CHOL, HDL, LDLCALC, TRIG, CHOLHDL, LDLDIRECT in the last 72 hours. Thyroid function studies No results for input(s): TSH, T4TOTAL, T3FREE, THYROIDAB in the last 72 hours.  Invalid input(s):  FREET3 Anemia work up No results for input(s): VITAMINB12, FOLATE, FERRITIN, TIBC, IRON, RETICCTPCT in the last 72 hours. Urinalysis    Component Value Date/Time   COLORURINE YELLOW 05/18/2015 [place] 05/18/2015 1352   LABSPEC 1.010 05/18/2015 1352   PHURINE 7.5 05/18/2015 1352   GLUCOSEU NEGATIVE 05/18/2015 1352   HGBUR NEGATIVE 05/18/2015 1352   BILIRUBINUR NEGATIVE 05/18/2015 1352   KETONESUR NEGATIVE 05/18/2015 1352   PROTEINUR NEGATIVE 05/18/2015 1352   UROBILINOGEN 0.2 07/07/2012 2040   NITRITE NEGATIVE 05/18/2015 1352   LEUKOCYTESUR NEGATIVE 05/18/2015 1352   Sepsis Labs Invalid input(s): PROCALCITONIN,  WBC,  LACTICIDVEN Microbiology No results found for this or any previous visit (from the past 240 hour(s)).   Time coordinating discharge: Over 30 minutes  SIGNED:   Newman Pies, MD  [facility] 11/29/2015, 10:09 AM Pager [phone] If 7PM-7AM, please contact night-coverage [email or web address] Password [email or web address]

## 2015-12-02 ENCOUNTER — Non-Acute Institutional Stay (SKILLED_NURSING_FACILITY): Payer: Medicare Other | Admitting: Internal Medicine

## 2015-12-02 ENCOUNTER — Other Ambulatory Visit (HOSPITAL_COMMUNITY)
Admission: RE | Admit: 2015-12-02 | Discharge: 2015-12-02 | Disposition: A | Discharge status: Home / Self Care | Payer: Medicare Other | Source: Skilled Nursing Facility | Attending: Internal Medicine | Admitting: Internal Medicine

## 2015-12-02 ENCOUNTER — Encounter: Payer: Self-pay | Admitting: Internal Medicine

## 2015-12-02 DIAGNOSIS — E039 Hypothyroidism, unspecified: Secondary | ICD-10-CM | POA: Insufficient documentation

## 2015-12-02 DIAGNOSIS — I447 Left bundle-branch block, unspecified: Secondary | ICD-10-CM

## 2015-12-02 DIAGNOSIS — I5023 Acute on chronic systolic (congestive) heart failure: Secondary | ICD-10-CM

## 2015-12-02 DIAGNOSIS — I10 Essential (primary) hypertension: Secondary | ICD-10-CM | POA: Diagnosis not present

## 2015-12-02 DIAGNOSIS — I429 Cardiomyopathy, unspecified: Secondary | ICD-10-CM

## 2015-12-02 DIAGNOSIS — I428 Other cardiomyopathies: Secondary | ICD-10-CM

## 2015-12-02 LAB — CBC WITH DIFFERENTIAL/PLATELET
BASOS PCT: 1 %
Basophils Absolute: 0.1 10*3/uL (ref 0.0–0.1)
EOS ABS: 0.4 10*3/uL (ref 0.0–0.7)
EOS PCT: 9 %
HCT: 38.7 % (ref 36.0–46.0)
Hemoglobin: 12.7 g/dL (ref 12.0–15.0)
LYMPHS ABS: 1.2 10*3/uL (ref 0.7–4.0)
Lymphocytes Relative: 28 %
MCH: 31 pg (ref 26.0–34.0)
MCHC: 32.8 g/dL (ref 30.0–36.0)
MCV: 94.4 fL (ref 78.0–100.0)
MONO ABS: 0.6 10*3/uL (ref 0.1–1.0)
MONOS PCT: 13 %
Neutro Abs: 2.1 10*3/uL (ref 1.7–7.7)
Neutrophils Relative %: 49 %
Platelets: 285 10*3/uL (ref 150–400)
RBC: 4.1 MIL/uL (ref 3.87–5.11)
RDW: 14.2 % (ref 11.5–15.5)
WBC: 4.4 10*3/uL (ref 4.0–10.5)

## 2015-12-02 LAB — BASIC METABOLIC PANEL
Anion gap: 7 (ref 5–15)
BUN: 19 mg/dL (ref 6–20)
CALCIUM: 8.9 mg/dL (ref 8.9–10.3)
CO2: 26 mmol/L (ref 22–32)
CREATININE: 0.52 mg/dL (ref 0.44–1.00)
Chloride: 100 mmol/L — ABNORMAL LOW (ref 101–111)
GLUCOSE: 95 mg/dL (ref 65–99)
Potassium: 3.9 mmol/L (ref 3.5–5.1)
SODIUM: 133 mmol/L — AB (ref 135–145)

## 2015-12-02 NOTE — Progress Notes (Signed)
Provider: Veleta Miners  Location:   [facility] Room Number: 108/W Place of Service:  SNF (31)  PCP: Rocky Morel, MD Patient Care Team: Caren Macadam, MD as PCP - General (Family Medicine)  Extended Emergency Contact Information Primary Emergency Contact: Garth Bigness, [place] [address] Johnnette Litter of [place] Phone: [phone] Mobile Phone: [phone] Relation: Other Secondary Emergency Contact: Yanellie, [place], [place] [address] Johnnette Litter of [place] Phone: [phone] Mobile Phone: [phone] Relation: Nephew  Code Status: DNR Goals of Care: Advanced Directive information Advanced Directives 12/02/2015  Does patient have an advance directive? Yes  Type of Paramedic of [place];Out of facility DNR (pink MOST or yellow form)  Does patient want to make changes to advanced directive? No - Patient declined  Copy of advanced directive(s) in chart? Yes  Would patient like information on creating an advanced directive? -      Chief Complaint  Patient presents with  . New Admit To SNF    HPI: Patient is a 82 y.o. female seen today for admission to SNF for rehab. Patient Was admitted to the hospital with Systolic CHF. Her lasrt EF in Echo 03/17 showed EF of 25-30 %. Patient was diuresed well. She is feeling better now. Only C/O some fuzzy feeling in her head which has been her Chronic problem. She is not C/O any Chest pain, No SOB, some congestion with cough. She is also having some constipation. But no abdominal pain. Appetite is good.  Past Medical History:  Diagnosis Date  . Aortic stenosis   . Hyperlipidemia   . Hypertension   . Hypothyroidism   . LBBB (left bundle branch block)   . Myocardial infarct ([place])    [id number]  . Nonischemic cardiomyopathy [facility])    Past Surgical History:  Procedure Laterality Date  . ABDOMINAL HYSTERECTOMY    . CHOLECYSTECTOMY    .  EYE SURGERY      reports that she has never smoked. She has never used smokeless tobacco. She reports that she does not drink alcohol or use drugs. Social History   Social History  . Marital status: Single    Spouse name: N/A  . Number of children: N/A  . Years of education: N/A   Occupational History  . Not on file.   Social History Main Topics  . Smoking status: Never Smoker  . Smokeless tobacco: Never Used  . Alcohol use No  . Drug use: No  . Sexual activity: No   Other Topics Concern  . Not on file   Social History Narrative  . No narrative on file    Functional Status Survey:    Family History  Problem Relation Age of Onset  . Heart attack Father   . Hypertension Brother   . Cancer Sister       Allergies  Allergen Reactions  . Aspirin     High doses.  Mack Hook [Levofloxacin In D5w]     Fuzzy headed  . Pantoprazole       Medication List    Notice   This visit is during an admission. Changes to the med list made in this visit will be reflected in the After Visit Summary of the admission.     Review of Systems  Constitutional: Positive for appetite change and fatigue. Negative for chills, diaphoresis and fever.  HENT: Positive for congestion and  postnasal drip. Negative for rhinorrhea, sinus pressure, sneezing and sore throat.   Respiratory: Positive for cough. Negative for choking, chest tightness, shortness of breath and wheezing.   Cardiovascular: Negative for chest pain, palpitations and leg swelling.  Gastrointestinal: Positive for constipation. Negative for abdominal distention, abdominal pain, anal bleeding, blood in stool, diarrhea and nausea.    Vitals:   12/02/15 1533  BP: (!) 154/72  Pulse: 78  Resp: (!) 24  Temp: 97.9 F (36.6 C)  TempSrc: Oral  SpO2: 95%   There is no height or weight on file to calculate BMI. Physical Exam  Constitutional: She is oriented to person, place, and time. She appears well-developed.  HENT:    Head: Normocephalic.  Bad oral Hygiene.  Eyes: Pupils are equal, round, and reactive to light.  Cardiovascular: Normal rate, regular rhythm and normal heart sounds.   Pulmonary/Chest: Effort normal and breath sounds normal. No respiratory distress. She has no rales. She exhibits no tenderness.  Abdominal: Soft. Bowel sounds are normal. She exhibits no distension. There is no tenderness. There is no rebound and no guarding.  Musculoskeletal: She exhibits no edema.  Neurological: She is alert and oriented to person, place, and time.  Skin: Skin is warm and dry.    Labs reviewed: Basic Metabolic Panel:  Recent Labs  11/26/15 0536 11/27/15 0608 12/02/15 0614  NA 133* 137 133*  K 3.5 3.9 3.9  CL 101 100* 100*  CO2 25 30 26   GLUCOSE 94 103* 95  BUN 10 20 19   CREATININE 0.48 0.58 0.52  CALCIUM 8.6* 9.2 8.9   Liver Function Tests:  Recent Labs  05/18/15 1303 08/26/15 1348  AST 22 15  ALT 13* 7  ALKPHOS 42 54  BILITOT 0.7 0.4  PROT 6.8 6.8  ALBUMIN 3.8 4.0     CBC:  Recent Labs  05/18/15 1303 11/25/15 1435 11/26/15 0536 12/02/15 0614  WBC 6.3 6.7 4.0 4.4  NEUTROABS 4.5  --   --  2.1  HGB 13.6 12.4 12.1 12.7  HCT 39.4 36.6 35.8* 38.7  MCV 90.6 92.0 92.7 94.4  PLT 265 309 235 285   Cardiac Enzymes:  Recent Labs  11/25/15 1435 11/25/15 2350 11/26/15 0536  TROPONINI <0.03 <0.03 <0.03   BNP:   Lab Results  Component Value Date   TSH 2.531 11/25/2015       Imaging and Procedures obtained prior to SNF admission: No results found.  Assessment/Plan  Acute on chronic systolic congestive heart failure [facility])  Patient is doing very well. I talked to her for long time and it seems she is aware of her worsening Cardiac status. She do not want any aggressive treatment. She doe want to follow with Dr Claiborne Billings.  She also at this time reluctant to go home by herself as she does not have anybody at home. Will Continue Lasix . Will follow clinically. Patient  also will continue Losartan. Non-ischemic cardiomyopathy (HCC) Continue Aspirin and Losartan  Hypertension Her BP is little high today. Will follow closely right now. She is on Amlodipine and cozaar.  Hypothyroidism, unspecified hypothyroidism type  Last TSH in 09/17 was normal . Continue same dose of synthroid.   Constipation Restart her Colace and continue Milk of magnesia.     Family/ staff Communication:   Labs/tests ordered:

## 2015-12-03 DIAGNOSIS — H7292 Unspecified perforation of tympanic membrane, left ear: Secondary | ICD-10-CM | POA: Diagnosis not present

## 2015-12-03 DIAGNOSIS — T161XXS Foreign body in right ear, sequela: Secondary | ICD-10-CM | POA: Diagnosis not present

## 2015-12-03 DIAGNOSIS — H6121 Impacted cerumen, right ear: Secondary | ICD-10-CM | POA: Diagnosis not present

## 2015-12-04 ENCOUNTER — Ambulatory Visit (INDEPENDENT_AMBULATORY_CARE_PROVIDER_SITE_OTHER): Payer: Medicare Other | Admitting: Cardiovascular Disease

## 2015-12-04 ENCOUNTER — Encounter: Payer: Self-pay | Admitting: Cardiovascular Disease

## 2015-12-04 VITALS — BP 122/58 | HR 74 | Ht 64.0 in | Wt 104.1 lb

## 2015-12-04 DIAGNOSIS — E785 Hyperlipidemia, unspecified: Secondary | ICD-10-CM

## 2015-12-04 DIAGNOSIS — I447 Left bundle-branch block, unspecified: Secondary | ICD-10-CM

## 2015-12-04 DIAGNOSIS — I428 Other cardiomyopathies: Secondary | ICD-10-CM

## 2015-12-04 DIAGNOSIS — I10 Essential (primary) hypertension: Secondary | ICD-10-CM

## 2015-12-04 DIAGNOSIS — I429 Cardiomyopathy, unspecified: Secondary | ICD-10-CM

## 2015-12-04 MED ORDER — CARVEDILOL 3.125 MG PO TABS: 3.1250 mg | ORAL_TABLET | Freq: Two times a day (BID) | ORAL | 6 refills | Status: DC

## 2015-12-04 NOTE — Patient Instructions (Addendum)
Start Carvedilol 3,125 mg twice a day   Continue all other medications   Your physician wants you to follow-up in: 6 months. You will receive a reminder letter in the mail two months in advance. If you don't receive a letter, please call our office to schedule the follow-up appointment.

## 2015-12-10 NOTE — Progress Notes (Signed)
Patient ID: Brittany Wallace, female   DOB: 09-Jul-1920, 82 y.o.   MRN: [id number]      Primary M.D.: Dr. Benjie Karvonen  HPI: Brittany Wallace is a 82 y.o. female who presents to the office for a 3 month cardiology evaluation.  Brittany Wallace  has a remote history of a nonischemic cardiomyopathy and in 2001 her ejection fraction was found to be 30%. This subsequently improved and on 07/20/2012 echo Doppler study ejection fraction was 40-45%. She had grade 1 diastolic dysfunction,mild/moderate pulmonary hypertension with a PA pressure of 48 mm, moderate tricuspid regurgitation, moderate mitral regurgitation with mitral annular calcification. She also has a history of left bundle branch block, hypertension, as well as hyperlipidemia.  When I last saw her one year ago, she was continuing to do well.  She denies any significant chest pain or palpitations.  She does have GERD and recently her Prevacid was changed to just 1 capsule daily by Dr. Claiborne Rigg.  In the past.  She did develop increased peripheral edema on amlodipine when her dose was at 10 mg, which did improve with dose reduction to 5 mg.  Last year she sustained a right heel fracture.  She has difficulty hearing and uses a hearing aid.  She has a history of hypothyroidism.  She has been on fenofibrate therapy.  She is unaware of PND orthopnea.    Over the past year, she had some issues with dehydration.  Brittany Wallace denies chest pain or any major episodes of shortness of breath.  She presented to [facility] emergency room on 05/18/2015 complaining of being off balance and dizziness.  Her BNP was elevated at 874 and there was a murmur noted on physical exam.  She was seen in follow-up of her emergency room evaluation by Brittany Wallace.  A follow-up echo Doppler study showed worsening of LV function with an EF of 25-30% with wall motion abnormality involving the anteroseptal myocardium.  At that time she was told to stop HCTZ.  Since she also apparently was on Lasix.  Her  blood pressure was elevated and amlodipine 5 mg was instituted.  She denies any further episodes of dizziness.    As I last saw her, she was hospitalized from 11/25/2015 through 11/29/2015.  She presented with increasing shortness of breath several days of increased dyspnea on exertion.  During her hospitalization she was told to stop taking atenolol, fenofibrate , lisinopril, and meclizine.  She has been in a nursing home since discharge.  Advanced care directives were completed.  She is a DO NOT RESUSCITATE.  Most recently she is on amlodipine at 5 mg, aspirin 81 mg, Lasix 10 mg, levothyroxine 50 g, losartan 25 mg, in addition to Singulair.  She denies chest pain.  Brittany Wallace denies palpitations.  Past Medical History:  Diagnosis Date  . Aortic stenosis   . Hyperlipidemia   . Hypertension   . Hypothyroidism   . LBBB (left bundle branch block)   . Myocardial infarct ([place])    [id number]  . Nonischemic cardiomyopathy [facility])     Past Surgical History:  Procedure Laterality Date  . ABDOMINAL HYSTERECTOMY    . CHOLECYSTECTOMY    . EYE SURGERY      Allergies  Allergen Reactions  . Aspirin     High doses.  Mack Hook [Levofloxacin In D5w]     Fuzzy headed  . Pantoprazole     Current Outpatient Prescriptions  Medication Sig Dispense Refill  . carvedilol (COREG) 3.125 MG tablet Take 1 tablet (  3.125 mg total) by mouth 2 (two) times daily. 60 tablet 6   No current facility-administered medications for this visit.     Social History   Social History  . Marital status: Single    Spouse name: N/A  . Number of children: N/A  . Years of education: N/A   Occupational History  . Not on file.   Social History Main Topics  . Smoking status: Never Smoker  . Smokeless tobacco: Never Used  . Alcohol use No  . Drug use: No  . Sexual activity: No   Other Topics Concern  . Not on file   Social History Narrative  . No narrative on file      Socially she is a single has no children. No  tobacco alcohol use  Family History  Problem Relation Age of Onset  . Heart attack Father   . Hypertension Brother   . Cancer Sister     ROS General: Negative; No fevers, chills, or night sweats;  HEENT: Positive for decreased hearing, she uses a hearing aid. no sinus congestion, difficulty swallowing Pulmonary: Negative; No cough, wheezing, shortness of breath, hemoptysis Cardiovascular: See history of present illness  ankle swelling has improved GI: Negative; No nausea, vomiting, diarrhea, or abdominal pain GU: Negative; No dysuria, hematuria, or difficulty voiding Musculoskeletal: Negative; no myalgias, joint pain, or weakness Hematologic/Oncology: Negative; no easy bruising, bleeding Endocrine: Negative; no heat/cold intolerance Neuro: Mild vertigo; no , headaches Skin: Negative; No rashes or skin lesions Psychiatric: Negative; No behavioral problems, depression Sleep: Negative; No snoring, daytime sleepiness, hypersomnolence, bruxism, restless legs, hypnogognic hallucinations, no cataplexy  Other comprehensive 14 point system review is negative.  PE BP (!) 122/58 (BP Location: Right Arm, Patient Position: Sitting, Cuff Size: Small)   Pulse 74   Ht _0  (1.626 m)   Wt 104 lb 2 oz (47.2 kg)   BMI 17.87 kg/m   Repeat blood pressure was 130/70  Wt Readings from Last 3 Encounters:  12/04/15 104 lb 2 oz (47.2 kg)  11/29/15 102 lb 9.6 oz (46.5 kg)  09/24/15 112 lb (50.8 kg)   General: Alert, oriented, no distress.  Appears younger than stated age Skin: normal turgor, no rashes HEENT: Normocephalic, atraumatic. Pupils round and reactive; sclera anicteric;no lid lag.  Nose without nasal septal hypertrophy Mouth/Parynx benign; Mallinpatti scale 2 Neck: No JVD, no carotid bruis , with normal carotid upstroke Chest wall: Nontender to palpation Lungs: clear to ausculatation and percussion; no wheezing or rales Heart: RRR, s1 s2 normal 2/6 murmur in the aortic region. Split  S2.  No S3 or S4 gallop.  No diastolic murmur, rubs, thrills or heaves. Abdomen: soft, nontender; no hepatosplenomehaly, BS+; abdominal aorta nontender and not dilated by palpation. Back: No CVA Pulses 2+ Extremities: Resolution of prior ankle edema;no clubbing cyanosis, Homan's sign negative  Neurologic: grossly nonfocal Psychological: Normal affect and mood  ECG (independently read by me): Sinus rhythm at 75 bpm.  Left bundle branch block with repolarization changes.  A 186 ms.  QTc interval 489 ms.  June 2017 ECG (independently read by me): Normal sinus rhythm with PACs.  Heart rate 63 bpm.  Mild first-degree AV block with a PR interval at 202 ms.  LVH with repolarization changes.  April 2016 ECG (independently read by me): Normal sinus rhythm at 68 bpm.  Left bundle branch block with repolarization changes.  April 2015 ECG (independently read by me): Sinus bradycardia 54 beats per minute.  Left bundle branch block with  repolarization changes.  Prior 12/28/2012 ECG: Sinus rhythm with left axis deviation and evidence for left bundle branch block. Rate 70 beats per minute.  LABS:   BMP Latest Ref Rng & Units 12/02/2015 11/27/2015 11/26/2015  Glucose 65 - 99 mg/dL 95 103(H) 94  BUN 6 - 20 mg/dL _0 Creatinine 0.44 - 1.00 mg/dL 0.52 0.58 0.48  BUN/Creat Ratio 12 - 28 - - -  Sodium 135 - 145 mmol/L 133(L) 137 133(L)  Potassium 3.5 - 5.1 mmol/L 3.9 3.9 3.5  Chloride 101 - 111 mmol/L 100(L) 100(L) 101  CO2 22 - 32 mmol/L _1 Calcium 8.9 - 10.3 mg/dL 8.9 9.2 8.6(L)    Hepatic Function Latest Ref Rng & Units 08/26/2015 05/18/2015 12/14/2013  Total Protein 6.0 - 8.5 g/dL 6.8 6.8 7.0  Albumin 3.2 - 4.6 g/dL 4.0 3.8 4.1  AST 0 - 40 IU/L _2 ALT 0 - 32 IU/L 7 13(L) 13  Alk Phosphatase 39 - 117 IU/L 54 42 34(L)  Total Bilirubin 0.0 - 1.2 mg/dL 0.4 0.7 0.8     CBC Latest Ref Rng & Units 12/02/2015 11/26/2015 11/25/2015  WBC 4.0 - 10.5 K/uL 4.4 4.0 6.7  Hemoglobin 12.0 - 15.0 g/dL  12.7 12.1 12.4  Hematocrit 36.0 - 46.0 % 38.7 35.8(L) 36.6  Platelets 150 - 400 K/uL 285 235 309   Lab Results  Component Value Date   MCV 94.4 12/02/2015   MCV 92.7 11/26/2015   MCV 92.0 11/25/2015   Lab Results  Component Value Date   TSH 2.531 11/25/2015   BNP No results found for: PROBNP  Lipid Panel     Component Value Date/Time   CHOL 140 07/24/2013 0933   TRIG 142 07/24/2013 0933   HDL 32 (L) 07/24/2013 0933   CHOLHDL 4.4 07/24/2013 0933   VLDL 28 07/24/2013 0933   LDLCALC 80 07/24/2013 0933     RADIOLOGY: No results found.    ASSESSMENT AND PLAN: Brittany Wallace is a 82 year old female who has a history of hypertension, GERD, hyperlipidemia, hypothyroidism and has a remote history of a cardiomyopathy.  Her initial LV function was 30% in 2001 and this had improved to approximate 45% on her echo Doppler study of April 2014.  She did have moderate pulmonary hypertension with a PA pressure estimated at 48 mm. her prior leg swelling improved with reducing her amlodipine dose. She was hospitalized in February 2017.  Her BNP was elevated consistent with CHF.  Her echo Doppler study on 05/28/2015 demonstrated a reversion of her EF back to 25-30%.  She had mild aortic insufficiency, moderate mitral regurgitation, and severe left atrial dilatation with mild RA dilatation.  There was mild pulmonary hypertension with an estimated PA pressure 44 mm.  and I saw her in June 2017.  I elected to add low-dose lisinopril at 2.5 mg.  She was recently hospitalized in early September and is no longer taking lisinopril but is taking losartan 25 mg daily.  I have recommended institution of carvedilol at 3.125 mg twice a day in light of her LV dysfunction since she is no longer on her prior beta blocker regimen.  At present, her lungs are clear and she does not have significant edema.  She again expressed her DO NOT RESUSCITATE wish.  I will see her in 6 months for reevaluation.  Time spent: 25  minutes Brittany Sine, MD, [facility]  12/10/2015 7:39 PM

## 2015-12-12 ENCOUNTER — Non-Acute Institutional Stay (SKILLED_NURSING_FACILITY): Payer: Medicare Other | Admitting: Internal Medicine

## 2015-12-12 ENCOUNTER — Encounter: Payer: Self-pay | Admitting: Internal Medicine

## 2015-12-12 DIAGNOSIS — I429 Cardiomyopathy, unspecified: Secondary | ICD-10-CM | POA: Diagnosis not present

## 2015-12-12 DIAGNOSIS — F329 Major depressive disorder, single episode, unspecified: Secondary | ICD-10-CM | POA: Diagnosis not present

## 2015-12-12 DIAGNOSIS — I5023 Acute on chronic systolic (congestive) heart failure: Secondary | ICD-10-CM

## 2015-12-12 DIAGNOSIS — I10 Essential (primary) hypertension: Secondary | ICD-10-CM | POA: Diagnosis not present

## 2015-12-12 DIAGNOSIS — F32A Depression, unspecified: Secondary | ICD-10-CM

## 2015-12-12 DIAGNOSIS — I428 Other cardiomyopathies: Secondary | ICD-10-CM

## 2015-12-12 NOTE — Progress Notes (Signed)
Location:   Artist of Service:  SNF [phone]) Provider:  Gertie Gowda, MD  Patient Care Team: Sharilyn Sites, MD as PCP - General [facility])  Extended Emergency Contact Information Primary Emergency Contact: [place], [place] [address] Johnnette Litter of [place] Phone: [phone] Mobile Phone: [phone] Relation: Other Secondary Emergency Contact: [place], [place],  [address] [address]net[address] Litter of [place] Phone: [phone] Mobile Phone: [phone] Relation: Nephew  Code Status:  DNR Goals of care: Advanced Directive information Advanced Directives 12/12/2015  Does patient have an advance directive? Yes  Type of Paramedic of [place];Out of facility DNR (pink MOST or yellow form)  Does patient want to make changes to advanced directive? No - Patient declined  Copy of advanced directive(s) in chart? Yes  Would patient like information on creating an advanced directive? -     Chief Complaint  Patient presents with  . Acute Visit    Depression    HPI:  Pt is a 82 y.o. female seen today for an acute visit for  Depression. Per Social worker she has told few people and her that she doesn't want to live any more. I talked to patient in details again. She is very alert and said that she is not depressed. She just feels like with her weak heart and now that she is old she doesn't want her life prolonged. She says she doesn;t have much to live forward to. She does  Feel it is hard on her as she cannot hear or see that well any more. Also she feels bad that she cannot do things that she is use to do and now she has to depend on other people. She is eating well and sleeping well. Has had no suicidal ideation. She also Saw Dr Harl Bowie for CHF. He started her on Coreg BID for  LV dysfunction.  She is doing well with no SOB or Chest pain.   Past Medical History:  Diagnosis Date    . Aortic stenosis   . Hyperlipidemia   . Hypertension   . Hypothyroidism   . LBBB (left bundle branch block)   . Myocardial infarct ([place])    [id number]  . Nonischemic cardiomyopathy [facility])    Past Surgical History:  Procedure Laterality Date  . ABDOMINAL HYSTERECTOMY    . CHOLECYSTECTOMY    . EYE SURGERY      Allergies  Allergen Reactions  . Aspirin     High doses.  Mack Hook [Levofloxacin In D5w]     Fuzzy headed  . Pantoprazole     Current Outpatient Prescriptions on File Prior to Visit  Medication Sig Dispense Refill  . acetaminophen (TYLENOL) 325 MG tablet Take 650 mg by mouth every 4 (four) hours as needed.    Marland Kitchen amLODipine (NORVASC) 5 MG tablet Take 1 tablet (5 mg total) by mouth daily. 90 tablet 3  . aspirin EC 81 MG tablet Take 81 mg by mouth daily.    . carvedilol (COREG) 3.125 MG tablet Take 1 tablet (3.125 mg total) by mouth 2 (two) times daily. 60 tablet 6  . fluticasone (FLONASE) 50 MCG/ACT nasal spray Place 1 spray into both nostrils 2 (two) times daily.    . furosemide (LASIX) 20 MG tablet Take 0.5 tablets (10 mg total) by mouth daily. 45 tablet 3  . lansoprazole (PREVACID) 30 MG  capsule TAKE 1 CAPSULE 2 TIMES DAILY BEFORE A MEAL AND OTHER MEDICATIONS. 60 capsule 2  . levothyroxine (SYNTHROID, LEVOTHROID) 50 MCG tablet Take 50 mcg by mouth daily before breakfast.    . losartan (COZAAR) 25 MG tablet Take 1 tablet (25 mg total) by mouth daily. 30 tablet 1  . Magnesium Hydroxide (MILK OF MAGNESIA PO) Give 30 ml by mouth as needed for constipation, may try fleets enema    . montelukast (SINGULAIR) 10 MG tablet Take 10 mg by mouth at bedtime.    . timolol (BETIMOL) 0.5 % ophthalmic solution Place 1 drop into both eyes 2 (two) times daily.     No current facility-administered medications on file prior to visit.      Review of Systems  Constitutional: Negative for activity change, appetite change, chills, diaphoresis, fatigue and fever.  Respiratory: Positive for  stridor. Negative for apnea, cough, choking, chest tightness and shortness of breath.   Cardiovascular: Negative.   Gastrointestinal: Negative.      There is no immunization history on file for this patient. Pertinent  Health Maintenance Due  Topic Date Due  . DEXA SCAN  04/12/1987  . PNA vac Low Risk Adult (1 of 2 - PCV13) 04/12/1987  . INFLUENZA VACCINE  02/21/2016 (Originally 10/22/2015)   No flowsheet data found. Functional Status Survey:    Vitals:   12/12/15 1305  BP: (!) 150/62  Pulse: 63  Resp: 18  Temp: 98.1 F (36.7 C)  TempSrc: Oral   There is no height or weight on file to calculate BMI. Physical Exam  Constitutional: She appears well-developed.  HENT:  Head: Normocephalic.  Neck: Neck supple.  Cardiovascular: Normal rate, regular rhythm and normal heart sounds.   Pulmonary/Chest: Effort normal and breath sounds normal. No respiratory distress. She has no wheezes.  Abdominal: Soft. Bowel sounds are normal. She exhibits no distension. There is no tenderness. There is no rebound.  Patient does have some bruises on her abdomen due to Heparin injection in hospital    Labs reviewed:  Recent Labs  11/26/15 0536 11/27/15 0608 12/02/15 0614  NA 133* 137 133*  K 3.5 3.9 3.9  CL 101 100* 100*  CO2 25 30 26   GLUCOSE 94 103* 95  BUN 10 20 19   CREATININE 0.48 0.58 0.52  CALCIUM 8.6* 9.2 8.9    Recent Labs  05/18/15 1303 08/26/15 1348  AST 22 15  ALT 13* 7  ALKPHOS 42 54  BILITOT 0.7 0.4  PROT 6.8 6.8  ALBUMIN 3.8 4.0    Recent Labs  05/18/15 1303 11/25/15 1435 11/26/15 0536 12/02/15 0614  WBC 6.3 6.7 4.0 4.4  NEUTROABS 4.5  --   --  2.1  HGB 13.6 12.4 12.1 12.7  HCT 39.4 36.6 35.8* 38.7  MCV 90.6 92.0 92.7 94.4  PLT 265 309 235 285   Lab Results  Component Value Date   TSH 2.531 11/25/2015   No results found for: HGBA1C Lab Results  Component Value Date   CHOL 140 07/24/2013   HDL 32 (L) 07/24/2013   LDLCALC 80 07/24/2013   TRIG  142 07/24/2013   CHOLHDL 4.4 07/24/2013    Significant Diagnostic Results in last 30 days:  Dg Chest 1 View  Result Date: 11/25/2015 CLINICAL DATA:  Shortness of breath, hypertension, prior MI, non ischemic cardiomyopathy EXAM: CHEST 1 VIEW COMPARISON:  Portable exam 1411 hours compared to 07/07/2012 FINDINGS: Enlargement of cardiac silhouette with pulmonary vascular congestion. Atherosclerotic calcification aorta. Perihilar interstitial infiltrates  likely representing pulmonary edema and CHF. Bibasilar effusions and atelectasis. No pneumothorax. Bones demineralized. IMPRESSION: Probable CHF with bibasilar effusions and atelectasis. Electronically Signed   By: Lavonia Dana M.D.   On: 11/25/2015 14:22    Assessment/Plan  Systolic CHF, acute on chronic Patient Eu volemic. Started on Coreg low dose will continue. Continue on lasix and Losartan   Essential hypertension Patients BP has been consistently high. Once she is tolerating coreg we can increase her dose of coozar as higher dose of amlodipine gives her swelling.  Depression  Patient says she does not want any medicine for her mild depression. She said she does not like to take any meds and she is already on many. Will keep monitoring her for now.  Constipation  According to the nurses she is having problems pushing stools out. They are helping her with Per rectal stimulation. Will start her on senna.       Family/ staff Communication:   Labs/tests ordered:

## 2015-12-18 NOTE — Addendum Note (Signed)
Addended by: Vennie Homans on: 12/18/2015 03:08 PM   Modules accepted: Orders

## 2015-12-19 ENCOUNTER — Non-Acute Institutional Stay (SKILLED_NURSING_FACILITY): Payer: Medicare Other | Admitting: Internal Medicine

## 2015-12-19 ENCOUNTER — Encounter: Payer: Self-pay | Admitting: Internal Medicine

## 2015-12-19 DIAGNOSIS — E039 Hypothyroidism, unspecified: Secondary | ICD-10-CM | POA: Diagnosis not present

## 2015-12-19 DIAGNOSIS — I10 Essential (primary) hypertension: Secondary | ICD-10-CM | POA: Diagnosis not present

## 2015-12-19 DIAGNOSIS — I5023 Acute on chronic systolic (congestive) heart failure: Secondary | ICD-10-CM

## 2015-12-19 DIAGNOSIS — I429 Cardiomyopathy, unspecified: Secondary | ICD-10-CM | POA: Diagnosis not present

## 2015-12-19 DIAGNOSIS — I428 Other cardiomyopathies: Secondary | ICD-10-CM

## 2015-12-19 NOTE — Progress Notes (Signed)
Location:   [place] Room Number: 108/W Place of Service:  SNF (31)  Provider: Veleta Miners  PCP: Purvis Kilts, MD Patient Care Team: Sharilyn Sites, MD as PCP - General [facility])  Extended Emergency Contact Information Primary Emergency Contact: [address], [place] [address] Johnnette Litter of [place] Phone: [phone] Mobile Phone: [phone] Relation: Other Secondary Emergency Contact: Alenia, [place], [place] [address] Johnnette Litter of [place] Phone: [phone] Mobile Phone: [phone] Relation: Nephew  Code Status: DNR Goals of care:  Advanced Directive information Advanced Directives 12/19/2015  Does patient have an advance directive? Yes  Type of Advance Directive Out of facility DNR (pink MOST or yellow form)  Does patient want to make changes to advanced directive? No - Patient declined  Copy of advanced directive(s) in chart? Yes  Would patient like information on creating an advanced directive? -     Allergies  Allergen Reactions  . Aspirin     High doses.  Mack Hook [Levofloxacin In D5w]     Fuzzy headed  . Pantoprazole     Chief Complaint  Patient presents with  . Discharge Note    HPI:  82 y.o. female  With H/O Systolic CHF with Cardiomyopathy.,Hypertension, Hyperlipidemia and Hypothyroidism. She was admitted to SNF for Rehab after in hospital stay for Decompensated CHF. She did very well in the facility. And is now walking with the walker. She says that she gets tired and dizzy if she walks too much but otherwise No SOB, Chest pain or Dyspnea. Patient weight stayed stable. She was seen by Dr Claiborne Billings her cardiologist and was started on Coreg. Patient is going home with her caretaker who lives down the street and will check on her as needed.    Past Medical History:  Diagnosis Date  . Aortic stenosis   . Hyperlipidemia   . Hypertension   . Hypothyroidism   . LBBB (left  bundle branch block)   . Myocardial infarct ([place])    [id number]  . Nonischemic cardiomyopathy [facility])     Past Surgical History:  Procedure Laterality Date  . ABDOMINAL HYSTERECTOMY    . CHOLECYSTECTOMY    . EYE SURGERY        reports that she has never smoked. She has never used smokeless tobacco. She reports that she does not drink alcohol or use drugs. Social History   Social History  . Marital status: Single    Spouse name: N/A  . Number of children: N/A  . Years of education: N/A   Occupational History  . Not on file.   Social History Main Topics  . Smoking status: Never Smoker  . Smokeless tobacco: Never Used  . Alcohol use No  . Drug use: No  . Sexual activity: No   Other Topics Concern  . Not on file   Social History Narrative  . No narrative on file   Functional Status Survey:    Allergies  Allergen Reactions  . Aspirin     High doses.  Mack Hook [Levofloxacin In D5w]     Fuzzy headed  . Pantoprazole     Pertinent  Health Maintenance Due  Topic Date Due  . DEXA SCAN  04/12/1987  . PNA vac Low Risk Adult (1 of 2 - PCV13) 04/12/1987  . INFLUENZA VACCINE  02/21/2016 (Originally 10/22/2015)    Medications: Current Outpatient Prescriptions on File Prior  to Visit  Medication Sig Dispense Refill  . acetaminophen (TYLENOL) 325 MG tablet Take 650 mg by mouth every 4 (four) hours as needed.    Marland Kitchen amLODipine (NORVASC) 5 MG tablet Take 1 tablet (5 mg total) by mouth daily. 90 tablet 3  . aspirin EC 81 MG tablet Take 81 mg by mouth daily.    . carvedilol (COREG) 3.125 MG tablet Take 1 tablet (3.125 mg total) by mouth 2 (two) times daily. 60 tablet 6  . docusate sodium (COLACE) 100 MG capsule Take 100 mg by mouth 2 (two) times daily.    . feeding supplement, ENSURE ENLIVE, (ENSURE ENLIVE) LIQD Take 1 Bottle by mouth 2 (two) times daily between meals.    . fluticasone (FLONASE) 50 MCG/ACT nasal spray Place 1 spray into both nostrils 2 (two) times daily.    .  furosemide (LASIX) 20 MG tablet Take 0.5 tablets (10 mg total) by mouth daily. 45 tablet 3  . lansoprazole (PREVACID) 30 MG capsule TAKE 1 CAPSULE 2 TIMES DAILY BEFORE A MEAL AND OTHER MEDICATIONS. 60 capsule 2  . levothyroxine (SYNTHROID, LEVOTHROID) 50 MCG tablet Take 50 mcg by mouth daily before breakfast.    . losartan (COZAAR) 25 MG tablet Take 1 tablet (25 mg total) by mouth daily. 30 tablet 1  . Magnesium Hydroxide (MILK OF MAGNESIA PO) Give 30 ml by mouth as needed for constipation, may try fleets enema    . montelukast (SINGULAIR) 10 MG tablet Take 10 mg by mouth at bedtime.    . Multiple Vitamins-Minerals (PRESERVISION AREDS 2+MULTI VIT PO) Take by mouth. Take 1 tablet by mouth twice a day.    . phenylephrine (,USE FOR PREPARATION-H,) 0.25 % suppository Place 1 suppository rectally daily as needed for hemorrhoids.    . timolol (BETIMOL) 0.5 % ophthalmic solution Place 1 drop into both eyes 2 (two) times daily.     No current facility-administered medications on file prior to visit.      Review of Systems  Constitutional: Positive for fatigue. Negative for activity change, appetite change, chills, diaphoresis, fever and unexpected weight change.  Respiratory: Negative for apnea, cough, choking, chest tightness and shortness of breath.   Cardiovascular: Negative for chest pain, palpitations and leg swelling.  Gastrointestinal: Positive for constipation. Negative for abdominal distention, abdominal pain, anal bleeding, blood in stool, diarrhea and nausea.  Neurological: Positive for light-headedness. Negative for dizziness, tremors, seizures, syncope, speech difficulty and numbness.    Vitals:   12/19/15 0922  BP: (!) 132/50  Pulse: 79  Resp: 18  Temp: 98.3 F (36.8 C)  TempSrc: Oral  SpO2: 100%   There is no height or weight on file to calculate BMI. Physical Exam  Constitutional: She is oriented to person, place, and time. She appears well-nourished.  HENT:  Head:  Normocephalic.  Mouth/Throat: Oropharynx is clear and moist.  Cardiovascular: Normal rate, regular rhythm and normal heart sounds.   Pulmonary/Chest: Effort normal and breath sounds normal. No respiratory distress. She has no wheezes. She has no rales. She exhibits tenderness.  Abdominal: Soft. Bowel sounds are normal. She exhibits no distension. There is no tenderness. There is no rebound.  Musculoskeletal: She exhibits no edema.  Neurological: She is alert and oriented to person, place, and time.    Labs reviewed: Basic Metabolic Panel:  Recent Labs  11/26/15 0536 11/27/15 0608 12/02/15 0614  NA 133* 137 133*  K 3.5 3.9 3.9  CL 101 100* 100*  CO2 25 30 26   GLUCOSE  94 103* 95  BUN 10 20 19   CREATININE 0.48 0.58 0.52  CALCIUM 8.6* 9.2 8.9   Liver Function Tests:  Recent Labs  05/18/15 1303 08/26/15 1348  AST 22 15  ALT 13* 7  ALKPHOS 42 54  BILITOT 0.7 0.4  PROT 6.8 6.8  ALBUMIN 3.8 4.0   No results for input(s): LIPASE, AMYLASE in the last 8760 hours. No results for input(s): AMMONIA in the last 8760 hours. CBC:  Recent Labs  05/18/15 1303 11/25/15 1435 11/26/15 0536 12/02/15 0614  WBC 6.3 6.7 4.0 4.4  NEUTROABS 4.5  --   --  2.1  HGB 13.6 12.4 12.1 12.7  HCT 39.4 36.6 35.8* 38.7  MCV 90.6 92.0 92.7 94.4  PLT 265 309 235 285   Cardiac Enzymes:  Recent Labs  11/25/15 1435 11/25/15 2350 11/26/15 0536  TROPONINI <0.03 <0.03 <0.03   BNP: Invalid input(s): POCBNP CBG: No results for input(s): GLUCAP in the last 8760 hours.  Procedures and Imaging Studies During Stay: Dg Chest 1 View  Result Date: 11/25/2015 CLINICAL DATA:  Shortness of breath, hypertension, prior MI, non ischemic cardiomyopathy EXAM: CHEST 1 VIEW COMPARISON:  Portable exam 1411 hours compared to 07/07/2012 FINDINGS: Enlargement of cardiac silhouette with pulmonary vascular congestion. Atherosclerotic calcification aorta. Perihilar interstitial infiltrates likely representing  pulmonary edema and CHF. Bibasilar effusions and atelectasis. No pneumothorax. Bones demineralized. IMPRESSION: Probable CHF with bibasilar effusions and atelectasis. Electronically Signed   By: Lavonia Dana M.D.   On: 11/25/2015 14:22    Assessment/Plan:    Systolic CHF  Patient Euvolemic. Continue Low dose Lasix and coreg. Patient has Follow up with her PCP and will need follow up BMP. She will be also followed at home with Home health care.   Essential hypertension  BP has been variable with Systolic [id number]. Will Continue Losartan and Norvasc.   Hypothyroidism  Patients TSH was WNL in the Hospital. Continue same dose of Synthroid.   Patient is being discharged with the following home health services:    Patient is being discharged with the following durable medical equipment:    Patient has been advised to f/u with their PCP in 1-2 weeks to bring them up to date on their rehab stay.  Social services at facility was responsible for arranging this appointment.  Pt was provided with a 30 day supply of prescriptions for medications and refills must be obtained from their PCP.  For controlled substances, a more limited supply may be provided adequate until PCP appointment only.  Future labs/tests needed:  BMP

## 2015-12-23 DIAGNOSIS — I350 Nonrheumatic aortic (valve) stenosis: Secondary | ICD-10-CM | POA: Diagnosis not present

## 2015-12-23 DIAGNOSIS — I447 Left bundle-branch block, unspecified: Secondary | ICD-10-CM | POA: Diagnosis not present

## 2015-12-23 DIAGNOSIS — Z23 Encounter for immunization: Secondary | ICD-10-CM | POA: Diagnosis not present

## 2015-12-23 DIAGNOSIS — I420 Dilated cardiomyopathy: Secondary | ICD-10-CM | POA: Diagnosis not present

## 2015-12-23 DIAGNOSIS — E440 Moderate protein-calorie malnutrition: Secondary | ICD-10-CM | POA: Diagnosis not present

## 2015-12-23 DIAGNOSIS — E785 Hyperlipidemia, unspecified: Secondary | ICD-10-CM | POA: Diagnosis not present

## 2015-12-23 DIAGNOSIS — I5023 Acute on chronic systolic (congestive) heart failure: Secondary | ICD-10-CM | POA: Diagnosis not present

## 2015-12-23 DIAGNOSIS — I10 Essential (primary) hypertension: Secondary | ICD-10-CM | POA: Diagnosis not present

## 2015-12-23 DIAGNOSIS — I110 Hypertensive heart disease with heart failure: Secondary | ICD-10-CM | POA: Diagnosis not present

## 2015-12-23 DIAGNOSIS — E039 Hypothyroidism, unspecified: Secondary | ICD-10-CM | POA: Diagnosis not present

## 2015-12-23 DIAGNOSIS — I2510 Atherosclerotic heart disease of native coronary artery without angina pectoris: Secondary | ICD-10-CM | POA: Diagnosis not present

## 2015-12-23 DIAGNOSIS — Z681 Body mass index (BMI) 19 or less, adult: Secondary | ICD-10-CM | POA: Diagnosis not present

## 2015-12-25 DIAGNOSIS — I420 Dilated cardiomyopathy: Secondary | ICD-10-CM | POA: Diagnosis not present

## 2015-12-25 DIAGNOSIS — E440 Moderate protein-calorie malnutrition: Secondary | ICD-10-CM | POA: Diagnosis not present

## 2015-12-25 DIAGNOSIS — I5023 Acute on chronic systolic (congestive) heart failure: Secondary | ICD-10-CM | POA: Diagnosis not present

## 2015-12-25 DIAGNOSIS — I447 Left bundle-branch block, unspecified: Secondary | ICD-10-CM | POA: Diagnosis not present

## 2015-12-25 DIAGNOSIS — I110 Hypertensive heart disease with heart failure: Secondary | ICD-10-CM | POA: Diagnosis not present

## 2015-12-25 DIAGNOSIS — I350 Nonrheumatic aortic (valve) stenosis: Secondary | ICD-10-CM | POA: Diagnosis not present

## 2015-12-26 DIAGNOSIS — I350 Nonrheumatic aortic (valve) stenosis: Secondary | ICD-10-CM | POA: Diagnosis not present

## 2015-12-26 DIAGNOSIS — E440 Moderate protein-calorie malnutrition: Secondary | ICD-10-CM | POA: Diagnosis not present

## 2015-12-26 DIAGNOSIS — I447 Left bundle-branch block, unspecified: Secondary | ICD-10-CM | POA: Diagnosis not present

## 2015-12-26 DIAGNOSIS — I110 Hypertensive heart disease with heart failure: Secondary | ICD-10-CM | POA: Diagnosis not present

## 2015-12-26 DIAGNOSIS — I420 Dilated cardiomyopathy: Secondary | ICD-10-CM | POA: Diagnosis not present

## 2015-12-26 DIAGNOSIS — I5023 Acute on chronic systolic (congestive) heart failure: Secondary | ICD-10-CM | POA: Diagnosis not present

## 2015-12-27 DIAGNOSIS — E440 Moderate protein-calorie malnutrition: Secondary | ICD-10-CM | POA: Diagnosis not present

## 2015-12-27 DIAGNOSIS — I420 Dilated cardiomyopathy: Secondary | ICD-10-CM | POA: Diagnosis not present

## 2015-12-27 DIAGNOSIS — I447 Left bundle-branch block, unspecified: Secondary | ICD-10-CM | POA: Diagnosis not present

## 2015-12-27 DIAGNOSIS — I350 Nonrheumatic aortic (valve) stenosis: Secondary | ICD-10-CM | POA: Diagnosis not present

## 2015-12-27 DIAGNOSIS — I5023 Acute on chronic systolic (congestive) heart failure: Secondary | ICD-10-CM | POA: Diagnosis not present

## 2015-12-27 DIAGNOSIS — I110 Hypertensive heart disease with heart failure: Secondary | ICD-10-CM | POA: Diagnosis not present

## 2015-12-30 DIAGNOSIS — I5023 Acute on chronic systolic (congestive) heart failure: Secondary | ICD-10-CM | POA: Diagnosis not present

## 2015-12-30 DIAGNOSIS — I420 Dilated cardiomyopathy: Secondary | ICD-10-CM | POA: Diagnosis not present

## 2015-12-30 DIAGNOSIS — E440 Moderate protein-calorie malnutrition: Secondary | ICD-10-CM | POA: Diagnosis not present

## 2015-12-30 DIAGNOSIS — I350 Nonrheumatic aortic (valve) stenosis: Secondary | ICD-10-CM | POA: Diagnosis not present

## 2015-12-30 DIAGNOSIS — I447 Left bundle-branch block, unspecified: Secondary | ICD-10-CM | POA: Diagnosis not present

## 2015-12-30 DIAGNOSIS — I110 Hypertensive heart disease with heart failure: Secondary | ICD-10-CM | POA: Diagnosis not present

## 2015-12-31 DIAGNOSIS — I420 Dilated cardiomyopathy: Secondary | ICD-10-CM | POA: Diagnosis not present

## 2015-12-31 DIAGNOSIS — I5023 Acute on chronic systolic (congestive) heart failure: Secondary | ICD-10-CM | POA: Diagnosis not present

## 2015-12-31 DIAGNOSIS — I447 Left bundle-branch block, unspecified: Secondary | ICD-10-CM | POA: Diagnosis not present

## 2015-12-31 DIAGNOSIS — E440 Moderate protein-calorie malnutrition: Secondary | ICD-10-CM | POA: Diagnosis not present

## 2015-12-31 DIAGNOSIS — I350 Nonrheumatic aortic (valve) stenosis: Secondary | ICD-10-CM | POA: Diagnosis not present

## 2015-12-31 DIAGNOSIS — I110 Hypertensive heart disease with heart failure: Secondary | ICD-10-CM | POA: Diagnosis not present

## 2016-01-01 DIAGNOSIS — I447 Left bundle-branch block, unspecified: Secondary | ICD-10-CM | POA: Diagnosis not present

## 2016-01-01 DIAGNOSIS — I110 Hypertensive heart disease with heart failure: Secondary | ICD-10-CM | POA: Diagnosis not present

## 2016-01-01 DIAGNOSIS — I420 Dilated cardiomyopathy: Secondary | ICD-10-CM | POA: Diagnosis not present

## 2016-01-01 DIAGNOSIS — I5023 Acute on chronic systolic (congestive) heart failure: Secondary | ICD-10-CM | POA: Diagnosis not present

## 2016-01-01 DIAGNOSIS — E440 Moderate protein-calorie malnutrition: Secondary | ICD-10-CM | POA: Diagnosis not present

## 2016-01-01 DIAGNOSIS — I350 Nonrheumatic aortic (valve) stenosis: Secondary | ICD-10-CM | POA: Diagnosis not present

## 2016-01-02 DIAGNOSIS — I110 Hypertensive heart disease with heart failure: Secondary | ICD-10-CM | POA: Diagnosis not present

## 2016-01-02 DIAGNOSIS — I420 Dilated cardiomyopathy: Secondary | ICD-10-CM | POA: Diagnosis not present

## 2016-01-02 DIAGNOSIS — I5023 Acute on chronic systolic (congestive) heart failure: Secondary | ICD-10-CM | POA: Diagnosis not present

## 2016-01-02 DIAGNOSIS — I447 Left bundle-branch block, unspecified: Secondary | ICD-10-CM | POA: Diagnosis not present

## 2016-01-02 DIAGNOSIS — I350 Nonrheumatic aortic (valve) stenosis: Secondary | ICD-10-CM | POA: Diagnosis not present

## 2016-01-02 DIAGNOSIS — E440 Moderate protein-calorie malnutrition: Secondary | ICD-10-CM | POA: Diagnosis not present

## 2016-01-06 ENCOUNTER — Ambulatory Visit: Payer: Medicare Other | Admitting: Cardiovascular Disease

## 2016-01-16 DIAGNOSIS — H401111 Primary open-angle glaucoma, right eye, mild stage: Secondary | ICD-10-CM | POA: Diagnosis not present

## 2016-01-16 DIAGNOSIS — H02402 Unspecified ptosis of left eyelid: Secondary | ICD-10-CM | POA: Diagnosis not present

## 2016-01-16 DIAGNOSIS — H401122 Primary open-angle glaucoma, left eye, moderate stage: Secondary | ICD-10-CM | POA: Diagnosis not present

## 2016-01-20 ENCOUNTER — Other Ambulatory Visit (INDEPENDENT_AMBULATORY_CARE_PROVIDER_SITE_OTHER): Payer: Self-pay | Admitting: Internal Medicine

## 2016-01-21 NOTE — Telephone Encounter (Signed)
Patient will need to have an OV prior to further refills.

## 2016-01-23 ENCOUNTER — Encounter (INDEPENDENT_AMBULATORY_CARE_PROVIDER_SITE_OTHER): Payer: Self-pay | Admitting: Internal Medicine

## 2016-01-23 NOTE — Telephone Encounter (Signed)
Patient was given an appointment for 02/26/16 at 11:15am with Deberah Castle, NP.  A letter was mailed to the patient.

## 2016-02-24 DIAGNOSIS — H6521 Chronic serous otitis media, right ear: Secondary | ICD-10-CM | POA: Diagnosis not present

## 2016-02-24 DIAGNOSIS — J301 Allergic rhinitis due to pollen: Secondary | ICD-10-CM | POA: Diagnosis not present

## 2016-02-24 DIAGNOSIS — H6121 Impacted cerumen, right ear: Secondary | ICD-10-CM | POA: Diagnosis not present

## 2016-02-26 ENCOUNTER — Ambulatory Visit (INDEPENDENT_AMBULATORY_CARE_PROVIDER_SITE_OTHER): Payer: Medicare Other | Admitting: Internal Medicine

## 2016-03-03 ENCOUNTER — Other Ambulatory Visit: Payer: Self-pay | Admitting: Physician Assistant

## 2016-03-03 NOTE — Telephone Encounter (Signed)
Dr. Kelly pt.

## 2016-03-03 NOTE — Telephone Encounter (Signed)
REFILL 

## 2016-03-30 ENCOUNTER — Ambulatory Visit (INDEPENDENT_AMBULATORY_CARE_PROVIDER_SITE_OTHER): Payer: Medicare Other | Admitting: Otolaryngology

## 2016-03-30 DIAGNOSIS — J310 Chronic rhinitis: Secondary | ICD-10-CM

## 2016-03-30 DIAGNOSIS — J0101 Acute recurrent maxillary sinusitis: Secondary | ICD-10-CM

## 2016-03-30 DIAGNOSIS — H9071 Mixed conductive and sensorineural hearing loss, unilateral, right ear, with unrestricted hearing on the contralateral side: Secondary | ICD-10-CM

## 2016-03-30 DIAGNOSIS — H6521 Chronic serous otitis media, right ear: Secondary | ICD-10-CM

## 2016-03-30 DIAGNOSIS — H6981 Other specified disorders of Eustachian tube, right ear: Secondary | ICD-10-CM

## 2016-03-30 DIAGNOSIS — H903 Sensorineural hearing loss, bilateral: Secondary | ICD-10-CM | POA: Diagnosis not present

## 2016-04-13 ENCOUNTER — Ambulatory Visit (INDEPENDENT_AMBULATORY_CARE_PROVIDER_SITE_OTHER): Payer: Medicare Other | Admitting: Otolaryngology

## 2016-04-13 DIAGNOSIS — R42 Dizziness and giddiness: Secondary | ICD-10-CM

## 2016-04-13 DIAGNOSIS — H6983 Other specified disorders of Eustachian tube, bilateral: Secondary | ICD-10-CM

## 2016-04-13 DIAGNOSIS — J310 Chronic rhinitis: Secondary | ICD-10-CM | POA: Diagnosis not present

## 2016-04-13 DIAGNOSIS — H900 Conductive hearing loss, bilateral: Secondary | ICD-10-CM

## 2016-04-13 DIAGNOSIS — H903 Sensorineural hearing loss, bilateral: Secondary | ICD-10-CM

## 2016-04-13 DIAGNOSIS — H9011 Conductive hearing loss, unilateral, right ear, with unrestricted hearing on the contralateral side: Secondary | ICD-10-CM

## 2016-05-18 ENCOUNTER — Ambulatory Visit (INDEPENDENT_AMBULATORY_CARE_PROVIDER_SITE_OTHER): Payer: Medicare Other | Admitting: Otolaryngology

## 2016-05-18 DIAGNOSIS — H903 Sensorineural hearing loss, bilateral: Secondary | ICD-10-CM

## 2016-05-18 DIAGNOSIS — H6521 Chronic serous otitis media, right ear: Secondary | ICD-10-CM | POA: Diagnosis not present

## 2016-05-25 ENCOUNTER — Other Ambulatory Visit: Payer: Self-pay | Admitting: Cardiovascular Disease

## 2016-05-26 ENCOUNTER — Other Ambulatory Visit (INDEPENDENT_AMBULATORY_CARE_PROVIDER_SITE_OTHER): Payer: Self-pay | Admitting: Internal Medicine

## 2016-05-28 NOTE — Telephone Encounter (Signed)
Per Dr.Rehman the patient will need an appointment prior to further refills ,as she was last seen in our office on 06/18/2014. She may also get this medication from her PCP if she prefers.

## 2016-06-02 ENCOUNTER — Encounter: Payer: Self-pay | Admitting: Cardiovascular Disease

## 2016-06-02 ENCOUNTER — Ambulatory Visit (INDEPENDENT_AMBULATORY_CARE_PROVIDER_SITE_OTHER): Payer: Medicare Other | Admitting: Cardiovascular Disease

## 2016-06-02 VITALS — BP 146/60 | HR 69 | Ht 64.0 in | Wt 101.0 lb

## 2016-06-02 DIAGNOSIS — I428 Other cardiomyopathies: Secondary | ICD-10-CM

## 2016-06-02 DIAGNOSIS — I447 Left bundle-branch block, unspecified: Secondary | ICD-10-CM

## 2016-06-02 DIAGNOSIS — I10 Essential (primary) hypertension: Secondary | ICD-10-CM

## 2016-06-02 DIAGNOSIS — I359 Nonrheumatic aortic valve disorder, unspecified: Secondary | ICD-10-CM | POA: Diagnosis not present

## 2016-06-02 DIAGNOSIS — I272 Pulmonary hypertension, unspecified: Secondary | ICD-10-CM | POA: Diagnosis not present

## 2016-06-02 NOTE — Patient Instructions (Signed)
Medication Instructions:   NO CHANGE  Labwork:  Your physician recommends that you return for lab work WHEN FASTING  Follow-Up:  Your physician wants you to follow-up in: [name] will receive a reminder letter in the mail two months in advance. If you don't receive a letter, please call our office to schedule the follow-up appointment.   If you need a refill on your cardiac medications before your next appointment, please call your pharmacy.

## 2016-06-02 NOTE — Progress Notes (Signed)
Patient ID: Charlynn Salih, female   DOB: 14-Oct-1920, 83 y.o.   MRN: [id number]      Primary M.D.: Dr. Benjie Karvonen  HPI: Adalynn Corne is a 83 y.o. female who presents to the office for a 6 month cardiology evaluation.  Ms. Mixon  has a remote history of a nonischemic cardiomyopathy and in 2001 her ejection fraction was found to be 30%. This subsequently improved and on 07/20/2012 echo Doppler study ejection fraction was 40-45%. She had grade 1 diastolic dysfunction,mild/moderate pulmonary hypertension with a PA pressure of 48 mm, moderate tricuspid regurgitation, moderate mitral regurgitation with mitral annular calcification. She also has a history of left bundle branch block, hypertension, as well as hyperlipidemia.  When I last saw her one year ago, she was continuing to do well.  She denies any significant chest pain or palpitations.  She does have GERD and recently her Prevacid was changed to just 1 capsule daily by Dr. Claiborne Rigg.  In the past.  She did develop increased peripheral edema on amlodipine when her dose was at 10 mg, which did improve with dose reduction to 5 mg.  Last year she sustained a right heel fracture.  She has difficulty hearing and uses a hearing aid.  She has a history of hypothyroidism.  She has been on fenofibrate therapy.  She is unaware of PND orthopnea.    Over the past year, she had some issues with dehydration.  He denies chest pain or any major episodes of shortness of breath.  She presented to [facility] emergency room on 05/18/2015 complaining of being off balance and dizziness.  Her BNP was elevated at 874 and there was a murmur noted on physical exam.  She was seen in follow-up of her emergency room evaluation by Lattie Corns.  A follow-up echo Doppler study showed worsening of LV function with an EF of 25-30% with wall motion abnormality involving the anteroseptal myocardium.  At that time she was told to stop HCTZ.  Since she also apparently was on Lasix.  Her  blood pressure was elevated and amlodipine 5 mg was instituted.  She denies any further episodes of dizziness.    She was hospitalized from 11/25/2015 through 11/29/2015.  She presented with increasing shortness of breath several days of increased dyspnea on exertion.  During her hospitalization she was told to stop taking atenolol, fenofibrate , lisinopril, and meclizine.  She initially went to a nursing home following discharge, but ultimately now is at home by her self.  Advanced care directives were completed.  She is a DO NOT RESUSCITATE.    Over the past 6 months, she denies any episodes of chest pain or significant increasing dyspnea.  At times she may have some mild balance issues.  She denies palpitations.  Most recently she is on amlodipine at 5 mg, aspirin 81 mg, Lasix 10 mg, levothyroxine 50 g, losartan 25 mg, in addition to Singulair.  She denies chest pain.  She denies palpitations.  She presents for evaluation.  Past Medical History:  Diagnosis Date  . Aortic stenosis   . Hyperlipidemia   . Hypertension   . Hypothyroidism   . LBBB (left bundle branch block)   . Myocardial infarct    1995-1996  . Nonischemic cardiomyopathy [facility])     Past Surgical History:  Procedure Laterality Date  . ABDOMINAL HYSTERECTOMY    . CHOLECYSTECTOMY    . EYE SURGERY      Allergies  Allergen Reactions  . Aspirin  High doses.  Mack Hook [Levofloxacin In D5w]     Fuzzy headed  . Pantoprazole     Current Outpatient Prescriptions  Medication Sig Dispense Refill  . acetaminophen (TYLENOL) 325 MG tablet Take 650 mg by mouth every 4 (four) hours as needed.    Marland Kitchen amLODipine (NORVASC) 5 MG tablet TAKE (1) TABLET BY MOUTH ONCE DAILY. 90 tablet 0  . aspirin EC 81 MG tablet Take 81 mg by mouth daily.    . carvedilol (COREG) 3.125 MG tablet TAKE (1) TABLET BY MOUTH TWICE DAILY. 60 tablet 0  . feeding supplement, ENSURE ENLIVE, (ENSURE ENLIVE) LIQD Take 1 Bottle by mouth 2 (two) times daily  between meals.    . furosemide (LASIX) 20 MG tablet Take 0.5 tablets (10 mg total) by mouth daily. 45 tablet 3  . lansoprazole (PREVACID) 30 MG capsule TAKE 1 CAPSULE 2 TIMES DAILY BEFORE A MEAL AND OTHER MEDICATIONS. 60 capsule 2  . levothyroxine (SYNTHROID, LEVOTHROID) 50 MCG tablet Take 50 mcg by mouth daily before breakfast.    . montelukast (SINGULAIR) 10 MG tablet Take 10 mg by mouth at bedtime.    . phenylephrine (,USE FOR PREPARATION-H,) 0.25 % suppository Place 1 suppository rectally daily as needed for hemorrhoids.    . timolol (BETIMOL) 0.5 % ophthalmic solution Place 1 drop into both eyes 2 (two) times daily.     No current facility-administered medications for this visit.     Social History   Social History  . Marital status: Single    Spouse name: N/A  . Number of children: N/A  . Years of education: N/A   Occupational History  . Not on file.   Social History Main Topics  . Smoking status: Never Smoker  . Smokeless tobacco: Never Used  . Alcohol use No  . Drug use: No  . Sexual activity: No   Other Topics Concern  . Not on file   Social History Narrative  . No narrative on file      Socially she is a single has no children. No tobacco alcohol use  Family History  Problem Relation Age of Onset  . Heart attack Father   . Hypertension Brother   . Cancer Sister     ROS General: Negative; No fevers, chills, or night sweats;  HEENT: Positive for decreased hearing, she uses a hearing aid. no sinus congestion, difficulty swallowing Pulmonary: Negative; No cough, wheezing, shortness of breath, hemoptysis Cardiovascular: See history of present illness  ankle swelling has improved GI: Negative; No nausea, vomiting, diarrhea, or abdominal pain GU: Negative; No dysuria, hematuria, or difficulty voiding Musculoskeletal: Negative; no myalgias, joint pain, or weakness Hematologic/Oncology: Negative; no easy bruising, bleeding Endocrine: Negative; no heat/cold  intolerance Neuro: Mild vertigo; no , headaches Skin: Negative; No rashes or skin lesions Psychiatric: Negative; No behavioral problems, depression Sleep: Negative; No snoring, daytime sleepiness, hypersomnolence, bruxism, restless legs, hypnogognic hallucinations, no cataplexy  Other comprehensive 14 point system review is negative.  PE BP (!) 146/60   Pulse 69   Ht '5\' 4"'  (1.626 m)   Wt 101 lb (45.8 kg)   BMI 17.34 kg/m     Wt Readings from Last 3 Encounters:  06/02/16 101 lb (45.8 kg)  12/04/15 104 lb 2 oz (47.2 kg)  11/29/15 102 lb 9.6 oz (46.5 kg)   General: Alert, oriented, no distress.  Appears younger than stated age Skin: normal turgor, no rashes HEENT: Normocephalic, atraumatic. Pupils round and reactive; sclera anicteric;no lid lag.  Nose without nasal septal hypertrophy Mouth/Parynx benign; Mallinpatti scale 2 Neck: No JVD, no carotid bruis , with normal carotid upstroke Chest wall: Nontender to palpation Lungs: clear to ausculatation and percussion; no wheezing or rales Heart: RRR, s1 s2 normal 2/6 murmur in the aortic region. Split S2.  No S3 or S4 gallop.  No diastolic murmur, rubs, thrills or heaves. Abdomen: soft, nontender; no hepatosplenomehaly, BS+; abdominal aorta nontender and not dilated by palpation. Back: No CVA Pulses 2+ Extremities: Resolution of prior ankle edema;no clubbing cyanosis, Homan's sign negative  Neurologic: grossly nonfocal Psychological: Normal affect and mood  ECG (independently read by me): Normal sinus rhythm at 69 bpm.  Left bundle-branch block with repolarization changes.  QTc interval 471 ms.  September 2017  ECG (independently read by me): Sinus rhythm at 75 bpm.  Left bundle branch block with repolarization changes.  A 186 ms.  QTc interval 489 ms.  June 2017 ECG (independently read by me): Normal sinus rhythm with PACs.  Heart rate 63 bpm.  Mild first-degree AV block with a PR interval at 202 ms.  LVH with repolarization  changes.  April 2016 ECG (independently read by me): Normal sinus rhythm at 68 bpm.  Left bundle branch block with repolarization changes.  April 2015 ECG (independently read by me): Sinus bradycardia 54 beats per minute.  Left bundle branch block with repolarization changes.  Prior 12/28/2012 ECG: Sinus rhythm with left axis deviation and evidence for left bundle branch block. Rate 70 beats per minute.  LABS:   BMP Latest Ref Rng & Units 12/02/2015 11/27/2015 11/26/2015  Glucose 65 - 99 mg/dL 95 103(H) 94  BUN 6 - 20 mg/dL '19 20 10  ' Creatinine 0.44 - 1.00 mg/dL 0.52 0.58 0.48  BUN/Creat Ratio 12 - 28 - - -  Sodium 135 - 145 mmol/L 133(L) 137 133(L)  Potassium 3.5 - 5.1 mmol/L 3.9 3.9 3.5  Chloride 101 - 111 mmol/L 100(L) 100(L) 101  CO2 22 - 32 mmol/L '26 30 25  ' Calcium 8.9 - 10.3 mg/dL 8.9 9.2 8.6(L)    Hepatic Function Latest Ref Rng & Units 08/26/2015 05/18/2015 12/14/2013  Total Protein 6.0 - 8.5 g/dL 6.8 6.8 7.0  Albumin 3.2 - 4.6 g/dL 4.0 3.8 4.1  AST 0 - 40 IU/L '15 22 17  ' ALT 0 - 32 IU/L 7 13(L) 13  Alk Phosphatase 39 - 117 IU/L 54 42 34(L)  Total Bilirubin 0.0 - 1.2 mg/dL 0.4 0.7 0.8     CBC Latest Ref Rng & Units 12/02/2015 11/26/2015 11/25/2015  WBC 4.0 - 10.5 K/uL 4.4 4.0 6.7  Hemoglobin 12.0 - 15.0 g/dL 12.7 12.1 12.4  Hematocrit 36.0 - 46.0 % 38.7 35.8(L) 36.6  Platelets 150 - 400 K/uL 285 235 309   Lab Results  Component Value Date   MCV 94.4 12/02/2015   MCV 92.7 11/26/2015   MCV 92.0 11/25/2015   Lab Results  Component Value Date   TSH 2.531 11/25/2015   BNP No results found for: PROBNP  Lipid Panel     Component Value Date/Time   CHOL 140 07/24/2013 0933   TRIG 142 07/24/2013 0933   HDL 32 (L) 07/24/2013 0933   CHOLHDL 4.4 07/24/2013 0933   VLDL 28 07/24/2013 0933   LDLCALC 80 07/24/2013 0933     RADIOLOGY: No results found.  IMPRESSION:  1. NICM (nonischemic cardiomyopathy) ([place])   2. Non-ischemic cardiomyopathy ([place])   3. LBBB (left bundle  branch block)   4. Aortic valve disease  5. Essential hypertension   6. Pulmonary hypertension     ASSESSMENT AND PLAN: Ms. Sandy Haye is a 83 year old female who has a history of hypertension, GERD, hyperlipidemia, hypothyroidism and has a remote history of a cardiomyopathy.  Her initial LV function was 30% in 2001 and this had improved to approximate 45% on her echo Doppler study of April 2014. There was moderate pulmonary hypertension with a PA pressure estimated at 48 mm. She was hospitalized in February 2017.  Her BNP was elevated consistent with CHF. Her last echo Doppler study on 05/28/2015 demonstrated a reversion of her EF back to 25-30%.  She had mild aortic insufficiency, moderate mitral regurgitation, and severe left atrial dilatation with mild RA dilatation.  There was mild pulmonary hypertension with an estimated PA pressure 44 mm.  When  I saw her in June 2017 added low-dose lisinopril at 2.5 mg  which was changed to losartan 25 mg daily following her 2017 hospitalization..  When I last saw her, I added low-dose carvedilol at 3.125 mg twice a day.  She again has reiterated her DO NOT RESUSCITATE status.  She does not want further aggression efforts done.  She has noticed some mild balance issues.  Her blood pressure today is minimally increased on her current regimen.  There is no significant edema on her low-dose furosemide 10 mg, amlodipine 5 mg.  Past on higher dose amlodipine.  She had significant leg swelling.  She continues to take levothyroxine 50 g for hypothyroidism.  We will continue to treat her medically and not perform follow-up echo Doppler studies were nuclear stress tests.  Based on her wishes.  I will check fasting laboratory on her above medical regimen and adjustments will be made if necessary.  I will see her in 6 months for reevaluation.   Time spent: 25 minutes Troy Sine, MD, [facility]  06/02/2016 6:25 PM

## 2016-06-04 ENCOUNTER — Encounter (INDEPENDENT_AMBULATORY_CARE_PROVIDER_SITE_OTHER): Payer: Self-pay | Admitting: Internal Medicine

## 2016-06-04 NOTE — Telephone Encounter (Signed)
Patient was given an appointment for 06/29/16 at 11:30am.  A letter was mailed to the patient.

## 2016-06-25 ENCOUNTER — Ambulatory Visit (INDEPENDENT_AMBULATORY_CARE_PROVIDER_SITE_OTHER): Payer: Medicare Other | Admitting: Otolaryngology

## 2016-06-25 DIAGNOSIS — H7201 Central perforation of tympanic membrane, right ear: Secondary | ICD-10-CM

## 2016-06-25 DIAGNOSIS — H903 Sensorineural hearing loss, bilateral: Secondary | ICD-10-CM

## 2016-06-25 DIAGNOSIS — H6983 Other specified disorders of Eustachian tube, bilateral: Secondary | ICD-10-CM

## 2016-06-29 ENCOUNTER — Ambulatory Visit (INDEPENDENT_AMBULATORY_CARE_PROVIDER_SITE_OTHER): Payer: Medicare Other | Admitting: Internal Medicine

## 2016-07-20 ENCOUNTER — Other Ambulatory Visit: Payer: Self-pay | Admitting: Cardiovascular Disease

## 2016-07-20 ENCOUNTER — Other Ambulatory Visit (INDEPENDENT_AMBULATORY_CARE_PROVIDER_SITE_OTHER): Payer: Self-pay | Admitting: Internal Medicine

## 2016-07-20 NOTE — Telephone Encounter (Signed)
REFILL 

## 2016-07-21 NOTE — Telephone Encounter (Signed)
Per Dr.Rehman may fill this with 1 additional refill. Patient will need to have office appointment with [place] or she can get from PCP.

## 2016-08-13 ENCOUNTER — Ambulatory Visit (INDEPENDENT_AMBULATORY_CARE_PROVIDER_SITE_OTHER): Payer: Medicare Other | Admitting: Otolaryngology

## 2016-08-13 DIAGNOSIS — H6121 Impacted cerumen, right ear: Secondary | ICD-10-CM | POA: Diagnosis not present

## 2016-09-08 ENCOUNTER — Encounter (INDEPENDENT_AMBULATORY_CARE_PROVIDER_SITE_OTHER): Payer: Self-pay | Admitting: Internal Medicine

## 2016-09-08 NOTE — Telephone Encounter (Signed)
An appointment was given for 09/21/16 at 1:45pm with Deberah Castle, NP.  A letter was mailed to the patient.

## 2016-09-21 ENCOUNTER — Ambulatory Visit (INDEPENDENT_AMBULATORY_CARE_PROVIDER_SITE_OTHER): Payer: Medicare Other | Admitting: Internal Medicine

## 2016-10-18 IMAGING — DX DG HIP (WITH OR WITHOUT PELVIS) 5+V BILAT
5 series · 5 of 5 positions shown · non-contrast
Comparison: None.

CLINICAL DATA: Fall, right hip pain, right elbow pain

EXAM:
DG HIP (WITH OR WITHOUT PELVIS) 5+V BILAT

[pelvis ap]
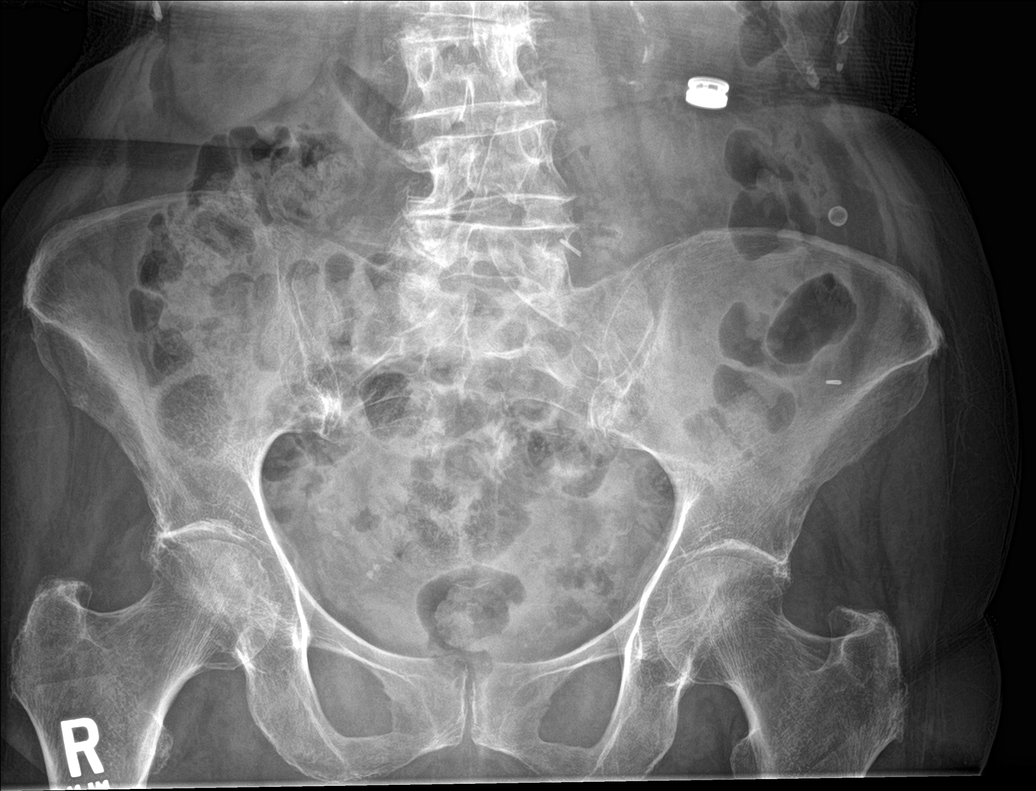

[hip ap (1 of 2)]
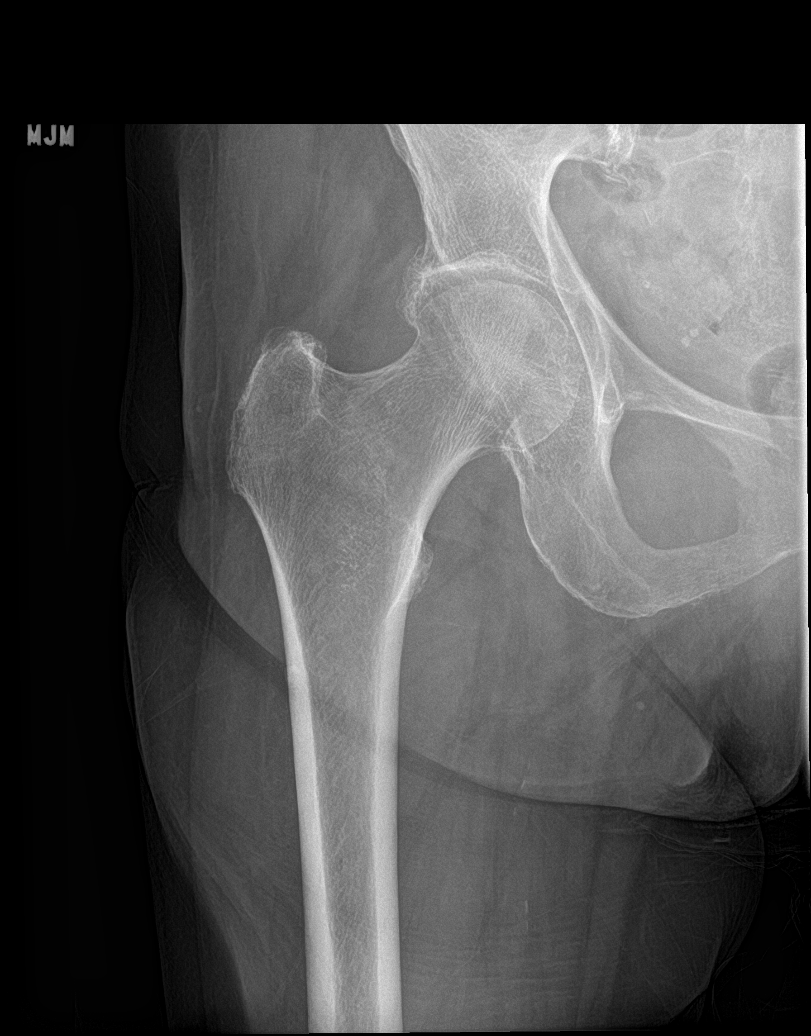

[hip lat (1 of 2)]
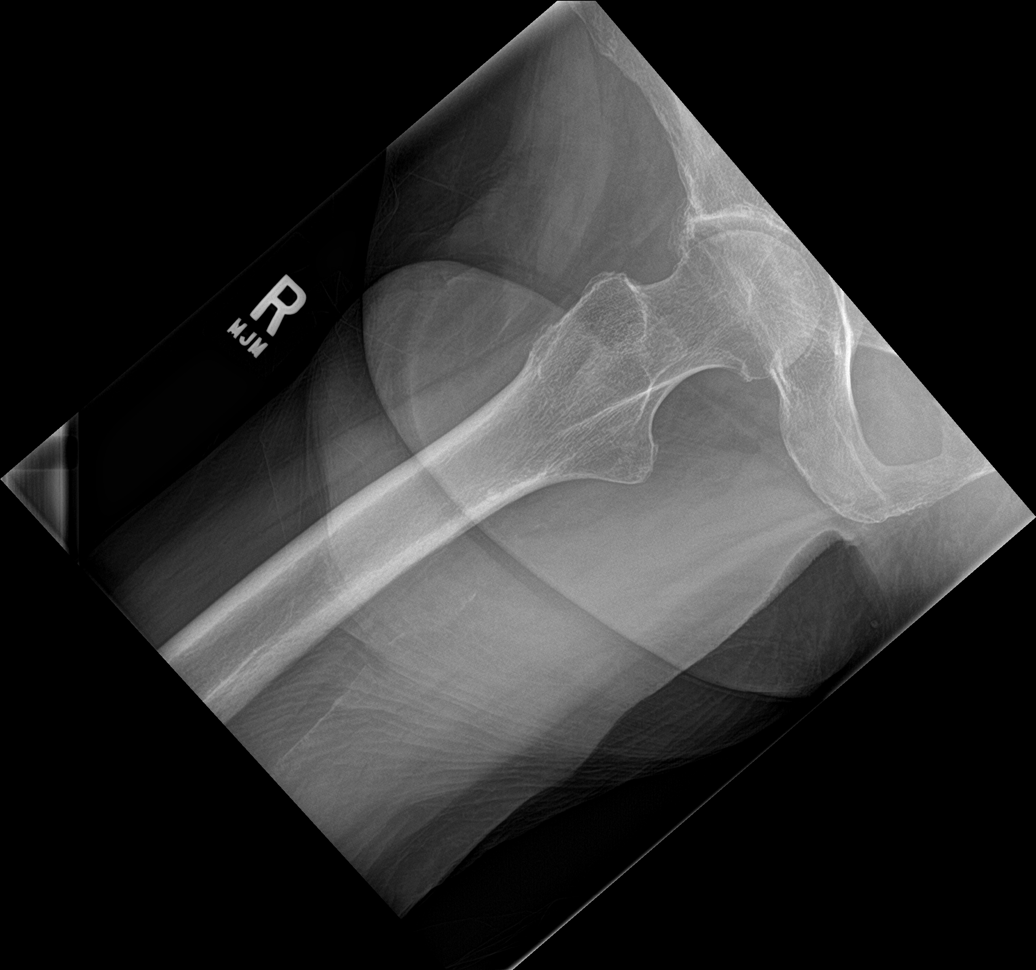

[hip ap (2 of 2)]
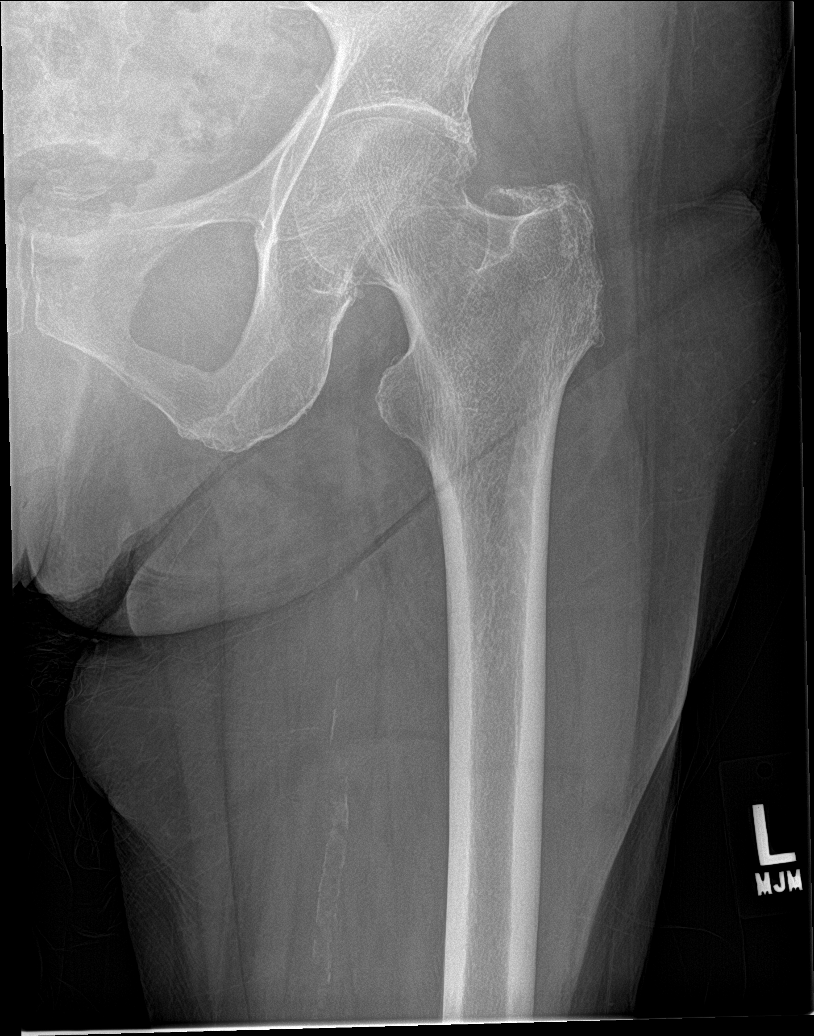

[hip lat (2 of 2)]
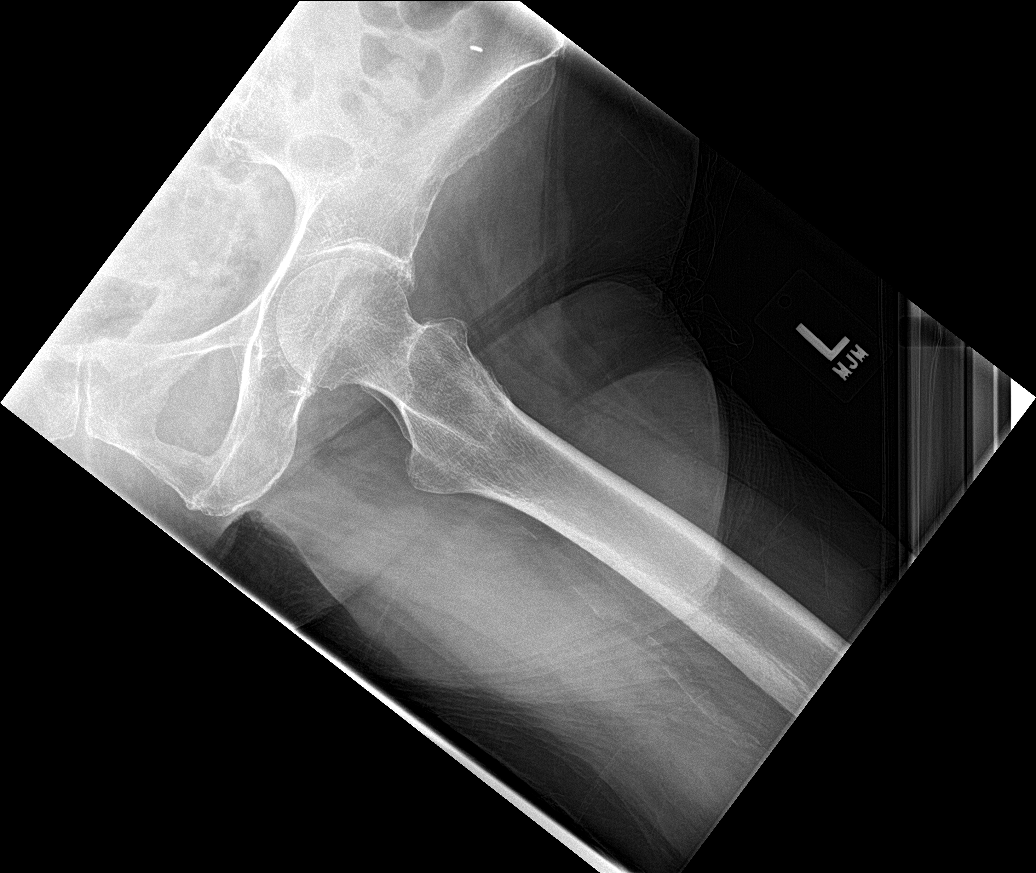

[5 of 5 positions shown; findings below may reference images not displayed]

FINDINGS: Five views bilateral hip submitted. No acute fracture or
subluxation. Diffuse osteopenia. Mild degenerative changes pubic
symphysis. Mild bilateral superior acetabular spurring. Degenerative
changes lower lumbar spine.
IMPRESSION: No acute fracture or subluxation. Diffuse osteopenia. Mild
degenerative changes as described above.

## 2017-01-12 ENCOUNTER — Ambulatory Visit: Payer: Medicare Other | Admitting: Cardiovascular Disease

## 2017-02-17 DIAGNOSIS — R630 Anorexia: Secondary | ICD-10-CM | POA: Diagnosis not present

## 2017-02-17 DIAGNOSIS — I519 Heart disease, unspecified: Secondary | ICD-10-CM | POA: Diagnosis not present

## 2017-02-17 DIAGNOSIS — R42 Dizziness and giddiness: Secondary | ICD-10-CM | POA: Diagnosis not present

## 2017-02-17 DIAGNOSIS — I4891 Unspecified atrial fibrillation: Secondary | ICD-10-CM | POA: Diagnosis not present

## 2017-02-17 DIAGNOSIS — R0602 Shortness of breath: Secondary | ICD-10-CM | POA: Diagnosis not present

## 2017-02-17 DIAGNOSIS — I10 Essential (primary) hypertension: Secondary | ICD-10-CM | POA: Diagnosis not present

## 2017-02-17 DIAGNOSIS — R531 Weakness: Secondary | ICD-10-CM | POA: Diagnosis not present

## 2017-02-17 DIAGNOSIS — H409 Unspecified glaucoma: Secondary | ICD-10-CM | POA: Diagnosis not present

## 2017-02-17 DIAGNOSIS — E782 Mixed hyperlipidemia: Secondary | ICD-10-CM | POA: Diagnosis not present

## 2017-02-17 DIAGNOSIS — E43 Unspecified severe protein-calorie malnutrition: Secondary | ICD-10-CM | POA: Diagnosis not present

## 2017-02-17 DIAGNOSIS — I509 Heart failure, unspecified: Secondary | ICD-10-CM | POA: Diagnosis not present

## 2017-02-17 DIAGNOSIS — K219 Gastro-esophageal reflux disease without esophagitis: Secondary | ICD-10-CM | POA: Diagnosis not present

## 2017-02-17 DIAGNOSIS — M1991 Primary osteoarthritis, unspecified site: Secondary | ICD-10-CM | POA: Diagnosis not present

## 2017-02-19 DIAGNOSIS — K219 Gastro-esophageal reflux disease without esophagitis: Secondary | ICD-10-CM | POA: Diagnosis not present

## 2017-02-19 DIAGNOSIS — I519 Heart disease, unspecified: Secondary | ICD-10-CM | POA: Diagnosis not present

## 2017-02-19 DIAGNOSIS — H409 Unspecified glaucoma: Secondary | ICD-10-CM | POA: Diagnosis not present

## 2017-02-19 DIAGNOSIS — E782 Mixed hyperlipidemia: Secondary | ICD-10-CM | POA: Diagnosis not present

## 2017-02-19 DIAGNOSIS — E43 Unspecified severe protein-calorie malnutrition: Secondary | ICD-10-CM | POA: Diagnosis not present

## 2017-02-19 DIAGNOSIS — I10 Essential (primary) hypertension: Secondary | ICD-10-CM | POA: Diagnosis not present

## 2017-02-20 DIAGNOSIS — I10 Essential (primary) hypertension: Secondary | ICD-10-CM | POA: Diagnosis not present

## 2017-02-20 DIAGNOSIS — R42 Dizziness and giddiness: Secondary | ICD-10-CM | POA: Diagnosis not present

## 2017-02-20 DIAGNOSIS — K219 Gastro-esophageal reflux disease without esophagitis: Secondary | ICD-10-CM | POA: Diagnosis not present

## 2017-02-20 DIAGNOSIS — H409 Unspecified glaucoma: Secondary | ICD-10-CM | POA: Diagnosis not present

## 2017-02-20 DIAGNOSIS — M1991 Primary osteoarthritis, unspecified site: Secondary | ICD-10-CM | POA: Diagnosis not present

## 2017-02-20 DIAGNOSIS — R630 Anorexia: Secondary | ICD-10-CM | POA: Diagnosis not present

## 2017-02-20 DIAGNOSIS — I4891 Unspecified atrial fibrillation: Secondary | ICD-10-CM | POA: Diagnosis not present

## 2017-02-20 DIAGNOSIS — I509 Heart failure, unspecified: Secondary | ICD-10-CM | POA: Diagnosis not present

## 2017-02-20 DIAGNOSIS — I519 Heart disease, unspecified: Secondary | ICD-10-CM | POA: Diagnosis not present

## 2017-02-20 DIAGNOSIS — E43 Unspecified severe protein-calorie malnutrition: Secondary | ICD-10-CM | POA: Diagnosis not present

## 2017-02-20 DIAGNOSIS — R531 Weakness: Secondary | ICD-10-CM | POA: Diagnosis not present

## 2017-02-20 DIAGNOSIS — R0602 Shortness of breath: Secondary | ICD-10-CM | POA: Diagnosis not present

## 2017-02-20 DIAGNOSIS — E782 Mixed hyperlipidemia: Secondary | ICD-10-CM | POA: Diagnosis not present

## 2017-02-22 DIAGNOSIS — H409 Unspecified glaucoma: Secondary | ICD-10-CM | POA: Diagnosis not present

## 2017-02-22 DIAGNOSIS — E43 Unspecified severe protein-calorie malnutrition: Secondary | ICD-10-CM | POA: Diagnosis not present

## 2017-02-22 DIAGNOSIS — I519 Heart disease, unspecified: Secondary | ICD-10-CM | POA: Diagnosis not present

## 2017-02-22 DIAGNOSIS — K219 Gastro-esophageal reflux disease without esophagitis: Secondary | ICD-10-CM | POA: Diagnosis not present

## 2017-02-22 DIAGNOSIS — I10 Essential (primary) hypertension: Secondary | ICD-10-CM | POA: Diagnosis not present

## 2017-02-22 DIAGNOSIS — E782 Mixed hyperlipidemia: Secondary | ICD-10-CM | POA: Diagnosis not present

## 2017-02-23 DIAGNOSIS — E43 Unspecified severe protein-calorie malnutrition: Secondary | ICD-10-CM | POA: Diagnosis not present

## 2017-02-23 DIAGNOSIS — K219 Gastro-esophageal reflux disease without esophagitis: Secondary | ICD-10-CM | POA: Diagnosis not present

## 2017-02-23 DIAGNOSIS — I10 Essential (primary) hypertension: Secondary | ICD-10-CM | POA: Diagnosis not present

## 2017-02-23 DIAGNOSIS — I519 Heart disease, unspecified: Secondary | ICD-10-CM | POA: Diagnosis not present

## 2017-02-23 DIAGNOSIS — H409 Unspecified glaucoma: Secondary | ICD-10-CM | POA: Diagnosis not present

## 2017-02-23 DIAGNOSIS — E782 Mixed hyperlipidemia: Secondary | ICD-10-CM | POA: Diagnosis not present

## 2017-03-03 DIAGNOSIS — E43 Unspecified severe protein-calorie malnutrition: Secondary | ICD-10-CM | POA: Diagnosis not present

## 2017-03-03 DIAGNOSIS — E782 Mixed hyperlipidemia: Secondary | ICD-10-CM | POA: Diagnosis not present

## 2017-03-03 DIAGNOSIS — I519 Heart disease, unspecified: Secondary | ICD-10-CM | POA: Diagnosis not present

## 2017-03-03 DIAGNOSIS — I10 Essential (primary) hypertension: Secondary | ICD-10-CM | POA: Diagnosis not present

## 2017-03-03 DIAGNOSIS — K219 Gastro-esophageal reflux disease without esophagitis: Secondary | ICD-10-CM | POA: Diagnosis not present

## 2017-03-03 DIAGNOSIS — H409 Unspecified glaucoma: Secondary | ICD-10-CM | POA: Diagnosis not present

## 2017-03-10 DIAGNOSIS — I10 Essential (primary) hypertension: Secondary | ICD-10-CM | POA: Diagnosis not present

## 2017-03-10 DIAGNOSIS — E43 Unspecified severe protein-calorie malnutrition: Secondary | ICD-10-CM | POA: Diagnosis not present

## 2017-03-10 DIAGNOSIS — E782 Mixed hyperlipidemia: Secondary | ICD-10-CM | POA: Diagnosis not present

## 2017-03-10 DIAGNOSIS — K219 Gastro-esophageal reflux disease without esophagitis: Secondary | ICD-10-CM | POA: Diagnosis not present

## 2017-03-10 DIAGNOSIS — H409 Unspecified glaucoma: Secondary | ICD-10-CM | POA: Diagnosis not present

## 2017-03-10 DIAGNOSIS — I519 Heart disease, unspecified: Secondary | ICD-10-CM | POA: Diagnosis not present

## 2017-03-15 DIAGNOSIS — K219 Gastro-esophageal reflux disease without esophagitis: Secondary | ICD-10-CM | POA: Diagnosis not present

## 2017-03-15 DIAGNOSIS — E43 Unspecified severe protein-calorie malnutrition: Secondary | ICD-10-CM | POA: Diagnosis not present

## 2017-03-15 DIAGNOSIS — I10 Essential (primary) hypertension: Secondary | ICD-10-CM | POA: Diagnosis not present

## 2017-03-15 DIAGNOSIS — I519 Heart disease, unspecified: Secondary | ICD-10-CM | POA: Diagnosis not present

## 2017-03-15 DIAGNOSIS — H409 Unspecified glaucoma: Secondary | ICD-10-CM | POA: Diagnosis not present

## 2017-03-15 DIAGNOSIS — E782 Mixed hyperlipidemia: Secondary | ICD-10-CM | POA: Diagnosis not present

## 2017-03-17 DIAGNOSIS — E782 Mixed hyperlipidemia: Secondary | ICD-10-CM | POA: Diagnosis not present

## 2017-03-17 DIAGNOSIS — I10 Essential (primary) hypertension: Secondary | ICD-10-CM | POA: Diagnosis not present

## 2017-03-17 DIAGNOSIS — E43 Unspecified severe protein-calorie malnutrition: Secondary | ICD-10-CM | POA: Diagnosis not present

## 2017-03-17 DIAGNOSIS — H409 Unspecified glaucoma: Secondary | ICD-10-CM | POA: Diagnosis not present

## 2017-03-17 DIAGNOSIS — K219 Gastro-esophageal reflux disease without esophagitis: Secondary | ICD-10-CM | POA: Diagnosis not present

## 2017-03-17 DIAGNOSIS — I519 Heart disease, unspecified: Secondary | ICD-10-CM | POA: Diagnosis not present

## 2017-03-18 DIAGNOSIS — I10 Essential (primary) hypertension: Secondary | ICD-10-CM | POA: Diagnosis not present

## 2017-03-18 DIAGNOSIS — H409 Unspecified glaucoma: Secondary | ICD-10-CM | POA: Diagnosis not present

## 2017-03-18 DIAGNOSIS — E43 Unspecified severe protein-calorie malnutrition: Secondary | ICD-10-CM | POA: Diagnosis not present

## 2017-03-18 DIAGNOSIS — K219 Gastro-esophageal reflux disease without esophagitis: Secondary | ICD-10-CM | POA: Diagnosis not present

## 2017-03-18 DIAGNOSIS — E782 Mixed hyperlipidemia: Secondary | ICD-10-CM | POA: Diagnosis not present

## 2017-03-18 DIAGNOSIS — I519 Heart disease, unspecified: Secondary | ICD-10-CM | POA: Diagnosis not present

## 2017-03-23 DIAGNOSIS — H409 Unspecified glaucoma: Secondary | ICD-10-CM | POA: Diagnosis not present

## 2017-03-23 DIAGNOSIS — K219 Gastro-esophageal reflux disease without esophagitis: Secondary | ICD-10-CM | POA: Diagnosis not present

## 2017-03-23 DIAGNOSIS — E782 Mixed hyperlipidemia: Secondary | ICD-10-CM | POA: Diagnosis not present

## 2017-03-23 DIAGNOSIS — I519 Heart disease, unspecified: Secondary | ICD-10-CM | POA: Diagnosis not present

## 2017-03-23 DIAGNOSIS — I4891 Unspecified atrial fibrillation: Secondary | ICD-10-CM | POA: Diagnosis not present

## 2017-03-23 DIAGNOSIS — M1991 Primary osteoarthritis, unspecified site: Secondary | ICD-10-CM | POA: Diagnosis not present

## 2017-03-23 DIAGNOSIS — R42 Dizziness and giddiness: Secondary | ICD-10-CM | POA: Diagnosis not present

## 2017-03-23 DIAGNOSIS — R0602 Shortness of breath: Secondary | ICD-10-CM | POA: Diagnosis not present

## 2017-03-23 DIAGNOSIS — R630 Anorexia: Secondary | ICD-10-CM | POA: Diagnosis not present

## 2017-03-23 DIAGNOSIS — R531 Weakness: Secondary | ICD-10-CM | POA: Diagnosis not present

## 2017-03-23 DIAGNOSIS — E43 Unspecified severe protein-calorie malnutrition: Secondary | ICD-10-CM | POA: Diagnosis not present

## 2017-03-23 DIAGNOSIS — I10 Essential (primary) hypertension: Secondary | ICD-10-CM | POA: Diagnosis not present

## 2017-03-23 DIAGNOSIS — I509 Heart failure, unspecified: Secondary | ICD-10-CM | POA: Diagnosis not present

## 2017-03-24 DIAGNOSIS — E782 Mixed hyperlipidemia: Secondary | ICD-10-CM | POA: Diagnosis not present

## 2017-03-24 DIAGNOSIS — K219 Gastro-esophageal reflux disease without esophagitis: Secondary | ICD-10-CM | POA: Diagnosis not present

## 2017-03-24 DIAGNOSIS — I519 Heart disease, unspecified: Secondary | ICD-10-CM | POA: Diagnosis not present

## 2017-03-24 DIAGNOSIS — H409 Unspecified glaucoma: Secondary | ICD-10-CM | POA: Diagnosis not present

## 2017-03-24 DIAGNOSIS — E43 Unspecified severe protein-calorie malnutrition: Secondary | ICD-10-CM | POA: Diagnosis not present

## 2017-03-24 DIAGNOSIS — I10 Essential (primary) hypertension: Secondary | ICD-10-CM | POA: Diagnosis not present

## 2017-03-25 DIAGNOSIS — E782 Mixed hyperlipidemia: Secondary | ICD-10-CM | POA: Diagnosis not present

## 2017-03-25 DIAGNOSIS — I519 Heart disease, unspecified: Secondary | ICD-10-CM | POA: Diagnosis not present

## 2017-03-25 DIAGNOSIS — I10 Essential (primary) hypertension: Secondary | ICD-10-CM | POA: Diagnosis not present

## 2017-03-25 DIAGNOSIS — H409 Unspecified glaucoma: Secondary | ICD-10-CM | POA: Diagnosis not present

## 2017-03-25 DIAGNOSIS — E43 Unspecified severe protein-calorie malnutrition: Secondary | ICD-10-CM | POA: Diagnosis not present

## 2017-03-25 DIAGNOSIS — K219 Gastro-esophageal reflux disease without esophagitis: Secondary | ICD-10-CM | POA: Diagnosis not present

## 2017-03-29 DIAGNOSIS — E43 Unspecified severe protein-calorie malnutrition: Secondary | ICD-10-CM | POA: Diagnosis not present

## 2017-03-29 DIAGNOSIS — I519 Heart disease, unspecified: Secondary | ICD-10-CM | POA: Diagnosis not present

## 2017-03-29 DIAGNOSIS — I10 Essential (primary) hypertension: Secondary | ICD-10-CM | POA: Diagnosis not present

## 2017-03-29 DIAGNOSIS — K219 Gastro-esophageal reflux disease without esophagitis: Secondary | ICD-10-CM | POA: Diagnosis not present

## 2017-03-29 DIAGNOSIS — E782 Mixed hyperlipidemia: Secondary | ICD-10-CM | POA: Diagnosis not present

## 2017-03-29 DIAGNOSIS — H409 Unspecified glaucoma: Secondary | ICD-10-CM | POA: Diagnosis not present

## 2017-04-01 DIAGNOSIS — I10 Essential (primary) hypertension: Secondary | ICD-10-CM | POA: Diagnosis not present

## 2017-04-01 DIAGNOSIS — K219 Gastro-esophageal reflux disease without esophagitis: Secondary | ICD-10-CM | POA: Diagnosis not present

## 2017-04-01 DIAGNOSIS — I519 Heart disease, unspecified: Secondary | ICD-10-CM | POA: Diagnosis not present

## 2017-04-01 DIAGNOSIS — E782 Mixed hyperlipidemia: Secondary | ICD-10-CM | POA: Diagnosis not present

## 2017-04-01 DIAGNOSIS — H409 Unspecified glaucoma: Secondary | ICD-10-CM | POA: Diagnosis not present

## 2017-04-01 DIAGNOSIS — E43 Unspecified severe protein-calorie malnutrition: Secondary | ICD-10-CM | POA: Diagnosis not present

## 2017-04-08 DIAGNOSIS — H409 Unspecified glaucoma: Secondary | ICD-10-CM | POA: Diagnosis not present

## 2017-04-08 DIAGNOSIS — E782 Mixed hyperlipidemia: Secondary | ICD-10-CM | POA: Diagnosis not present

## 2017-04-08 DIAGNOSIS — I519 Heart disease, unspecified: Secondary | ICD-10-CM | POA: Diagnosis not present

## 2017-04-08 DIAGNOSIS — K219 Gastro-esophageal reflux disease without esophagitis: Secondary | ICD-10-CM | POA: Diagnosis not present

## 2017-04-08 DIAGNOSIS — E43 Unspecified severe protein-calorie malnutrition: Secondary | ICD-10-CM | POA: Diagnosis not present

## 2017-04-08 DIAGNOSIS — I10 Essential (primary) hypertension: Secondary | ICD-10-CM | POA: Diagnosis not present

## 2017-04-14 DIAGNOSIS — H409 Unspecified glaucoma: Secondary | ICD-10-CM | POA: Diagnosis not present

## 2017-04-14 DIAGNOSIS — E782 Mixed hyperlipidemia: Secondary | ICD-10-CM | POA: Diagnosis not present

## 2017-04-14 DIAGNOSIS — I10 Essential (primary) hypertension: Secondary | ICD-10-CM | POA: Diagnosis not present

## 2017-04-14 DIAGNOSIS — I519 Heart disease, unspecified: Secondary | ICD-10-CM | POA: Diagnosis not present

## 2017-04-14 DIAGNOSIS — E43 Unspecified severe protein-calorie malnutrition: Secondary | ICD-10-CM | POA: Diagnosis not present

## 2017-04-14 DIAGNOSIS — K219 Gastro-esophageal reflux disease without esophagitis: Secondary | ICD-10-CM | POA: Diagnosis not present

## 2017-04-16 DIAGNOSIS — H409 Unspecified glaucoma: Secondary | ICD-10-CM | POA: Diagnosis not present

## 2017-04-16 DIAGNOSIS — E43 Unspecified severe protein-calorie malnutrition: Secondary | ICD-10-CM | POA: Diagnosis not present

## 2017-04-16 DIAGNOSIS — I10 Essential (primary) hypertension: Secondary | ICD-10-CM | POA: Diagnosis not present

## 2017-04-16 DIAGNOSIS — K219 Gastro-esophageal reflux disease without esophagitis: Secondary | ICD-10-CM | POA: Diagnosis not present

## 2017-04-16 DIAGNOSIS — E782 Mixed hyperlipidemia: Secondary | ICD-10-CM | POA: Diagnosis not present

## 2017-04-16 DIAGNOSIS — I519 Heart disease, unspecified: Secondary | ICD-10-CM | POA: Diagnosis not present

## 2017-04-21 DIAGNOSIS — E43 Unspecified severe protein-calorie malnutrition: Secondary | ICD-10-CM | POA: Diagnosis not present

## 2017-04-21 DIAGNOSIS — I519 Heart disease, unspecified: Secondary | ICD-10-CM | POA: Diagnosis not present

## 2017-04-21 DIAGNOSIS — I10 Essential (primary) hypertension: Secondary | ICD-10-CM | POA: Diagnosis not present

## 2017-04-21 DIAGNOSIS — H409 Unspecified glaucoma: Secondary | ICD-10-CM | POA: Diagnosis not present

## 2017-04-21 DIAGNOSIS — E782 Mixed hyperlipidemia: Secondary | ICD-10-CM | POA: Diagnosis not present

## 2017-04-21 DIAGNOSIS — K219 Gastro-esophageal reflux disease without esophagitis: Secondary | ICD-10-CM | POA: Diagnosis not present

## 2017-04-22 DIAGNOSIS — H409 Unspecified glaucoma: Secondary | ICD-10-CM | POA: Diagnosis not present

## 2017-04-22 DIAGNOSIS — E782 Mixed hyperlipidemia: Secondary | ICD-10-CM | POA: Diagnosis not present

## 2017-04-22 DIAGNOSIS — I519 Heart disease, unspecified: Secondary | ICD-10-CM | POA: Diagnosis not present

## 2017-04-22 DIAGNOSIS — K219 Gastro-esophageal reflux disease without esophagitis: Secondary | ICD-10-CM | POA: Diagnosis not present

## 2017-04-22 DIAGNOSIS — I10 Essential (primary) hypertension: Secondary | ICD-10-CM | POA: Diagnosis not present

## 2017-04-22 DIAGNOSIS — E43 Unspecified severe protein-calorie malnutrition: Secondary | ICD-10-CM | POA: Diagnosis not present

## 2017-04-23 DIAGNOSIS — R531 Weakness: Secondary | ICD-10-CM | POA: Diagnosis not present

## 2017-04-23 DIAGNOSIS — K219 Gastro-esophageal reflux disease without esophagitis: Secondary | ICD-10-CM | POA: Diagnosis not present

## 2017-04-23 DIAGNOSIS — I519 Heart disease, unspecified: Secondary | ICD-10-CM | POA: Diagnosis not present

## 2017-04-23 DIAGNOSIS — I509 Heart failure, unspecified: Secondary | ICD-10-CM | POA: Diagnosis not present

## 2017-04-23 DIAGNOSIS — R0602 Shortness of breath: Secondary | ICD-10-CM | POA: Diagnosis not present

## 2017-04-23 DIAGNOSIS — H409 Unspecified glaucoma: Secondary | ICD-10-CM | POA: Diagnosis not present

## 2017-04-23 DIAGNOSIS — R630 Anorexia: Secondary | ICD-10-CM | POA: Diagnosis not present

## 2017-04-23 DIAGNOSIS — M1991 Primary osteoarthritis, unspecified site: Secondary | ICD-10-CM | POA: Diagnosis not present

## 2017-04-23 DIAGNOSIS — R42 Dizziness and giddiness: Secondary | ICD-10-CM | POA: Diagnosis not present

## 2017-04-23 DIAGNOSIS — E782 Mixed hyperlipidemia: Secondary | ICD-10-CM | POA: Diagnosis not present

## 2017-04-23 DIAGNOSIS — I10 Essential (primary) hypertension: Secondary | ICD-10-CM | POA: Diagnosis not present

## 2017-04-23 DIAGNOSIS — E43 Unspecified severe protein-calorie malnutrition: Secondary | ICD-10-CM | POA: Diagnosis not present

## 2017-04-23 DIAGNOSIS — I4891 Unspecified atrial fibrillation: Secondary | ICD-10-CM | POA: Diagnosis not present

## 2017-04-29 DIAGNOSIS — E43 Unspecified severe protein-calorie malnutrition: Secondary | ICD-10-CM | POA: Diagnosis not present

## 2017-04-29 DIAGNOSIS — I519 Heart disease, unspecified: Secondary | ICD-10-CM | POA: Diagnosis not present

## 2017-04-29 DIAGNOSIS — K219 Gastro-esophageal reflux disease without esophagitis: Secondary | ICD-10-CM | POA: Diagnosis not present

## 2017-04-29 DIAGNOSIS — H409 Unspecified glaucoma: Secondary | ICD-10-CM | POA: Diagnosis not present

## 2017-04-29 DIAGNOSIS — E782 Mixed hyperlipidemia: Secondary | ICD-10-CM | POA: Diagnosis not present

## 2017-04-29 DIAGNOSIS — I10 Essential (primary) hypertension: Secondary | ICD-10-CM | POA: Diagnosis not present

## 2017-05-05 DIAGNOSIS — E43 Unspecified severe protein-calorie malnutrition: Secondary | ICD-10-CM | POA: Diagnosis not present

## 2017-05-05 DIAGNOSIS — E782 Mixed hyperlipidemia: Secondary | ICD-10-CM | POA: Diagnosis not present

## 2017-05-05 DIAGNOSIS — I519 Heart disease, unspecified: Secondary | ICD-10-CM | POA: Diagnosis not present

## 2017-05-05 DIAGNOSIS — I10 Essential (primary) hypertension: Secondary | ICD-10-CM | POA: Diagnosis not present

## 2017-05-05 DIAGNOSIS — H409 Unspecified glaucoma: Secondary | ICD-10-CM | POA: Diagnosis not present

## 2017-05-05 DIAGNOSIS — K219 Gastro-esophageal reflux disease without esophagitis: Secondary | ICD-10-CM | POA: Diagnosis not present

## 2017-05-12 DIAGNOSIS — H409 Unspecified glaucoma: Secondary | ICD-10-CM | POA: Diagnosis not present

## 2017-05-12 DIAGNOSIS — E43 Unspecified severe protein-calorie malnutrition: Secondary | ICD-10-CM | POA: Diagnosis not present

## 2017-05-12 DIAGNOSIS — I10 Essential (primary) hypertension: Secondary | ICD-10-CM | POA: Diagnosis not present

## 2017-05-12 DIAGNOSIS — K219 Gastro-esophageal reflux disease without esophagitis: Secondary | ICD-10-CM | POA: Diagnosis not present

## 2017-05-12 DIAGNOSIS — I519 Heart disease, unspecified: Secondary | ICD-10-CM | POA: Diagnosis not present

## 2017-05-12 DIAGNOSIS — E782 Mixed hyperlipidemia: Secondary | ICD-10-CM | POA: Diagnosis not present

## 2017-05-13 DIAGNOSIS — E43 Unspecified severe protein-calorie malnutrition: Secondary | ICD-10-CM | POA: Diagnosis not present

## 2017-05-13 DIAGNOSIS — H409 Unspecified glaucoma: Secondary | ICD-10-CM | POA: Diagnosis not present

## 2017-05-13 DIAGNOSIS — I10 Essential (primary) hypertension: Secondary | ICD-10-CM | POA: Diagnosis not present

## 2017-05-13 DIAGNOSIS — I519 Heart disease, unspecified: Secondary | ICD-10-CM | POA: Diagnosis not present

## 2017-05-13 DIAGNOSIS — K219 Gastro-esophageal reflux disease without esophagitis: Secondary | ICD-10-CM | POA: Diagnosis not present

## 2017-05-13 DIAGNOSIS — E782 Mixed hyperlipidemia: Secondary | ICD-10-CM | POA: Diagnosis not present

## 2017-05-17 DIAGNOSIS — E43 Unspecified severe protein-calorie malnutrition: Secondary | ICD-10-CM | POA: Diagnosis not present

## 2017-05-17 DIAGNOSIS — K219 Gastro-esophageal reflux disease without esophagitis: Secondary | ICD-10-CM | POA: Diagnosis not present

## 2017-05-17 DIAGNOSIS — H409 Unspecified glaucoma: Secondary | ICD-10-CM | POA: Diagnosis not present

## 2017-05-17 DIAGNOSIS — I10 Essential (primary) hypertension: Secondary | ICD-10-CM | POA: Diagnosis not present

## 2017-05-17 DIAGNOSIS — I519 Heart disease, unspecified: Secondary | ICD-10-CM | POA: Diagnosis not present

## 2017-05-17 DIAGNOSIS — E782 Mixed hyperlipidemia: Secondary | ICD-10-CM | POA: Diagnosis not present

## 2017-05-19 DIAGNOSIS — H409 Unspecified glaucoma: Secondary | ICD-10-CM | POA: Diagnosis not present

## 2017-05-19 DIAGNOSIS — E782 Mixed hyperlipidemia: Secondary | ICD-10-CM | POA: Diagnosis not present

## 2017-05-19 DIAGNOSIS — E43 Unspecified severe protein-calorie malnutrition: Secondary | ICD-10-CM | POA: Diagnosis not present

## 2017-05-19 DIAGNOSIS — K219 Gastro-esophageal reflux disease without esophagitis: Secondary | ICD-10-CM | POA: Diagnosis not present

## 2017-05-19 DIAGNOSIS — I10 Essential (primary) hypertension: Secondary | ICD-10-CM | POA: Diagnosis not present

## 2017-05-19 DIAGNOSIS — I519 Heart disease, unspecified: Secondary | ICD-10-CM | POA: Diagnosis not present

## 2017-05-20 DIAGNOSIS — K219 Gastro-esophageal reflux disease without esophagitis: Secondary | ICD-10-CM | POA: Diagnosis not present

## 2017-05-20 DIAGNOSIS — I428 Other cardiomyopathies: Secondary | ICD-10-CM | POA: Diagnosis not present

## 2017-05-20 DIAGNOSIS — I509 Heart failure, unspecified: Secondary | ICD-10-CM | POA: Diagnosis not present

## 2017-05-20 DIAGNOSIS — R7309 Other abnormal glucose: Secondary | ICD-10-CM | POA: Diagnosis not present

## 2017-05-20 DIAGNOSIS — E440 Moderate protein-calorie malnutrition: Secondary | ICD-10-CM | POA: Diagnosis not present

## 2017-05-20 DIAGNOSIS — F419 Anxiety disorder, unspecified: Secondary | ICD-10-CM | POA: Diagnosis not present

## 2017-05-20 DIAGNOSIS — E441 Mild protein-calorie malnutrition: Secondary | ICD-10-CM | POA: Diagnosis not present

## 2017-05-20 DIAGNOSIS — Z681 Body mass index (BMI) 19 or less, adult: Secondary | ICD-10-CM | POA: Diagnosis not present

## 2017-05-20 DIAGNOSIS — I2510 Atherosclerotic heart disease of native coronary artery without angina pectoris: Secondary | ICD-10-CM | POA: Diagnosis not present

## 2017-05-20 DIAGNOSIS — I4891 Unspecified atrial fibrillation: Secondary | ICD-10-CM | POA: Diagnosis not present

## 2017-05-20 DIAGNOSIS — R531 Weakness: Secondary | ICD-10-CM | POA: Diagnosis not present

## 2017-05-20 DIAGNOSIS — Z1389 Encounter for screening for other disorder: Secondary | ICD-10-CM | POA: Diagnosis not present

## 2017-05-21 DIAGNOSIS — R42 Dizziness and giddiness: Secondary | ICD-10-CM | POA: Diagnosis not present

## 2017-05-21 DIAGNOSIS — I509 Heart failure, unspecified: Secondary | ICD-10-CM | POA: Diagnosis not present

## 2017-05-21 DIAGNOSIS — R630 Anorexia: Secondary | ICD-10-CM | POA: Diagnosis not present

## 2017-05-21 DIAGNOSIS — I519 Heart disease, unspecified: Secondary | ICD-10-CM | POA: Diagnosis not present

## 2017-05-21 DIAGNOSIS — E43 Unspecified severe protein-calorie malnutrition: Secondary | ICD-10-CM | POA: Diagnosis not present

## 2017-05-21 DIAGNOSIS — M1991 Primary osteoarthritis, unspecified site: Secondary | ICD-10-CM | POA: Diagnosis not present

## 2017-05-21 DIAGNOSIS — I4891 Unspecified atrial fibrillation: Secondary | ICD-10-CM | POA: Diagnosis not present

## 2017-05-21 DIAGNOSIS — H409 Unspecified glaucoma: Secondary | ICD-10-CM | POA: Diagnosis not present

## 2017-05-21 DIAGNOSIS — R0602 Shortness of breath: Secondary | ICD-10-CM | POA: Diagnosis not present

## 2017-05-21 DIAGNOSIS — E782 Mixed hyperlipidemia: Secondary | ICD-10-CM | POA: Diagnosis not present

## 2017-05-21 DIAGNOSIS — R531 Weakness: Secondary | ICD-10-CM | POA: Diagnosis not present

## 2017-05-21 DIAGNOSIS — K219 Gastro-esophageal reflux disease without esophagitis: Secondary | ICD-10-CM | POA: Diagnosis not present

## 2017-05-21 DIAGNOSIS — I10 Essential (primary) hypertension: Secondary | ICD-10-CM | POA: Diagnosis not present

## 2017-05-26 DIAGNOSIS — H409 Unspecified glaucoma: Secondary | ICD-10-CM | POA: Diagnosis not present

## 2017-05-26 DIAGNOSIS — I10 Essential (primary) hypertension: Secondary | ICD-10-CM | POA: Diagnosis not present

## 2017-05-26 DIAGNOSIS — E43 Unspecified severe protein-calorie malnutrition: Secondary | ICD-10-CM | POA: Diagnosis not present

## 2017-05-26 DIAGNOSIS — I519 Heart disease, unspecified: Secondary | ICD-10-CM | POA: Diagnosis not present

## 2017-05-26 DIAGNOSIS — K219 Gastro-esophageal reflux disease without esophagitis: Secondary | ICD-10-CM | POA: Diagnosis not present

## 2017-05-26 DIAGNOSIS — E782 Mixed hyperlipidemia: Secondary | ICD-10-CM | POA: Diagnosis not present

## 2017-05-28 DIAGNOSIS — E43 Unspecified severe protein-calorie malnutrition: Secondary | ICD-10-CM | POA: Diagnosis not present

## 2017-05-28 DIAGNOSIS — K219 Gastro-esophageal reflux disease without esophagitis: Secondary | ICD-10-CM | POA: Diagnosis not present

## 2017-05-28 DIAGNOSIS — E782 Mixed hyperlipidemia: Secondary | ICD-10-CM | POA: Diagnosis not present

## 2017-05-28 DIAGNOSIS — H409 Unspecified glaucoma: Secondary | ICD-10-CM | POA: Diagnosis not present

## 2017-05-28 DIAGNOSIS — I519 Heart disease, unspecified: Secondary | ICD-10-CM | POA: Diagnosis not present

## 2017-05-28 DIAGNOSIS — I10 Essential (primary) hypertension: Secondary | ICD-10-CM | POA: Diagnosis not present

## 2017-06-04 DIAGNOSIS — H409 Unspecified glaucoma: Secondary | ICD-10-CM | POA: Diagnosis not present

## 2017-06-04 DIAGNOSIS — I10 Essential (primary) hypertension: Secondary | ICD-10-CM | POA: Diagnosis not present

## 2017-06-04 DIAGNOSIS — E782 Mixed hyperlipidemia: Secondary | ICD-10-CM | POA: Diagnosis not present

## 2017-06-04 DIAGNOSIS — I519 Heart disease, unspecified: Secondary | ICD-10-CM | POA: Diagnosis not present

## 2017-06-04 DIAGNOSIS — K219 Gastro-esophageal reflux disease without esophagitis: Secondary | ICD-10-CM | POA: Diagnosis not present

## 2017-06-04 DIAGNOSIS — E43 Unspecified severe protein-calorie malnutrition: Secondary | ICD-10-CM | POA: Diagnosis not present

## 2017-06-07 DIAGNOSIS — E782 Mixed hyperlipidemia: Secondary | ICD-10-CM | POA: Diagnosis not present

## 2017-06-07 DIAGNOSIS — I519 Heart disease, unspecified: Secondary | ICD-10-CM | POA: Diagnosis not present

## 2017-06-07 DIAGNOSIS — K219 Gastro-esophageal reflux disease without esophagitis: Secondary | ICD-10-CM | POA: Diagnosis not present

## 2017-06-07 DIAGNOSIS — H409 Unspecified glaucoma: Secondary | ICD-10-CM | POA: Diagnosis not present

## 2017-06-07 DIAGNOSIS — E43 Unspecified severe protein-calorie malnutrition: Secondary | ICD-10-CM | POA: Diagnosis not present

## 2017-06-07 DIAGNOSIS — I10 Essential (primary) hypertension: Secondary | ICD-10-CM | POA: Diagnosis not present

## 2017-06-09 DIAGNOSIS — E43 Unspecified severe protein-calorie malnutrition: Secondary | ICD-10-CM | POA: Diagnosis not present

## 2017-06-09 DIAGNOSIS — I10 Essential (primary) hypertension: Secondary | ICD-10-CM | POA: Diagnosis not present

## 2017-06-09 DIAGNOSIS — H409 Unspecified glaucoma: Secondary | ICD-10-CM | POA: Diagnosis not present

## 2017-06-09 DIAGNOSIS — K219 Gastro-esophageal reflux disease without esophagitis: Secondary | ICD-10-CM | POA: Diagnosis not present

## 2017-06-09 DIAGNOSIS — E782 Mixed hyperlipidemia: Secondary | ICD-10-CM | POA: Diagnosis not present

## 2017-06-09 DIAGNOSIS — I519 Heart disease, unspecified: Secondary | ICD-10-CM | POA: Diagnosis not present

## 2017-06-11 DIAGNOSIS — E43 Unspecified severe protein-calorie malnutrition: Secondary | ICD-10-CM | POA: Diagnosis not present

## 2017-06-11 DIAGNOSIS — I10 Essential (primary) hypertension: Secondary | ICD-10-CM | POA: Diagnosis not present

## 2017-06-11 DIAGNOSIS — I519 Heart disease, unspecified: Secondary | ICD-10-CM | POA: Diagnosis not present

## 2017-06-11 DIAGNOSIS — K219 Gastro-esophageal reflux disease without esophagitis: Secondary | ICD-10-CM | POA: Diagnosis not present

## 2017-06-11 DIAGNOSIS — H409 Unspecified glaucoma: Secondary | ICD-10-CM | POA: Diagnosis not present

## 2017-06-11 DIAGNOSIS — E782 Mixed hyperlipidemia: Secondary | ICD-10-CM | POA: Diagnosis not present

## 2017-06-17 DIAGNOSIS — I519 Heart disease, unspecified: Secondary | ICD-10-CM | POA: Diagnosis not present

## 2017-06-17 DIAGNOSIS — H409 Unspecified glaucoma: Secondary | ICD-10-CM | POA: Diagnosis not present

## 2017-06-17 DIAGNOSIS — E43 Unspecified severe protein-calorie malnutrition: Secondary | ICD-10-CM | POA: Diagnosis not present

## 2017-06-17 DIAGNOSIS — I10 Essential (primary) hypertension: Secondary | ICD-10-CM | POA: Diagnosis not present

## 2017-06-17 DIAGNOSIS — M1991 Primary osteoarthritis, unspecified site: Secondary | ICD-10-CM | POA: Diagnosis not present

## 2017-06-17 DIAGNOSIS — K219 Gastro-esophageal reflux disease without esophagitis: Secondary | ICD-10-CM | POA: Diagnosis not present

## 2017-06-17 DIAGNOSIS — E063 Autoimmune thyroiditis: Secondary | ICD-10-CM | POA: Diagnosis not present

## 2017-06-17 DIAGNOSIS — E782 Mixed hyperlipidemia: Secondary | ICD-10-CM | POA: Diagnosis not present

## 2017-06-17 DIAGNOSIS — Z681 Body mass index (BMI) 19 or less, adult: Secondary | ICD-10-CM | POA: Diagnosis not present

## 2017-06-21 DIAGNOSIS — K219 Gastro-esophageal reflux disease without esophagitis: Secondary | ICD-10-CM | POA: Diagnosis not present

## 2017-06-21 DIAGNOSIS — R42 Dizziness and giddiness: Secondary | ICD-10-CM | POA: Diagnosis not present

## 2017-06-21 DIAGNOSIS — I519 Heart disease, unspecified: Secondary | ICD-10-CM | POA: Diagnosis not present

## 2017-06-21 DIAGNOSIS — E43 Unspecified severe protein-calorie malnutrition: Secondary | ICD-10-CM | POA: Diagnosis not present

## 2017-06-21 DIAGNOSIS — I10 Essential (primary) hypertension: Secondary | ICD-10-CM | POA: Diagnosis not present

## 2017-06-21 DIAGNOSIS — H409 Unspecified glaucoma: Secondary | ICD-10-CM | POA: Diagnosis not present

## 2017-06-21 DIAGNOSIS — I509 Heart failure, unspecified: Secondary | ICD-10-CM | POA: Diagnosis not present

## 2017-06-21 DIAGNOSIS — M1991 Primary osteoarthritis, unspecified site: Secondary | ICD-10-CM | POA: Diagnosis not present

## 2017-06-21 DIAGNOSIS — R531 Weakness: Secondary | ICD-10-CM | POA: Diagnosis not present

## 2017-06-21 DIAGNOSIS — R0602 Shortness of breath: Secondary | ICD-10-CM | POA: Diagnosis not present

## 2017-06-21 DIAGNOSIS — R630 Anorexia: Secondary | ICD-10-CM | POA: Diagnosis not present

## 2017-06-21 DIAGNOSIS — E782 Mixed hyperlipidemia: Secondary | ICD-10-CM | POA: Diagnosis not present

## 2017-06-21 DIAGNOSIS — I4891 Unspecified atrial fibrillation: Secondary | ICD-10-CM | POA: Diagnosis not present

## 2017-06-22 DIAGNOSIS — H353134 Nonexudative age-related macular degeneration, bilateral, advanced atrophic with subfoveal involvement: Secondary | ICD-10-CM | POA: Diagnosis not present

## 2017-06-22 DIAGNOSIS — Z961 Presence of intraocular lens: Secondary | ICD-10-CM | POA: Diagnosis not present

## 2017-06-22 DIAGNOSIS — H401122 Primary open-angle glaucoma, left eye, moderate stage: Secondary | ICD-10-CM | POA: Diagnosis not present

## 2017-06-22 DIAGNOSIS — H04123 Dry eye syndrome of bilateral lacrimal glands: Secondary | ICD-10-CM | POA: Diagnosis not present

## 2017-06-22 DIAGNOSIS — H401111 Primary open-angle glaucoma, right eye, mild stage: Secondary | ICD-10-CM | POA: Diagnosis not present

## 2017-06-22 DIAGNOSIS — H10413 Chronic giant papillary conjunctivitis, bilateral: Secondary | ICD-10-CM | POA: Diagnosis not present

## 2017-06-24 DIAGNOSIS — I10 Essential (primary) hypertension: Secondary | ICD-10-CM | POA: Diagnosis not present

## 2017-06-24 DIAGNOSIS — I519 Heart disease, unspecified: Secondary | ICD-10-CM | POA: Diagnosis not present

## 2017-06-24 DIAGNOSIS — K219 Gastro-esophageal reflux disease without esophagitis: Secondary | ICD-10-CM | POA: Diagnosis not present

## 2017-06-24 DIAGNOSIS — E43 Unspecified severe protein-calorie malnutrition: Secondary | ICD-10-CM | POA: Diagnosis not present

## 2017-06-24 DIAGNOSIS — H409 Unspecified glaucoma: Secondary | ICD-10-CM | POA: Diagnosis not present

## 2017-06-24 DIAGNOSIS — E782 Mixed hyperlipidemia: Secondary | ICD-10-CM | POA: Diagnosis not present

## 2017-06-30 DIAGNOSIS — E782 Mixed hyperlipidemia: Secondary | ICD-10-CM | POA: Diagnosis not present

## 2017-06-30 DIAGNOSIS — I519 Heart disease, unspecified: Secondary | ICD-10-CM | POA: Diagnosis not present

## 2017-06-30 DIAGNOSIS — K219 Gastro-esophageal reflux disease without esophagitis: Secondary | ICD-10-CM | POA: Diagnosis not present

## 2017-06-30 DIAGNOSIS — E43 Unspecified severe protein-calorie malnutrition: Secondary | ICD-10-CM | POA: Diagnosis not present

## 2017-06-30 DIAGNOSIS — H409 Unspecified glaucoma: Secondary | ICD-10-CM | POA: Diagnosis not present

## 2017-06-30 DIAGNOSIS — I10 Essential (primary) hypertension: Secondary | ICD-10-CM | POA: Diagnosis not present

## 2017-07-02 DIAGNOSIS — I519 Heart disease, unspecified: Secondary | ICD-10-CM | POA: Diagnosis not present

## 2017-07-02 DIAGNOSIS — K219 Gastro-esophageal reflux disease without esophagitis: Secondary | ICD-10-CM | POA: Diagnosis not present

## 2017-07-02 DIAGNOSIS — H409 Unspecified glaucoma: Secondary | ICD-10-CM | POA: Diagnosis not present

## 2017-07-02 DIAGNOSIS — E43 Unspecified severe protein-calorie malnutrition: Secondary | ICD-10-CM | POA: Diagnosis not present

## 2017-07-02 DIAGNOSIS — E782 Mixed hyperlipidemia: Secondary | ICD-10-CM | POA: Diagnosis not present

## 2017-07-02 DIAGNOSIS — I10 Essential (primary) hypertension: Secondary | ICD-10-CM | POA: Diagnosis not present

## 2017-07-07 DIAGNOSIS — H409 Unspecified glaucoma: Secondary | ICD-10-CM | POA: Diagnosis not present

## 2017-07-07 DIAGNOSIS — I10 Essential (primary) hypertension: Secondary | ICD-10-CM | POA: Diagnosis not present

## 2017-07-07 DIAGNOSIS — E43 Unspecified severe protein-calorie malnutrition: Secondary | ICD-10-CM | POA: Diagnosis not present

## 2017-07-07 DIAGNOSIS — I519 Heart disease, unspecified: Secondary | ICD-10-CM | POA: Diagnosis not present

## 2017-07-07 DIAGNOSIS — K219 Gastro-esophageal reflux disease without esophagitis: Secondary | ICD-10-CM | POA: Diagnosis not present

## 2017-07-07 DIAGNOSIS — E782 Mixed hyperlipidemia: Secondary | ICD-10-CM | POA: Diagnosis not present

## 2017-07-08 DIAGNOSIS — I519 Heart disease, unspecified: Secondary | ICD-10-CM | POA: Diagnosis not present

## 2017-07-08 DIAGNOSIS — I10 Essential (primary) hypertension: Secondary | ICD-10-CM | POA: Diagnosis not present

## 2017-07-08 DIAGNOSIS — H409 Unspecified glaucoma: Secondary | ICD-10-CM | POA: Diagnosis not present

## 2017-07-08 DIAGNOSIS — K219 Gastro-esophageal reflux disease without esophagitis: Secondary | ICD-10-CM | POA: Diagnosis not present

## 2017-07-08 DIAGNOSIS — E782 Mixed hyperlipidemia: Secondary | ICD-10-CM | POA: Diagnosis not present

## 2017-07-08 DIAGNOSIS — E43 Unspecified severe protein-calorie malnutrition: Secondary | ICD-10-CM | POA: Diagnosis not present

## 2017-07-16 DIAGNOSIS — E43 Unspecified severe protein-calorie malnutrition: Secondary | ICD-10-CM | POA: Diagnosis not present

## 2017-07-16 DIAGNOSIS — I519 Heart disease, unspecified: Secondary | ICD-10-CM | POA: Diagnosis not present

## 2017-07-16 DIAGNOSIS — K219 Gastro-esophageal reflux disease without esophagitis: Secondary | ICD-10-CM | POA: Diagnosis not present

## 2017-07-16 DIAGNOSIS — H409 Unspecified glaucoma: Secondary | ICD-10-CM | POA: Diagnosis not present

## 2017-07-16 DIAGNOSIS — I10 Essential (primary) hypertension: Secondary | ICD-10-CM | POA: Diagnosis not present

## 2017-07-16 DIAGNOSIS — E782 Mixed hyperlipidemia: Secondary | ICD-10-CM | POA: Diagnosis not present

## 2017-07-21 DIAGNOSIS — I519 Heart disease, unspecified: Secondary | ICD-10-CM | POA: Diagnosis not present

## 2017-07-21 DIAGNOSIS — E782 Mixed hyperlipidemia: Secondary | ICD-10-CM | POA: Diagnosis not present

## 2017-07-21 DIAGNOSIS — R0602 Shortness of breath: Secondary | ICD-10-CM | POA: Diagnosis not present

## 2017-07-21 DIAGNOSIS — E43 Unspecified severe protein-calorie malnutrition: Secondary | ICD-10-CM | POA: Diagnosis not present

## 2017-07-21 DIAGNOSIS — I509 Heart failure, unspecified: Secondary | ICD-10-CM | POA: Diagnosis not present

## 2017-07-21 DIAGNOSIS — I4891 Unspecified atrial fibrillation: Secondary | ICD-10-CM | POA: Diagnosis not present

## 2017-07-21 DIAGNOSIS — H409 Unspecified glaucoma: Secondary | ICD-10-CM | POA: Diagnosis not present

## 2017-07-21 DIAGNOSIS — R630 Anorexia: Secondary | ICD-10-CM | POA: Diagnosis not present

## 2017-07-21 DIAGNOSIS — R531 Weakness: Secondary | ICD-10-CM | POA: Diagnosis not present

## 2017-07-21 DIAGNOSIS — R42 Dizziness and giddiness: Secondary | ICD-10-CM | POA: Diagnosis not present

## 2017-07-21 DIAGNOSIS — K219 Gastro-esophageal reflux disease without esophagitis: Secondary | ICD-10-CM | POA: Diagnosis not present

## 2017-07-21 DIAGNOSIS — I10 Essential (primary) hypertension: Secondary | ICD-10-CM | POA: Diagnosis not present

## 2017-07-21 DIAGNOSIS — M1991 Primary osteoarthritis, unspecified site: Secondary | ICD-10-CM | POA: Diagnosis not present

## 2017-07-22 DIAGNOSIS — E782 Mixed hyperlipidemia: Secondary | ICD-10-CM | POA: Diagnosis not present

## 2017-07-22 DIAGNOSIS — E43 Unspecified severe protein-calorie malnutrition: Secondary | ICD-10-CM | POA: Diagnosis not present

## 2017-07-22 DIAGNOSIS — I10 Essential (primary) hypertension: Secondary | ICD-10-CM | POA: Diagnosis not present

## 2017-07-22 DIAGNOSIS — H409 Unspecified glaucoma: Secondary | ICD-10-CM | POA: Diagnosis not present

## 2017-07-22 DIAGNOSIS — I519 Heart disease, unspecified: Secondary | ICD-10-CM | POA: Diagnosis not present

## 2017-07-22 DIAGNOSIS — K219 Gastro-esophageal reflux disease without esophagitis: Secondary | ICD-10-CM | POA: Diagnosis not present

## 2017-07-28 DIAGNOSIS — E43 Unspecified severe protein-calorie malnutrition: Secondary | ICD-10-CM | POA: Diagnosis not present

## 2017-07-28 DIAGNOSIS — I519 Heart disease, unspecified: Secondary | ICD-10-CM | POA: Diagnosis not present

## 2017-07-28 DIAGNOSIS — K219 Gastro-esophageal reflux disease without esophagitis: Secondary | ICD-10-CM | POA: Diagnosis not present

## 2017-07-28 DIAGNOSIS — E782 Mixed hyperlipidemia: Secondary | ICD-10-CM | POA: Diagnosis not present

## 2017-07-28 DIAGNOSIS — H409 Unspecified glaucoma: Secondary | ICD-10-CM | POA: Diagnosis not present

## 2017-07-28 DIAGNOSIS — I10 Essential (primary) hypertension: Secondary | ICD-10-CM | POA: Diagnosis not present

## 2017-07-29 DIAGNOSIS — E782 Mixed hyperlipidemia: Secondary | ICD-10-CM | POA: Diagnosis not present

## 2017-07-29 DIAGNOSIS — H409 Unspecified glaucoma: Secondary | ICD-10-CM | POA: Diagnosis not present

## 2017-07-29 DIAGNOSIS — I10 Essential (primary) hypertension: Secondary | ICD-10-CM | POA: Diagnosis not present

## 2017-07-29 DIAGNOSIS — I519 Heart disease, unspecified: Secondary | ICD-10-CM | POA: Diagnosis not present

## 2017-07-29 DIAGNOSIS — K219 Gastro-esophageal reflux disease without esophagitis: Secondary | ICD-10-CM | POA: Diagnosis not present

## 2017-07-29 DIAGNOSIS — E43 Unspecified severe protein-calorie malnutrition: Secondary | ICD-10-CM | POA: Diagnosis not present

## 2017-08-04 DIAGNOSIS — I10 Essential (primary) hypertension: Secondary | ICD-10-CM | POA: Diagnosis not present

## 2017-08-04 DIAGNOSIS — E43 Unspecified severe protein-calorie malnutrition: Secondary | ICD-10-CM | POA: Diagnosis not present

## 2017-08-04 DIAGNOSIS — H409 Unspecified glaucoma: Secondary | ICD-10-CM | POA: Diagnosis not present

## 2017-08-04 DIAGNOSIS — E782 Mixed hyperlipidemia: Secondary | ICD-10-CM | POA: Diagnosis not present

## 2017-08-04 DIAGNOSIS — I519 Heart disease, unspecified: Secondary | ICD-10-CM | POA: Diagnosis not present

## 2017-08-04 DIAGNOSIS — K219 Gastro-esophageal reflux disease without esophagitis: Secondary | ICD-10-CM | POA: Diagnosis not present

## 2017-08-05 DIAGNOSIS — E43 Unspecified severe protein-calorie malnutrition: Secondary | ICD-10-CM | POA: Diagnosis not present

## 2017-08-05 DIAGNOSIS — I519 Heart disease, unspecified: Secondary | ICD-10-CM | POA: Diagnosis not present

## 2017-08-05 DIAGNOSIS — I10 Essential (primary) hypertension: Secondary | ICD-10-CM | POA: Diagnosis not present

## 2017-08-05 DIAGNOSIS — K219 Gastro-esophageal reflux disease without esophagitis: Secondary | ICD-10-CM | POA: Diagnosis not present

## 2017-08-05 DIAGNOSIS — E782 Mixed hyperlipidemia: Secondary | ICD-10-CM | POA: Diagnosis not present

## 2017-08-05 DIAGNOSIS — H409 Unspecified glaucoma: Secondary | ICD-10-CM | POA: Diagnosis not present

## 2017-08-11 DIAGNOSIS — K219 Gastro-esophageal reflux disease without esophagitis: Secondary | ICD-10-CM | POA: Diagnosis not present

## 2017-08-11 DIAGNOSIS — I519 Heart disease, unspecified: Secondary | ICD-10-CM | POA: Diagnosis not present

## 2017-08-11 DIAGNOSIS — E43 Unspecified severe protein-calorie malnutrition: Secondary | ICD-10-CM | POA: Diagnosis not present

## 2017-08-11 DIAGNOSIS — H409 Unspecified glaucoma: Secondary | ICD-10-CM | POA: Diagnosis not present

## 2017-08-11 DIAGNOSIS — E782 Mixed hyperlipidemia: Secondary | ICD-10-CM | POA: Diagnosis not present

## 2017-08-11 DIAGNOSIS — I10 Essential (primary) hypertension: Secondary | ICD-10-CM | POA: Diagnosis not present

## 2017-08-12 DIAGNOSIS — E782 Mixed hyperlipidemia: Secondary | ICD-10-CM | POA: Diagnosis not present

## 2017-08-12 DIAGNOSIS — K219 Gastro-esophageal reflux disease without esophagitis: Secondary | ICD-10-CM | POA: Diagnosis not present

## 2017-08-12 DIAGNOSIS — I10 Essential (primary) hypertension: Secondary | ICD-10-CM | POA: Diagnosis not present

## 2017-08-12 DIAGNOSIS — E43 Unspecified severe protein-calorie malnutrition: Secondary | ICD-10-CM | POA: Diagnosis not present

## 2017-08-12 DIAGNOSIS — I519 Heart disease, unspecified: Secondary | ICD-10-CM | POA: Diagnosis not present

## 2017-08-12 DIAGNOSIS — H409 Unspecified glaucoma: Secondary | ICD-10-CM | POA: Diagnosis not present

## 2017-08-19 DIAGNOSIS — E43 Unspecified severe protein-calorie malnutrition: Secondary | ICD-10-CM | POA: Diagnosis not present

## 2017-08-19 DIAGNOSIS — I10 Essential (primary) hypertension: Secondary | ICD-10-CM | POA: Diagnosis not present

## 2017-08-19 DIAGNOSIS — I519 Heart disease, unspecified: Secondary | ICD-10-CM | POA: Diagnosis not present

## 2017-08-19 DIAGNOSIS — K219 Gastro-esophageal reflux disease without esophagitis: Secondary | ICD-10-CM | POA: Diagnosis not present

## 2017-08-19 DIAGNOSIS — H409 Unspecified glaucoma: Secondary | ICD-10-CM | POA: Diagnosis not present

## 2017-08-19 DIAGNOSIS — E782 Mixed hyperlipidemia: Secondary | ICD-10-CM | POA: Diagnosis not present

## 2017-08-21 DIAGNOSIS — E43 Unspecified severe protein-calorie malnutrition: Secondary | ICD-10-CM | POA: Diagnosis not present

## 2017-08-21 DIAGNOSIS — H409 Unspecified glaucoma: Secondary | ICD-10-CM | POA: Diagnosis not present

## 2017-08-21 DIAGNOSIS — M1991 Primary osteoarthritis, unspecified site: Secondary | ICD-10-CM | POA: Diagnosis not present

## 2017-08-21 DIAGNOSIS — E782 Mixed hyperlipidemia: Secondary | ICD-10-CM | POA: Diagnosis not present

## 2017-08-21 DIAGNOSIS — I4891 Unspecified atrial fibrillation: Secondary | ICD-10-CM | POA: Diagnosis not present

## 2017-08-21 DIAGNOSIS — R630 Anorexia: Secondary | ICD-10-CM | POA: Diagnosis not present

## 2017-08-21 DIAGNOSIS — R531 Weakness: Secondary | ICD-10-CM | POA: Diagnosis not present

## 2017-08-21 DIAGNOSIS — I519 Heart disease, unspecified: Secondary | ICD-10-CM | POA: Diagnosis not present

## 2017-08-21 DIAGNOSIS — R0602 Shortness of breath: Secondary | ICD-10-CM | POA: Diagnosis not present

## 2017-08-21 DIAGNOSIS — K219 Gastro-esophageal reflux disease without esophagitis: Secondary | ICD-10-CM | POA: Diagnosis not present

## 2017-08-21 DIAGNOSIS — R42 Dizziness and giddiness: Secondary | ICD-10-CM | POA: Diagnosis not present

## 2017-08-21 DIAGNOSIS — I509 Heart failure, unspecified: Secondary | ICD-10-CM | POA: Diagnosis not present

## 2017-08-21 DIAGNOSIS — I10 Essential (primary) hypertension: Secondary | ICD-10-CM | POA: Diagnosis not present

## 2017-08-24 DIAGNOSIS — E782 Mixed hyperlipidemia: Secondary | ICD-10-CM | POA: Diagnosis not present

## 2017-08-24 DIAGNOSIS — H409 Unspecified glaucoma: Secondary | ICD-10-CM | POA: Diagnosis not present

## 2017-08-24 DIAGNOSIS — K219 Gastro-esophageal reflux disease without esophagitis: Secondary | ICD-10-CM | POA: Diagnosis not present

## 2017-08-24 DIAGNOSIS — E43 Unspecified severe protein-calorie malnutrition: Secondary | ICD-10-CM | POA: Diagnosis not present

## 2017-08-24 DIAGNOSIS — I10 Essential (primary) hypertension: Secondary | ICD-10-CM | POA: Diagnosis not present

## 2017-08-24 DIAGNOSIS — I519 Heart disease, unspecified: Secondary | ICD-10-CM | POA: Diagnosis not present

## 2017-08-25 DIAGNOSIS — K219 Gastro-esophageal reflux disease without esophagitis: Secondary | ICD-10-CM | POA: Diagnosis not present

## 2017-08-25 DIAGNOSIS — E782 Mixed hyperlipidemia: Secondary | ICD-10-CM | POA: Diagnosis not present

## 2017-08-25 DIAGNOSIS — I10 Essential (primary) hypertension: Secondary | ICD-10-CM | POA: Diagnosis not present

## 2017-08-25 DIAGNOSIS — E43 Unspecified severe protein-calorie malnutrition: Secondary | ICD-10-CM | POA: Diagnosis not present

## 2017-08-25 DIAGNOSIS — H409 Unspecified glaucoma: Secondary | ICD-10-CM | POA: Diagnosis not present

## 2017-08-25 DIAGNOSIS — I519 Heart disease, unspecified: Secondary | ICD-10-CM | POA: Diagnosis not present

## 2017-08-26 DIAGNOSIS — K219 Gastro-esophageal reflux disease without esophagitis: Secondary | ICD-10-CM | POA: Diagnosis not present

## 2017-08-26 DIAGNOSIS — E782 Mixed hyperlipidemia: Secondary | ICD-10-CM | POA: Diagnosis not present

## 2017-08-26 DIAGNOSIS — I10 Essential (primary) hypertension: Secondary | ICD-10-CM | POA: Diagnosis not present

## 2017-08-26 DIAGNOSIS — E43 Unspecified severe protein-calorie malnutrition: Secondary | ICD-10-CM | POA: Diagnosis not present

## 2017-08-26 DIAGNOSIS — H409 Unspecified glaucoma: Secondary | ICD-10-CM | POA: Diagnosis not present

## 2017-08-26 DIAGNOSIS — I519 Heart disease, unspecified: Secondary | ICD-10-CM | POA: Diagnosis not present

## 2017-09-01 DIAGNOSIS — I10 Essential (primary) hypertension: Secondary | ICD-10-CM | POA: Diagnosis not present

## 2017-09-01 DIAGNOSIS — E43 Unspecified severe protein-calorie malnutrition: Secondary | ICD-10-CM | POA: Diagnosis not present

## 2017-09-01 DIAGNOSIS — E782 Mixed hyperlipidemia: Secondary | ICD-10-CM | POA: Diagnosis not present

## 2017-09-01 DIAGNOSIS — I519 Heart disease, unspecified: Secondary | ICD-10-CM | POA: Diagnosis not present

## 2017-09-01 DIAGNOSIS — H409 Unspecified glaucoma: Secondary | ICD-10-CM | POA: Diagnosis not present

## 2017-09-01 DIAGNOSIS — K219 Gastro-esophageal reflux disease without esophagitis: Secondary | ICD-10-CM | POA: Diagnosis not present

## 2017-09-02 DIAGNOSIS — H409 Unspecified glaucoma: Secondary | ICD-10-CM | POA: Diagnosis not present

## 2017-09-02 DIAGNOSIS — E43 Unspecified severe protein-calorie malnutrition: Secondary | ICD-10-CM | POA: Diagnosis not present

## 2017-09-02 DIAGNOSIS — K219 Gastro-esophageal reflux disease without esophagitis: Secondary | ICD-10-CM | POA: Diagnosis not present

## 2017-09-02 DIAGNOSIS — I519 Heart disease, unspecified: Secondary | ICD-10-CM | POA: Diagnosis not present

## 2017-09-02 DIAGNOSIS — E782 Mixed hyperlipidemia: Secondary | ICD-10-CM | POA: Diagnosis not present

## 2017-09-02 DIAGNOSIS — I10 Essential (primary) hypertension: Secondary | ICD-10-CM | POA: Diagnosis not present

## 2017-09-07 DIAGNOSIS — I519 Heart disease, unspecified: Secondary | ICD-10-CM | POA: Diagnosis not present

## 2017-09-07 DIAGNOSIS — E782 Mixed hyperlipidemia: Secondary | ICD-10-CM | POA: Diagnosis not present

## 2017-09-07 DIAGNOSIS — K219 Gastro-esophageal reflux disease without esophagitis: Secondary | ICD-10-CM | POA: Diagnosis not present

## 2017-09-07 DIAGNOSIS — I10 Essential (primary) hypertension: Secondary | ICD-10-CM | POA: Diagnosis not present

## 2017-09-07 DIAGNOSIS — H409 Unspecified glaucoma: Secondary | ICD-10-CM | POA: Diagnosis not present

## 2017-09-07 DIAGNOSIS — E43 Unspecified severe protein-calorie malnutrition: Secondary | ICD-10-CM | POA: Diagnosis not present

## 2017-09-08 DIAGNOSIS — E782 Mixed hyperlipidemia: Secondary | ICD-10-CM | POA: Diagnosis not present

## 2017-09-08 DIAGNOSIS — I519 Heart disease, unspecified: Secondary | ICD-10-CM | POA: Diagnosis not present

## 2017-09-08 DIAGNOSIS — H409 Unspecified glaucoma: Secondary | ICD-10-CM | POA: Diagnosis not present

## 2017-09-08 DIAGNOSIS — K219 Gastro-esophageal reflux disease without esophagitis: Secondary | ICD-10-CM | POA: Diagnosis not present

## 2017-09-08 DIAGNOSIS — E43 Unspecified severe protein-calorie malnutrition: Secondary | ICD-10-CM | POA: Diagnosis not present

## 2017-09-08 DIAGNOSIS — I10 Essential (primary) hypertension: Secondary | ICD-10-CM | POA: Diagnosis not present

## 2017-09-09 DIAGNOSIS — E43 Unspecified severe protein-calorie malnutrition: Secondary | ICD-10-CM | POA: Diagnosis not present

## 2017-09-09 DIAGNOSIS — I10 Essential (primary) hypertension: Secondary | ICD-10-CM | POA: Diagnosis not present

## 2017-09-09 DIAGNOSIS — K219 Gastro-esophageal reflux disease without esophagitis: Secondary | ICD-10-CM | POA: Diagnosis not present

## 2017-09-09 DIAGNOSIS — E782 Mixed hyperlipidemia: Secondary | ICD-10-CM | POA: Diagnosis not present

## 2017-09-09 DIAGNOSIS — H409 Unspecified glaucoma: Secondary | ICD-10-CM | POA: Diagnosis not present

## 2017-09-09 DIAGNOSIS — I519 Heart disease, unspecified: Secondary | ICD-10-CM | POA: Diagnosis not present

## 2017-09-15 DIAGNOSIS — E782 Mixed hyperlipidemia: Secondary | ICD-10-CM | POA: Diagnosis not present

## 2017-09-15 DIAGNOSIS — I519 Heart disease, unspecified: Secondary | ICD-10-CM | POA: Diagnosis not present

## 2017-09-15 DIAGNOSIS — I10 Essential (primary) hypertension: Secondary | ICD-10-CM | POA: Diagnosis not present

## 2017-09-15 DIAGNOSIS — K219 Gastro-esophageal reflux disease without esophagitis: Secondary | ICD-10-CM | POA: Diagnosis not present

## 2017-09-15 DIAGNOSIS — H409 Unspecified glaucoma: Secondary | ICD-10-CM | POA: Diagnosis not present

## 2017-09-15 DIAGNOSIS — E43 Unspecified severe protein-calorie malnutrition: Secondary | ICD-10-CM | POA: Diagnosis not present

## 2017-09-16 DIAGNOSIS — I519 Heart disease, unspecified: Secondary | ICD-10-CM | POA: Diagnosis not present

## 2017-09-16 DIAGNOSIS — E43 Unspecified severe protein-calorie malnutrition: Secondary | ICD-10-CM | POA: Diagnosis not present

## 2017-09-16 DIAGNOSIS — I10 Essential (primary) hypertension: Secondary | ICD-10-CM | POA: Diagnosis not present

## 2017-09-16 DIAGNOSIS — K219 Gastro-esophageal reflux disease without esophagitis: Secondary | ICD-10-CM | POA: Diagnosis not present

## 2017-09-16 DIAGNOSIS — E782 Mixed hyperlipidemia: Secondary | ICD-10-CM | POA: Diagnosis not present

## 2017-09-16 DIAGNOSIS — H409 Unspecified glaucoma: Secondary | ICD-10-CM | POA: Diagnosis not present

## 2017-09-20 DIAGNOSIS — M1991 Primary osteoarthritis, unspecified site: Secondary | ICD-10-CM | POA: Diagnosis not present

## 2017-09-20 DIAGNOSIS — R531 Weakness: Secondary | ICD-10-CM | POA: Diagnosis not present

## 2017-09-20 DIAGNOSIS — I519 Heart disease, unspecified: Secondary | ICD-10-CM | POA: Diagnosis not present

## 2017-09-20 DIAGNOSIS — H409 Unspecified glaucoma: Secondary | ICD-10-CM | POA: Diagnosis not present

## 2017-09-20 DIAGNOSIS — E43 Unspecified severe protein-calorie malnutrition: Secondary | ICD-10-CM | POA: Diagnosis not present

## 2017-09-20 DIAGNOSIS — I509 Heart failure, unspecified: Secondary | ICD-10-CM | POA: Diagnosis not present

## 2017-09-20 DIAGNOSIS — R42 Dizziness and giddiness: Secondary | ICD-10-CM | POA: Diagnosis not present

## 2017-09-20 DIAGNOSIS — R630 Anorexia: Secondary | ICD-10-CM | POA: Diagnosis not present

## 2017-09-20 DIAGNOSIS — R0602 Shortness of breath: Secondary | ICD-10-CM | POA: Diagnosis not present

## 2017-09-20 DIAGNOSIS — I4891 Unspecified atrial fibrillation: Secondary | ICD-10-CM | POA: Diagnosis not present

## 2017-09-20 DIAGNOSIS — I10 Essential (primary) hypertension: Secondary | ICD-10-CM | POA: Diagnosis not present

## 2017-09-20 DIAGNOSIS — K219 Gastro-esophageal reflux disease without esophagitis: Secondary | ICD-10-CM | POA: Diagnosis not present

## 2017-09-20 DIAGNOSIS — E782 Mixed hyperlipidemia: Secondary | ICD-10-CM | POA: Diagnosis not present

## 2017-09-22 DIAGNOSIS — K219 Gastro-esophageal reflux disease without esophagitis: Secondary | ICD-10-CM | POA: Diagnosis not present

## 2017-09-22 DIAGNOSIS — I519 Heart disease, unspecified: Secondary | ICD-10-CM | POA: Diagnosis not present

## 2017-09-22 DIAGNOSIS — H409 Unspecified glaucoma: Secondary | ICD-10-CM | POA: Diagnosis not present

## 2017-09-22 DIAGNOSIS — E782 Mixed hyperlipidemia: Secondary | ICD-10-CM | POA: Diagnosis not present

## 2017-09-22 DIAGNOSIS — I10 Essential (primary) hypertension: Secondary | ICD-10-CM | POA: Diagnosis not present

## 2017-09-22 DIAGNOSIS — E43 Unspecified severe protein-calorie malnutrition: Secondary | ICD-10-CM | POA: Diagnosis not present

## 2017-09-29 DIAGNOSIS — E43 Unspecified severe protein-calorie malnutrition: Secondary | ICD-10-CM | POA: Diagnosis not present

## 2017-09-29 DIAGNOSIS — I519 Heart disease, unspecified: Secondary | ICD-10-CM | POA: Diagnosis not present

## 2017-09-29 DIAGNOSIS — K219 Gastro-esophageal reflux disease without esophagitis: Secondary | ICD-10-CM | POA: Diagnosis not present

## 2017-09-29 DIAGNOSIS — I10 Essential (primary) hypertension: Secondary | ICD-10-CM | POA: Diagnosis not present

## 2017-09-29 DIAGNOSIS — H409 Unspecified glaucoma: Secondary | ICD-10-CM | POA: Diagnosis not present

## 2017-09-29 DIAGNOSIS — E782 Mixed hyperlipidemia: Secondary | ICD-10-CM | POA: Diagnosis not present

## 2017-09-30 DIAGNOSIS — I10 Essential (primary) hypertension: Secondary | ICD-10-CM | POA: Diagnosis not present

## 2017-09-30 DIAGNOSIS — K219 Gastro-esophageal reflux disease without esophagitis: Secondary | ICD-10-CM | POA: Diagnosis not present

## 2017-09-30 DIAGNOSIS — E43 Unspecified severe protein-calorie malnutrition: Secondary | ICD-10-CM | POA: Diagnosis not present

## 2017-09-30 DIAGNOSIS — E782 Mixed hyperlipidemia: Secondary | ICD-10-CM | POA: Diagnosis not present

## 2017-09-30 DIAGNOSIS — H409 Unspecified glaucoma: Secondary | ICD-10-CM | POA: Diagnosis not present

## 2017-09-30 DIAGNOSIS — I519 Heart disease, unspecified: Secondary | ICD-10-CM | POA: Diagnosis not present

## 2017-10-06 DIAGNOSIS — E43 Unspecified severe protein-calorie malnutrition: Secondary | ICD-10-CM | POA: Diagnosis not present

## 2017-10-06 DIAGNOSIS — I10 Essential (primary) hypertension: Secondary | ICD-10-CM | POA: Diagnosis not present

## 2017-10-06 DIAGNOSIS — I519 Heart disease, unspecified: Secondary | ICD-10-CM | POA: Diagnosis not present

## 2017-10-06 DIAGNOSIS — K219 Gastro-esophageal reflux disease without esophagitis: Secondary | ICD-10-CM | POA: Diagnosis not present

## 2017-10-06 DIAGNOSIS — H409 Unspecified glaucoma: Secondary | ICD-10-CM | POA: Diagnosis not present

## 2017-10-06 DIAGNOSIS — E782 Mixed hyperlipidemia: Secondary | ICD-10-CM | POA: Diagnosis not present

## 2017-10-08 DIAGNOSIS — I10 Essential (primary) hypertension: Secondary | ICD-10-CM | POA: Diagnosis not present

## 2017-10-08 DIAGNOSIS — I519 Heart disease, unspecified: Secondary | ICD-10-CM | POA: Diagnosis not present

## 2017-10-08 DIAGNOSIS — H409 Unspecified glaucoma: Secondary | ICD-10-CM | POA: Diagnosis not present

## 2017-10-08 DIAGNOSIS — E43 Unspecified severe protein-calorie malnutrition: Secondary | ICD-10-CM | POA: Diagnosis not present

## 2017-10-08 DIAGNOSIS — E782 Mixed hyperlipidemia: Secondary | ICD-10-CM | POA: Diagnosis not present

## 2017-10-08 DIAGNOSIS — K219 Gastro-esophageal reflux disease without esophagitis: Secondary | ICD-10-CM | POA: Diagnosis not present

## 2017-10-13 DIAGNOSIS — K219 Gastro-esophageal reflux disease without esophagitis: Secondary | ICD-10-CM | POA: Diagnosis not present

## 2017-10-13 DIAGNOSIS — I519 Heart disease, unspecified: Secondary | ICD-10-CM | POA: Diagnosis not present

## 2017-10-13 DIAGNOSIS — E43 Unspecified severe protein-calorie malnutrition: Secondary | ICD-10-CM | POA: Diagnosis not present

## 2017-10-13 DIAGNOSIS — H409 Unspecified glaucoma: Secondary | ICD-10-CM | POA: Diagnosis not present

## 2017-10-13 DIAGNOSIS — E782 Mixed hyperlipidemia: Secondary | ICD-10-CM | POA: Diagnosis not present

## 2017-10-13 DIAGNOSIS — I10 Essential (primary) hypertension: Secondary | ICD-10-CM | POA: Diagnosis not present

## 2017-10-20 DIAGNOSIS — E43 Unspecified severe protein-calorie malnutrition: Secondary | ICD-10-CM | POA: Diagnosis not present

## 2017-10-20 DIAGNOSIS — K219 Gastro-esophageal reflux disease without esophagitis: Secondary | ICD-10-CM | POA: Diagnosis not present

## 2017-10-20 DIAGNOSIS — I10 Essential (primary) hypertension: Secondary | ICD-10-CM | POA: Diagnosis not present

## 2017-10-20 DIAGNOSIS — H409 Unspecified glaucoma: Secondary | ICD-10-CM | POA: Diagnosis not present

## 2017-10-20 DIAGNOSIS — I519 Heart disease, unspecified: Secondary | ICD-10-CM | POA: Diagnosis not present

## 2017-10-20 DIAGNOSIS — E782 Mixed hyperlipidemia: Secondary | ICD-10-CM | POA: Diagnosis not present

## 2017-10-21 DIAGNOSIS — R531 Weakness: Secondary | ICD-10-CM | POA: Diagnosis not present

## 2017-10-21 DIAGNOSIS — R630 Anorexia: Secondary | ICD-10-CM | POA: Diagnosis not present

## 2017-10-21 DIAGNOSIS — I509 Heart failure, unspecified: Secondary | ICD-10-CM | POA: Diagnosis not present

## 2017-10-21 DIAGNOSIS — E43 Unspecified severe protein-calorie malnutrition: Secondary | ICD-10-CM | POA: Diagnosis not present

## 2017-10-21 DIAGNOSIS — K219 Gastro-esophageal reflux disease without esophagitis: Secondary | ICD-10-CM | POA: Diagnosis not present

## 2017-10-21 DIAGNOSIS — E782 Mixed hyperlipidemia: Secondary | ICD-10-CM | POA: Diagnosis not present

## 2017-10-21 DIAGNOSIS — I10 Essential (primary) hypertension: Secondary | ICD-10-CM | POA: Diagnosis not present

## 2017-10-21 DIAGNOSIS — H409 Unspecified glaucoma: Secondary | ICD-10-CM | POA: Diagnosis not present

## 2017-10-21 DIAGNOSIS — R0602 Shortness of breath: Secondary | ICD-10-CM | POA: Diagnosis not present

## 2017-10-21 DIAGNOSIS — M1991 Primary osteoarthritis, unspecified site: Secondary | ICD-10-CM | POA: Diagnosis not present

## 2017-10-21 DIAGNOSIS — R42 Dizziness and giddiness: Secondary | ICD-10-CM | POA: Diagnosis not present

## 2017-10-21 DIAGNOSIS — I4891 Unspecified atrial fibrillation: Secondary | ICD-10-CM | POA: Diagnosis not present

## 2017-10-21 DIAGNOSIS — I519 Heart disease, unspecified: Secondary | ICD-10-CM | POA: Diagnosis not present

## 2017-10-27 DIAGNOSIS — I519 Heart disease, unspecified: Secondary | ICD-10-CM | POA: Diagnosis not present

## 2017-10-27 DIAGNOSIS — H409 Unspecified glaucoma: Secondary | ICD-10-CM | POA: Diagnosis not present

## 2017-10-27 DIAGNOSIS — K219 Gastro-esophageal reflux disease without esophagitis: Secondary | ICD-10-CM | POA: Diagnosis not present

## 2017-10-27 DIAGNOSIS — I10 Essential (primary) hypertension: Secondary | ICD-10-CM | POA: Diagnosis not present

## 2017-10-27 DIAGNOSIS — E782 Mixed hyperlipidemia: Secondary | ICD-10-CM | POA: Diagnosis not present

## 2017-10-27 DIAGNOSIS — E43 Unspecified severe protein-calorie malnutrition: Secondary | ICD-10-CM | POA: Diagnosis not present

## 2017-10-28 DIAGNOSIS — E782 Mixed hyperlipidemia: Secondary | ICD-10-CM | POA: Diagnosis not present

## 2017-10-28 DIAGNOSIS — I10 Essential (primary) hypertension: Secondary | ICD-10-CM | POA: Diagnosis not present

## 2017-10-28 DIAGNOSIS — K219 Gastro-esophageal reflux disease without esophagitis: Secondary | ICD-10-CM | POA: Diagnosis not present

## 2017-10-28 DIAGNOSIS — E43 Unspecified severe protein-calorie malnutrition: Secondary | ICD-10-CM | POA: Diagnosis not present

## 2017-10-28 DIAGNOSIS — H409 Unspecified glaucoma: Secondary | ICD-10-CM | POA: Diagnosis not present

## 2017-10-28 DIAGNOSIS — I519 Heart disease, unspecified: Secondary | ICD-10-CM | POA: Diagnosis not present

## 2017-11-03 DIAGNOSIS — I10 Essential (primary) hypertension: Secondary | ICD-10-CM | POA: Diagnosis not present

## 2017-11-03 DIAGNOSIS — E782 Mixed hyperlipidemia: Secondary | ICD-10-CM | POA: Diagnosis not present

## 2017-11-03 DIAGNOSIS — E43 Unspecified severe protein-calorie malnutrition: Secondary | ICD-10-CM | POA: Diagnosis not present

## 2017-11-03 DIAGNOSIS — I519 Heart disease, unspecified: Secondary | ICD-10-CM | POA: Diagnosis not present

## 2017-11-03 DIAGNOSIS — H409 Unspecified glaucoma: Secondary | ICD-10-CM | POA: Diagnosis not present

## 2017-11-03 DIAGNOSIS — K219 Gastro-esophageal reflux disease without esophagitis: Secondary | ICD-10-CM | POA: Diagnosis not present

## 2017-11-05 DIAGNOSIS — E43 Unspecified severe protein-calorie malnutrition: Secondary | ICD-10-CM | POA: Diagnosis not present

## 2017-11-05 DIAGNOSIS — I10 Essential (primary) hypertension: Secondary | ICD-10-CM | POA: Diagnosis not present

## 2017-11-05 DIAGNOSIS — K219 Gastro-esophageal reflux disease without esophagitis: Secondary | ICD-10-CM | POA: Diagnosis not present

## 2017-11-05 DIAGNOSIS — H409 Unspecified glaucoma: Secondary | ICD-10-CM | POA: Diagnosis not present

## 2017-11-05 DIAGNOSIS — E782 Mixed hyperlipidemia: Secondary | ICD-10-CM | POA: Diagnosis not present

## 2017-11-05 DIAGNOSIS — I519 Heart disease, unspecified: Secondary | ICD-10-CM | POA: Diagnosis not present

## 2017-11-08 DIAGNOSIS — E782 Mixed hyperlipidemia: Secondary | ICD-10-CM | POA: Diagnosis not present

## 2017-11-08 DIAGNOSIS — K219 Gastro-esophageal reflux disease without esophagitis: Secondary | ICD-10-CM | POA: Diagnosis not present

## 2017-11-08 DIAGNOSIS — I10 Essential (primary) hypertension: Secondary | ICD-10-CM | POA: Diagnosis not present

## 2017-11-08 DIAGNOSIS — I519 Heart disease, unspecified: Secondary | ICD-10-CM | POA: Diagnosis not present

## 2017-11-08 DIAGNOSIS — H409 Unspecified glaucoma: Secondary | ICD-10-CM | POA: Diagnosis not present

## 2017-11-08 DIAGNOSIS — E43 Unspecified severe protein-calorie malnutrition: Secondary | ICD-10-CM | POA: Diagnosis not present

## 2017-11-09 DIAGNOSIS — K219 Gastro-esophageal reflux disease without esophagitis: Secondary | ICD-10-CM | POA: Diagnosis not present

## 2017-11-09 DIAGNOSIS — E782 Mixed hyperlipidemia: Secondary | ICD-10-CM | POA: Diagnosis not present

## 2017-11-09 DIAGNOSIS — E43 Unspecified severe protein-calorie malnutrition: Secondary | ICD-10-CM | POA: Diagnosis not present

## 2017-11-09 DIAGNOSIS — I10 Essential (primary) hypertension: Secondary | ICD-10-CM | POA: Diagnosis not present

## 2017-11-09 DIAGNOSIS — I519 Heart disease, unspecified: Secondary | ICD-10-CM | POA: Diagnosis not present

## 2017-11-09 DIAGNOSIS — H409 Unspecified glaucoma: Secondary | ICD-10-CM | POA: Diagnosis not present

## 2017-11-10 DIAGNOSIS — I10 Essential (primary) hypertension: Secondary | ICD-10-CM | POA: Diagnosis not present

## 2017-11-10 DIAGNOSIS — E43 Unspecified severe protein-calorie malnutrition: Secondary | ICD-10-CM | POA: Diagnosis not present

## 2017-11-10 DIAGNOSIS — E782 Mixed hyperlipidemia: Secondary | ICD-10-CM | POA: Diagnosis not present

## 2017-11-10 DIAGNOSIS — I519 Heart disease, unspecified: Secondary | ICD-10-CM | POA: Diagnosis not present

## 2017-11-10 DIAGNOSIS — H409 Unspecified glaucoma: Secondary | ICD-10-CM | POA: Diagnosis not present

## 2017-11-10 DIAGNOSIS — K219 Gastro-esophageal reflux disease without esophagitis: Secondary | ICD-10-CM | POA: Diagnosis not present

## 2017-11-11 DIAGNOSIS — H409 Unspecified glaucoma: Secondary | ICD-10-CM | POA: Diagnosis not present

## 2017-11-11 DIAGNOSIS — I519 Heart disease, unspecified: Secondary | ICD-10-CM | POA: Diagnosis not present

## 2017-11-11 DIAGNOSIS — E782 Mixed hyperlipidemia: Secondary | ICD-10-CM | POA: Diagnosis not present

## 2017-11-11 DIAGNOSIS — K219 Gastro-esophageal reflux disease without esophagitis: Secondary | ICD-10-CM | POA: Diagnosis not present

## 2017-11-11 DIAGNOSIS — I10 Essential (primary) hypertension: Secondary | ICD-10-CM | POA: Diagnosis not present

## 2017-11-11 DIAGNOSIS — E43 Unspecified severe protein-calorie malnutrition: Secondary | ICD-10-CM | POA: Diagnosis not present

## 2017-11-12 DIAGNOSIS — I10 Essential (primary) hypertension: Secondary | ICD-10-CM | POA: Diagnosis not present

## 2017-11-12 DIAGNOSIS — E43 Unspecified severe protein-calorie malnutrition: Secondary | ICD-10-CM | POA: Diagnosis not present

## 2017-11-12 DIAGNOSIS — H409 Unspecified glaucoma: Secondary | ICD-10-CM | POA: Diagnosis not present

## 2017-11-12 DIAGNOSIS — K219 Gastro-esophageal reflux disease without esophagitis: Secondary | ICD-10-CM | POA: Diagnosis not present

## 2017-11-12 DIAGNOSIS — I519 Heart disease, unspecified: Secondary | ICD-10-CM | POA: Diagnosis not present

## 2017-11-12 DIAGNOSIS — E782 Mixed hyperlipidemia: Secondary | ICD-10-CM | POA: Diagnosis not present

## 2017-11-13 DIAGNOSIS — K219 Gastro-esophageal reflux disease without esophagitis: Secondary | ICD-10-CM | POA: Diagnosis not present

## 2017-11-13 DIAGNOSIS — H409 Unspecified glaucoma: Secondary | ICD-10-CM | POA: Diagnosis not present

## 2017-11-13 DIAGNOSIS — I10 Essential (primary) hypertension: Secondary | ICD-10-CM | POA: Diagnosis not present

## 2017-11-13 DIAGNOSIS — E782 Mixed hyperlipidemia: Secondary | ICD-10-CM | POA: Diagnosis not present

## 2017-11-13 DIAGNOSIS — E43 Unspecified severe protein-calorie malnutrition: Secondary | ICD-10-CM | POA: Diagnosis not present

## 2017-11-13 DIAGNOSIS — I519 Heart disease, unspecified: Secondary | ICD-10-CM | POA: Diagnosis not present

## 2017-11-14 DIAGNOSIS — H409 Unspecified glaucoma: Secondary | ICD-10-CM | POA: Diagnosis not present

## 2017-11-14 DIAGNOSIS — K219 Gastro-esophageal reflux disease without esophagitis: Secondary | ICD-10-CM | POA: Diagnosis not present

## 2017-11-14 DIAGNOSIS — I10 Essential (primary) hypertension: Secondary | ICD-10-CM | POA: Diagnosis not present

## 2017-11-14 DIAGNOSIS — E43 Unspecified severe protein-calorie malnutrition: Secondary | ICD-10-CM | POA: Diagnosis not present

## 2017-11-14 DIAGNOSIS — I519 Heart disease, unspecified: Secondary | ICD-10-CM | POA: Diagnosis not present

## 2017-11-14 DIAGNOSIS — E782 Mixed hyperlipidemia: Secondary | ICD-10-CM | POA: Diagnosis not present

## 2017-11-15 DIAGNOSIS — E782 Mixed hyperlipidemia: Secondary | ICD-10-CM | POA: Diagnosis not present

## 2017-11-15 DIAGNOSIS — E43 Unspecified severe protein-calorie malnutrition: Secondary | ICD-10-CM | POA: Diagnosis not present

## 2017-11-15 DIAGNOSIS — K219 Gastro-esophageal reflux disease without esophagitis: Secondary | ICD-10-CM | POA: Diagnosis not present

## 2017-11-15 DIAGNOSIS — I10 Essential (primary) hypertension: Secondary | ICD-10-CM | POA: Diagnosis not present

## 2017-11-15 DIAGNOSIS — H409 Unspecified glaucoma: Secondary | ICD-10-CM | POA: Diagnosis not present

## 2017-11-15 DIAGNOSIS — I519 Heart disease, unspecified: Secondary | ICD-10-CM | POA: Diagnosis not present

## 2017-11-16 DIAGNOSIS — I10 Essential (primary) hypertension: Secondary | ICD-10-CM | POA: Diagnosis not present

## 2017-11-16 DIAGNOSIS — E782 Mixed hyperlipidemia: Secondary | ICD-10-CM | POA: Diagnosis not present

## 2017-11-16 DIAGNOSIS — I519 Heart disease, unspecified: Secondary | ICD-10-CM | POA: Diagnosis not present

## 2017-11-16 DIAGNOSIS — K219 Gastro-esophageal reflux disease without esophagitis: Secondary | ICD-10-CM | POA: Diagnosis not present

## 2017-11-16 DIAGNOSIS — E43 Unspecified severe protein-calorie malnutrition: Secondary | ICD-10-CM | POA: Diagnosis not present

## 2017-11-16 DIAGNOSIS — H409 Unspecified glaucoma: Secondary | ICD-10-CM | POA: Diagnosis not present

## 2017-11-17 DIAGNOSIS — I10 Essential (primary) hypertension: Secondary | ICD-10-CM | POA: Diagnosis not present

## 2017-11-17 DIAGNOSIS — E782 Mixed hyperlipidemia: Secondary | ICD-10-CM | POA: Diagnosis not present

## 2017-11-17 DIAGNOSIS — H409 Unspecified glaucoma: Secondary | ICD-10-CM | POA: Diagnosis not present

## 2017-11-17 DIAGNOSIS — K219 Gastro-esophageal reflux disease without esophagitis: Secondary | ICD-10-CM | POA: Diagnosis not present

## 2017-11-17 DIAGNOSIS — I519 Heart disease, unspecified: Secondary | ICD-10-CM | POA: Diagnosis not present

## 2017-11-17 DIAGNOSIS — E43 Unspecified severe protein-calorie malnutrition: Secondary | ICD-10-CM | POA: Diagnosis not present

## 2017-11-20 DIAGNOSIS — K219 Gastro-esophageal reflux disease without esophagitis: Secondary | ICD-10-CM | POA: Diagnosis not present

## 2017-11-20 DIAGNOSIS — E43 Unspecified severe protein-calorie malnutrition: Secondary | ICD-10-CM | POA: Diagnosis not present

## 2017-11-20 DIAGNOSIS — I10 Essential (primary) hypertension: Secondary | ICD-10-CM | POA: Diagnosis not present

## 2017-11-20 DIAGNOSIS — H409 Unspecified glaucoma: Secondary | ICD-10-CM | POA: Diagnosis not present

## 2017-11-20 DIAGNOSIS — E782 Mixed hyperlipidemia: Secondary | ICD-10-CM | POA: Diagnosis not present

## 2017-11-20 DIAGNOSIS — I519 Heart disease, unspecified: Secondary | ICD-10-CM | POA: Diagnosis not present

## 2017-11-21 DIAGNOSIS — R531 Weakness: Secondary | ICD-10-CM | POA: Diagnosis not present

## 2017-11-21 DIAGNOSIS — I519 Heart disease, unspecified: Secondary | ICD-10-CM | POA: Diagnosis not present

## 2017-11-21 DIAGNOSIS — K219 Gastro-esophageal reflux disease without esophagitis: Secondary | ICD-10-CM | POA: Diagnosis not present

## 2017-11-21 DIAGNOSIS — E43 Unspecified severe protein-calorie malnutrition: Secondary | ICD-10-CM | POA: Diagnosis not present

## 2017-11-21 DIAGNOSIS — I509 Heart failure, unspecified: Secondary | ICD-10-CM | POA: Diagnosis not present

## 2017-11-21 DIAGNOSIS — R0602 Shortness of breath: Secondary | ICD-10-CM | POA: Diagnosis not present

## 2017-11-21 DIAGNOSIS — I10 Essential (primary) hypertension: Secondary | ICD-10-CM | POA: Diagnosis not present

## 2017-11-21 DIAGNOSIS — R630 Anorexia: Secondary | ICD-10-CM | POA: Diagnosis not present

## 2017-11-21 DIAGNOSIS — E782 Mixed hyperlipidemia: Secondary | ICD-10-CM | POA: Diagnosis not present

## 2017-11-21 DIAGNOSIS — H409 Unspecified glaucoma: Secondary | ICD-10-CM | POA: Diagnosis not present

## 2017-11-21 DIAGNOSIS — R42 Dizziness and giddiness: Secondary | ICD-10-CM | POA: Diagnosis not present

## 2017-11-21 DIAGNOSIS — M1991 Primary osteoarthritis, unspecified site: Secondary | ICD-10-CM | POA: Diagnosis not present

## 2017-11-21 DIAGNOSIS — I4891 Unspecified atrial fibrillation: Secondary | ICD-10-CM | POA: Diagnosis not present

## 2017-11-24 DIAGNOSIS — E782 Mixed hyperlipidemia: Secondary | ICD-10-CM | POA: Diagnosis not present

## 2017-11-24 DIAGNOSIS — I10 Essential (primary) hypertension: Secondary | ICD-10-CM | POA: Diagnosis not present

## 2017-11-24 DIAGNOSIS — H409 Unspecified glaucoma: Secondary | ICD-10-CM | POA: Diagnosis not present

## 2017-11-24 DIAGNOSIS — K219 Gastro-esophageal reflux disease without esophagitis: Secondary | ICD-10-CM | POA: Diagnosis not present

## 2017-11-24 DIAGNOSIS — E43 Unspecified severe protein-calorie malnutrition: Secondary | ICD-10-CM | POA: Diagnosis not present

## 2017-11-24 DIAGNOSIS — I519 Heart disease, unspecified: Secondary | ICD-10-CM | POA: Diagnosis not present

## 2017-11-28 ENCOUNTER — Emergency Department (HOSPITAL_COMMUNITY): Payer: Medicare Other

## 2017-11-28 ENCOUNTER — Emergency Department (HOSPITAL_COMMUNITY)
Admission: EM | Admit: 2017-11-28 | Discharge: 2017-11-28 | Disposition: A | Discharge status: Home / Self Care | Payer: Medicare Other | Attending: Emergency Medicine | Admitting: Emergency Medicine

## 2017-11-28 ENCOUNTER — Encounter (HOSPITAL_COMMUNITY): Payer: Self-pay

## 2017-11-28 DIAGNOSIS — K219 Gastro-esophageal reflux disease without esophagitis: Secondary | ICD-10-CM | POA: Diagnosis not present

## 2017-11-28 DIAGNOSIS — Z79899 Other long term (current) drug therapy: Secondary | ICD-10-CM | POA: Insufficient documentation

## 2017-11-28 DIAGNOSIS — E782 Mixed hyperlipidemia: Secondary | ICD-10-CM | POA: Diagnosis not present

## 2017-11-28 DIAGNOSIS — R079 Chest pain, unspecified: Secondary | ICD-10-CM | POA: Diagnosis not present

## 2017-11-28 DIAGNOSIS — R1010 Upper abdominal pain, unspecified: Secondary | ICD-10-CM | POA: Diagnosis not present

## 2017-11-28 DIAGNOSIS — H409 Unspecified glaucoma: Secondary | ICD-10-CM | POA: Diagnosis not present

## 2017-11-28 DIAGNOSIS — R109 Unspecified abdominal pain: Secondary | ICD-10-CM | POA: Diagnosis not present

## 2017-11-28 DIAGNOSIS — I110 Hypertensive heart disease with heart failure: Secondary | ICD-10-CM | POA: Insufficient documentation

## 2017-11-28 DIAGNOSIS — I5022 Chronic systolic (congestive) heart failure: Secondary | ICD-10-CM | POA: Insufficient documentation

## 2017-11-28 DIAGNOSIS — I252 Old myocardial infarction: Secondary | ICD-10-CM | POA: Diagnosis not present

## 2017-11-28 DIAGNOSIS — E43 Unspecified severe protein-calorie malnutrition: Secondary | ICD-10-CM | POA: Diagnosis not present

## 2017-11-28 DIAGNOSIS — E039 Hypothyroidism, unspecified: Secondary | ICD-10-CM | POA: Insufficient documentation

## 2017-11-28 DIAGNOSIS — I519 Heart disease, unspecified: Secondary | ICD-10-CM | POA: Diagnosis not present

## 2017-11-28 DIAGNOSIS — I10 Essential (primary) hypertension: Secondary | ICD-10-CM | POA: Diagnosis not present

## 2017-11-28 HISTORY — DX: Unspecified protein-calorie malnutrition: E46

## 2017-11-28 LAB — URINALYSIS, ROUTINE W REFLEX MICROSCOPIC
BILIRUBIN URINE: NEGATIVE
Glucose, UA: NEGATIVE mg/dL
Hgb urine dipstick: NEGATIVE
Ketones, ur: NEGATIVE mg/dL
LEUKOCYTES UA: NEGATIVE
NITRITE: NEGATIVE
Protein, ur: NEGATIVE mg/dL
SPECIFIC GRAVITY, URINE: 1.005 (ref 1.005–1.030)
pH: 6 (ref 5.0–8.0)

## 2017-11-28 LAB — CBC
HCT: 41.4 % (ref 36.0–46.0)
HEMOGLOBIN: 13.8 g/dL (ref 12.0–15.0)
MCH: 31.4 pg (ref 26.0–34.0)
MCHC: 33.3 g/dL (ref 30.0–36.0)
MCV: 94.1 fL (ref 78.0–100.0)
Platelets: 188 10*3/uL (ref 150–400)
RBC: 4.4 MIL/uL (ref 3.87–5.11)
RDW: 13.8 % (ref 11.5–15.5)
WBC: 5 10*3/uL (ref 4.0–10.5)

## 2017-11-28 LAB — BASIC METABOLIC PANEL
ANION GAP: 7 (ref 5–15)
BUN: 11 mg/dL (ref 8–23)
CALCIUM: 8.5 mg/dL — AB (ref 8.9–10.3)
CHLORIDE: 109 mmol/L (ref 98–111)
CO2: 25 mmol/L (ref 22–32)
Creatinine, Ser: 0.53 mg/dL (ref 0.44–1.00)
GFR calc Af Amer: >60 mL/min (ref >60)
GFR calc non Af Amer: >60 mL/min (ref >60)
Glucose, Bld: 137 mg/dL — ABNORMAL HIGH (ref 70–99)
Potassium: 3.2 mmol/L — ABNORMAL LOW (ref 3.5–5.1)
Sodium: 141 mmol/L (ref 135–145)

## 2017-11-28 LAB — PROTIME-INR
INR: 1.28
Prothrombin Time: 15.8 seconds — ABNORMAL HIGH (ref 11.4–15.2)

## 2017-11-28 LAB — TROPONIN I

## 2017-11-28 NOTE — ED Provider Notes (Addendum)
[facility] EMERGENCY DEPARTMENT Provider Note   CSN: [id number] Arrival date & time: 11/28/17  1601     History   Chief Complaint Chief Complaint  Patient presents with  . Chest Pain  . Abdominal Pain    HPI Brittany Wallace is a 84 y.o. female. Level 5 caveat due to dementia HPI Patient presents with mid chest pain.  Dull.  Unsure when it started.  Unsure of anything makes it better or worse.  States she does not really think about pain.  Reported that the nursing home had said she had a low to moderate grade fever but unsure what the number was. Also with lower abdominal pain.  Has had some constipation.  Also cannot tell me really when the pain started or how long this been going on for him.  Denies dysuria. Past Medical History:  Diagnosis Date  . Aortic stenosis   . Hyperlipidemia   . Hypertension   . Hypothyroidism   . LBBB (left bundle branch block)   . Myocardial infarct ([place])    [id number]  . Nonischemic cardiomyopathy ([place])   . Protein malnutrition [facility])     Patient Active Problem List   Diagnosis Date Noted  . Hypothyroidism 11/29/2015  . Malnutrition of moderate degree 11/26/2015  . Palliative care encounter   . Goals of care, counseling/discussion   . DNR (do not resuscitate) discussion   . Acute exacerbation of CHF (congestive heart failure) ([place]) 11/25/2015  . Systolic CHF, acute on chronic ([place]) 11/25/2015  . Aortic valve disease 08/25/2015  . GERD (gastroesophageal reflux disease) 12/14/2013  . Nausea alone 11/13/2013  . Essential hypertension 12/28/2012  . Hyperlipidemia 12/28/2012  . Non-ischemic cardiomyopathy ([place]) 12/28/2012  . LBBB (left bundle branch block) 12/28/2012    Past Surgical History:  Procedure Laterality Date  . ABDOMINAL HYSTERECTOMY    . CHOLECYSTECTOMY    . EYE SURGERY       OB History   None      Home Medications    Prior to Admission medications   Medication Sig Start Date End Date Taking? Authorizing Provider    ALPRAZolam (XANAX) 0.25 MG tablet Take 0.25 mg by mouth every 6 (six) hours as needed for anxiety.   Yes [provider]  ALPRAZolam Duanne Moron) 0.5 MG tablet Take 0.5 mg by mouth at bedtime.   Yes [provider]  amLODipine (NORVASC) 5 MG tablet TAKE (1) TABLET BY MOUTH ONCE DAILY. 03/03/16  Yes Troy Sine, MD  mirtazapine (REMERON) 15 MG tablet Take 15 mg by mouth at bedtime.   Yes [provider]  omeprazole (PRILOSEC) 20 MG capsule Take 20 mg by mouth daily.   Yes [provider]  timolol (BETIMOL) 0.5 % ophthalmic solution Place 1 drop into both eyes 2 (two) times daily.   Yes [provider]  carvedilol (COREG) 3.125 MG tablet TAKE (1) TABLET BY MOUTH TWICE DAILY. 07/20/16   Troy Sine, MD  furosemide (LASIX) 20 MG tablet Take 0.5 tablets (10 mg total) by mouth daily. 08/23/15   Troy Sine, MD  lansoprazole (PREVACID) 30 MG capsule TAKE 1 CAPSULE 2 TIMES DAILY BEFORE A MEAL AND OTHER MEDICATIONS. 07/21/16   Rogene [place], MD    Family History Family History  Problem Relation Age of Onset  . Heart attack Father   . Hypertension Brother   . Cancer Sister     Social History Social History   Tobacco Use  . Smoking status: Never Smoker  .  Smokeless tobacco: Never Used  Substance Use Topics  . Alcohol use: No    Alcohol/week: 0.0 standard drinks  . Drug use: No     Allergies   Aspirin; Levaquin [levofloxacin in d5w]; and Pantoprazole   Review of Systems Review of Systems  Unable to perform ROS: Dementia     Physical Exam Updated Vital Signs BP (!) 165/69   Pulse 74   Temp 97.9 F (36.6 C) (Oral)   Resp (!) 34   Wt 45.8 kg   SpO2 98%   BMI 17.33 kg/m   Physical Exam  Constitutional: She appears well-developed.  HENT:  Head: Atraumatic.  Cardiovascular: Regular rhythm.  Pulmonary/Chest: Effort normal.  Abdominal:  Mild upper abdominal tenderness without rebound or guarding.  Neurological: She is  alert.  Mild confusion  Skin: Skin is warm. Capillary refill takes less than 2 seconds.     ED Treatments / Results  Labs (all labs ordered are listed, but only abnormal results are displayed) Labs Reviewed  BASIC METABOLIC PANEL - Abnormal; Notable for the following components:      Result Value   Potassium 3.2 (*)    Glucose, Bld 137 (*)    Calcium 8.5 (*)    All other components within normal limits  PROTIME-INR - Abnormal; Notable for the following components:   Prothrombin Time 15.8 (*)    All other components within normal limits  URINALYSIS, ROUTINE W REFLEX MICROSCOPIC - Abnormal; Notable for the following components:   Color, Urine STRAW (*)    All other components within normal limits  CBC  TROPONIN I    EKG EKG Interpretation  Date/Time:  Sunday November 28 2017 16:16:19 EDT Ventricular Rate:  71 PR Interval:    QRS Duration: 160 QT Interval:  454 QTC Calculation: 494 R Axis:   43 Text Interpretation:  Sinus rhythm Probable left atrial enlargement Left bundle branch block Confirmed by Davonna Belling [phone]) on 11/28/2017 4:18:51 PM   Radiology Dg Chest 2 View  Result Date: 11/28/2017 CLINICAL DATA:  Chest and lower abdominal pain. EXAM: CHEST - 2 VIEW COMPARISON:  11/25/2015. Chest CT dated 11/17/2005. FINDINGS: Stable enlarged cardiac silhouette. Diffuse enlargement and tortuosity of the thoracic aorta, similar to the CT dated 06/17/2005. Clear lungs with normal vascularity. Thoracic spine and right shoulder degenerative changes. IMPRESSION: 1. Diffusely enlarged and tortuous thoracic aorta, without significant change since 06/17/2005. This is difficult to compare to 11/25/2015 due to changes of congestive heart failure at that time obscuring the aortic margins. 2. No acute abnormality. Electronically Signed   By: Claudie Revering M.D.   On: 11/28/2017 17:08    Procedures Procedures (including critical care time)  Medications Ordered in ED Medications - No  data to display   Initial Impression / Assessment and Plan / ED Course  I have reviewed the triage vital signs and the nursing notes.  Pertinent labs & imaging results that were available during my care of the patient were reviewed by me and considered in my medical decision making (see chart for details).     Patient presents with chest pain abdominal pain and some generalized weakness.  84 year old and reportedly wants to die.  Has been in hospice but has basically stopped taking all her medicines but is no longer in hospice.  Work-up is reassuring.  There is some widening of her aorta and has known aortic stenosis but discussed with patient and she would not want any intervention if there was aortic pathology or most  other pathology.  Discussed with family member and her and will really not do any further work-up.  Discharge back home to nursing home.  Final Clinical Impressions(s) / ED Diagnoses   Final diagnoses:  Chest pain, unspecified type  Abdominal pain, unspecified abdominal location    ED Discharge Orders    None       Davonna Belling, MD 11/28/17 Drema Halon    Davonna Belling, MD 12/15/17 [phone]

## 2017-11-28 NOTE — ED Notes (Signed)
Purewick placed on pt. 

## 2017-11-28 NOTE — ED Triage Notes (Signed)
Pt reports that she began burning her chest before lunch and reports of lower abdominal. Feels shaky. Symptoms began today. Pt brought in from [facility]

## 2017-11-28 NOTE — ED Notes (Signed)
Pt reports that she is feeling better and how long before she can go  Per niece, pt was at [facility] and they called hospice to see if patient wanted to come to hospital  Hospice came out and asked if pt wanted to go to the hospital  She said she did and came here

## 2017-11-29 DIAGNOSIS — K219 Gastro-esophageal reflux disease without esophagitis: Secondary | ICD-10-CM | POA: Diagnosis not present

## 2017-11-29 DIAGNOSIS — E782 Mixed hyperlipidemia: Secondary | ICD-10-CM | POA: Diagnosis not present

## 2017-11-29 DIAGNOSIS — E43 Unspecified severe protein-calorie malnutrition: Secondary | ICD-10-CM | POA: Diagnosis not present

## 2017-11-29 DIAGNOSIS — H409 Unspecified glaucoma: Secondary | ICD-10-CM | POA: Diagnosis not present

## 2017-11-29 DIAGNOSIS — I519 Heart disease, unspecified: Secondary | ICD-10-CM | POA: Diagnosis not present

## 2017-11-29 DIAGNOSIS — I10 Essential (primary) hypertension: Secondary | ICD-10-CM | POA: Diagnosis not present

## 2017-12-01 DIAGNOSIS — I519 Heart disease, unspecified: Secondary | ICD-10-CM | POA: Diagnosis not present

## 2017-12-01 DIAGNOSIS — H409 Unspecified glaucoma: Secondary | ICD-10-CM | POA: Diagnosis not present

## 2017-12-01 DIAGNOSIS — K219 Gastro-esophageal reflux disease without esophagitis: Secondary | ICD-10-CM | POA: Diagnosis not present

## 2017-12-01 DIAGNOSIS — E782 Mixed hyperlipidemia: Secondary | ICD-10-CM | POA: Diagnosis not present

## 2017-12-01 DIAGNOSIS — E43 Unspecified severe protein-calorie malnutrition: Secondary | ICD-10-CM | POA: Diagnosis not present

## 2017-12-01 DIAGNOSIS — I10 Essential (primary) hypertension: Secondary | ICD-10-CM | POA: Diagnosis not present

## 2017-12-09 DIAGNOSIS — I519 Heart disease, unspecified: Secondary | ICD-10-CM | POA: Diagnosis not present

## 2017-12-09 DIAGNOSIS — E782 Mixed hyperlipidemia: Secondary | ICD-10-CM | POA: Diagnosis not present

## 2017-12-09 DIAGNOSIS — K219 Gastro-esophageal reflux disease without esophagitis: Secondary | ICD-10-CM | POA: Diagnosis not present

## 2017-12-09 DIAGNOSIS — I10 Essential (primary) hypertension: Secondary | ICD-10-CM | POA: Diagnosis not present

## 2017-12-09 DIAGNOSIS — H409 Unspecified glaucoma: Secondary | ICD-10-CM | POA: Diagnosis not present

## 2017-12-09 DIAGNOSIS — E43 Unspecified severe protein-calorie malnutrition: Secondary | ICD-10-CM | POA: Diagnosis not present

## 2017-12-13 DIAGNOSIS — H409 Unspecified glaucoma: Secondary | ICD-10-CM | POA: Diagnosis not present

## 2017-12-13 DIAGNOSIS — I519 Heart disease, unspecified: Secondary | ICD-10-CM | POA: Diagnosis not present

## 2017-12-13 DIAGNOSIS — I10 Essential (primary) hypertension: Secondary | ICD-10-CM | POA: Diagnosis not present

## 2017-12-13 DIAGNOSIS — K219 Gastro-esophageal reflux disease without esophagitis: Secondary | ICD-10-CM | POA: Diagnosis not present

## 2017-12-13 DIAGNOSIS — E43 Unspecified severe protein-calorie malnutrition: Secondary | ICD-10-CM | POA: Diagnosis not present

## 2017-12-13 DIAGNOSIS — E782 Mixed hyperlipidemia: Secondary | ICD-10-CM | POA: Diagnosis not present

## 2017-12-21 DIAGNOSIS — E43 Unspecified severe protein-calorie malnutrition: Secondary | ICD-10-CM | POA: Diagnosis not present

## 2017-12-21 DIAGNOSIS — R531 Weakness: Secondary | ICD-10-CM | POA: Diagnosis not present

## 2017-12-21 DIAGNOSIS — E782 Mixed hyperlipidemia: Secondary | ICD-10-CM | POA: Diagnosis not present

## 2017-12-21 DIAGNOSIS — I10 Essential (primary) hypertension: Secondary | ICD-10-CM | POA: Diagnosis not present

## 2017-12-21 DIAGNOSIS — K219 Gastro-esophageal reflux disease without esophagitis: Secondary | ICD-10-CM | POA: Diagnosis not present

## 2017-12-21 DIAGNOSIS — I519 Heart disease, unspecified: Secondary | ICD-10-CM | POA: Diagnosis not present

## 2017-12-21 DIAGNOSIS — M1991 Primary osteoarthritis, unspecified site: Secondary | ICD-10-CM | POA: Diagnosis not present

## 2017-12-21 DIAGNOSIS — R42 Dizziness and giddiness: Secondary | ICD-10-CM | POA: Diagnosis not present

## 2017-12-21 DIAGNOSIS — H409 Unspecified glaucoma: Secondary | ICD-10-CM | POA: Diagnosis not present

## 2017-12-21 DIAGNOSIS — R0602 Shortness of breath: Secondary | ICD-10-CM | POA: Diagnosis not present

## 2017-12-21 DIAGNOSIS — I4891 Unspecified atrial fibrillation: Secondary | ICD-10-CM | POA: Diagnosis not present

## 2017-12-21 DIAGNOSIS — I509 Heart failure, unspecified: Secondary | ICD-10-CM | POA: Diagnosis not present

## 2017-12-21 DIAGNOSIS — R630 Anorexia: Secondary | ICD-10-CM | POA: Diagnosis not present

## 2017-12-22 DIAGNOSIS — E43 Unspecified severe protein-calorie malnutrition: Secondary | ICD-10-CM | POA: Diagnosis not present

## 2017-12-22 DIAGNOSIS — I10 Essential (primary) hypertension: Secondary | ICD-10-CM | POA: Diagnosis not present

## 2017-12-22 DIAGNOSIS — K219 Gastro-esophageal reflux disease without esophagitis: Secondary | ICD-10-CM | POA: Diagnosis not present

## 2017-12-22 DIAGNOSIS — H409 Unspecified glaucoma: Secondary | ICD-10-CM | POA: Diagnosis not present

## 2017-12-22 DIAGNOSIS — E782 Mixed hyperlipidemia: Secondary | ICD-10-CM | POA: Diagnosis not present

## 2017-12-22 DIAGNOSIS — I519 Heart disease, unspecified: Secondary | ICD-10-CM | POA: Diagnosis not present

## 2017-12-23 DIAGNOSIS — E782 Mixed hyperlipidemia: Secondary | ICD-10-CM | POA: Diagnosis not present

## 2017-12-23 DIAGNOSIS — H409 Unspecified glaucoma: Secondary | ICD-10-CM | POA: Diagnosis not present

## 2017-12-23 DIAGNOSIS — K219 Gastro-esophageal reflux disease without esophagitis: Secondary | ICD-10-CM | POA: Diagnosis not present

## 2017-12-23 DIAGNOSIS — I519 Heart disease, unspecified: Secondary | ICD-10-CM | POA: Diagnosis not present

## 2017-12-23 DIAGNOSIS — E43 Unspecified severe protein-calorie malnutrition: Secondary | ICD-10-CM | POA: Diagnosis not present

## 2017-12-23 DIAGNOSIS — I10 Essential (primary) hypertension: Secondary | ICD-10-CM | POA: Diagnosis not present

## 2017-12-29 DIAGNOSIS — I10 Essential (primary) hypertension: Secondary | ICD-10-CM | POA: Diagnosis not present

## 2017-12-29 DIAGNOSIS — I519 Heart disease, unspecified: Secondary | ICD-10-CM | POA: Diagnosis not present

## 2017-12-29 DIAGNOSIS — E782 Mixed hyperlipidemia: Secondary | ICD-10-CM | POA: Diagnosis not present

## 2017-12-29 DIAGNOSIS — H409 Unspecified glaucoma: Secondary | ICD-10-CM | POA: Diagnosis not present

## 2017-12-29 DIAGNOSIS — K219 Gastro-esophageal reflux disease without esophagitis: Secondary | ICD-10-CM | POA: Diagnosis not present

## 2017-12-29 DIAGNOSIS — E43 Unspecified severe protein-calorie malnutrition: Secondary | ICD-10-CM | POA: Diagnosis not present

## 2017-12-30 DIAGNOSIS — K219 Gastro-esophageal reflux disease without esophagitis: Secondary | ICD-10-CM | POA: Diagnosis not present

## 2017-12-30 DIAGNOSIS — E782 Mixed hyperlipidemia: Secondary | ICD-10-CM | POA: Diagnosis not present

## 2017-12-30 DIAGNOSIS — I10 Essential (primary) hypertension: Secondary | ICD-10-CM | POA: Diagnosis not present

## 2017-12-30 DIAGNOSIS — H409 Unspecified glaucoma: Secondary | ICD-10-CM | POA: Diagnosis not present

## 2017-12-30 DIAGNOSIS — I519 Heart disease, unspecified: Secondary | ICD-10-CM | POA: Diagnosis not present

## 2017-12-30 DIAGNOSIS — E43 Unspecified severe protein-calorie malnutrition: Secondary | ICD-10-CM | POA: Diagnosis not present

## 2018-01-04 DIAGNOSIS — K219 Gastro-esophageal reflux disease without esophagitis: Secondary | ICD-10-CM | POA: Diagnosis not present

## 2018-01-04 DIAGNOSIS — I519 Heart disease, unspecified: Secondary | ICD-10-CM | POA: Diagnosis not present

## 2018-01-04 DIAGNOSIS — E43 Unspecified severe protein-calorie malnutrition: Secondary | ICD-10-CM | POA: Diagnosis not present

## 2018-01-04 DIAGNOSIS — I10 Essential (primary) hypertension: Secondary | ICD-10-CM | POA: Diagnosis not present

## 2018-01-04 DIAGNOSIS — H409 Unspecified glaucoma: Secondary | ICD-10-CM | POA: Diagnosis not present

## 2018-01-04 DIAGNOSIS — E782 Mixed hyperlipidemia: Secondary | ICD-10-CM | POA: Diagnosis not present

## 2018-01-11 DIAGNOSIS — I10 Essential (primary) hypertension: Secondary | ICD-10-CM | POA: Diagnosis not present

## 2018-01-11 DIAGNOSIS — E43 Unspecified severe protein-calorie malnutrition: Secondary | ICD-10-CM | POA: Diagnosis not present

## 2018-01-11 DIAGNOSIS — H409 Unspecified glaucoma: Secondary | ICD-10-CM | POA: Diagnosis not present

## 2018-01-11 DIAGNOSIS — E782 Mixed hyperlipidemia: Secondary | ICD-10-CM | POA: Diagnosis not present

## 2018-01-11 DIAGNOSIS — I519 Heart disease, unspecified: Secondary | ICD-10-CM | POA: Diagnosis not present

## 2018-01-11 DIAGNOSIS — K219 Gastro-esophageal reflux disease without esophagitis: Secondary | ICD-10-CM | POA: Diagnosis not present

## 2018-01-19 DIAGNOSIS — E43 Unspecified severe protein-calorie malnutrition: Secondary | ICD-10-CM | POA: Diagnosis not present

## 2018-01-19 DIAGNOSIS — I519 Heart disease, unspecified: Secondary | ICD-10-CM | POA: Diagnosis not present

## 2018-01-19 DIAGNOSIS — E782 Mixed hyperlipidemia: Secondary | ICD-10-CM | POA: Diagnosis not present

## 2018-01-19 DIAGNOSIS — K219 Gastro-esophageal reflux disease without esophagitis: Secondary | ICD-10-CM | POA: Diagnosis not present

## 2018-01-19 DIAGNOSIS — I10 Essential (primary) hypertension: Secondary | ICD-10-CM | POA: Diagnosis not present

## 2018-01-19 DIAGNOSIS — H409 Unspecified glaucoma: Secondary | ICD-10-CM | POA: Diagnosis not present

## 2018-01-21 DIAGNOSIS — R42 Dizziness and giddiness: Secondary | ICD-10-CM | POA: Diagnosis not present

## 2018-01-21 DIAGNOSIS — K219 Gastro-esophageal reflux disease without esophagitis: Secondary | ICD-10-CM | POA: Diagnosis not present

## 2018-01-21 DIAGNOSIS — I4891 Unspecified atrial fibrillation: Secondary | ICD-10-CM | POA: Diagnosis not present

## 2018-01-21 DIAGNOSIS — R630 Anorexia: Secondary | ICD-10-CM | POA: Diagnosis not present

## 2018-01-21 DIAGNOSIS — R531 Weakness: Secondary | ICD-10-CM | POA: Diagnosis not present

## 2018-01-21 DIAGNOSIS — I10 Essential (primary) hypertension: Secondary | ICD-10-CM | POA: Diagnosis not present

## 2018-01-21 DIAGNOSIS — R0602 Shortness of breath: Secondary | ICD-10-CM | POA: Diagnosis not present

## 2018-01-21 DIAGNOSIS — I519 Heart disease, unspecified: Secondary | ICD-10-CM | POA: Diagnosis not present

## 2018-01-21 DIAGNOSIS — E782 Mixed hyperlipidemia: Secondary | ICD-10-CM | POA: Diagnosis not present

## 2018-01-21 DIAGNOSIS — I509 Heart failure, unspecified: Secondary | ICD-10-CM | POA: Diagnosis not present

## 2018-01-21 DIAGNOSIS — M1991 Primary osteoarthritis, unspecified site: Secondary | ICD-10-CM | POA: Diagnosis not present

## 2018-01-21 DIAGNOSIS — E43 Unspecified severe protein-calorie malnutrition: Secondary | ICD-10-CM | POA: Diagnosis not present

## 2018-01-21 DIAGNOSIS — H409 Unspecified glaucoma: Secondary | ICD-10-CM | POA: Diagnosis not present

## 2018-01-26 DIAGNOSIS — E43 Unspecified severe protein-calorie malnutrition: Secondary | ICD-10-CM | POA: Diagnosis not present

## 2018-01-26 DIAGNOSIS — I10 Essential (primary) hypertension: Secondary | ICD-10-CM | POA: Diagnosis not present

## 2018-01-26 DIAGNOSIS — E782 Mixed hyperlipidemia: Secondary | ICD-10-CM | POA: Diagnosis not present

## 2018-01-26 DIAGNOSIS — I519 Heart disease, unspecified: Secondary | ICD-10-CM | POA: Diagnosis not present

## 2018-01-26 DIAGNOSIS — K219 Gastro-esophageal reflux disease without esophagitis: Secondary | ICD-10-CM | POA: Diagnosis not present

## 2018-01-26 DIAGNOSIS — H409 Unspecified glaucoma: Secondary | ICD-10-CM | POA: Diagnosis not present

## 2018-02-03 DIAGNOSIS — E43 Unspecified severe protein-calorie malnutrition: Secondary | ICD-10-CM | POA: Diagnosis not present

## 2018-02-03 DIAGNOSIS — I519 Heart disease, unspecified: Secondary | ICD-10-CM | POA: Diagnosis not present

## 2018-02-03 DIAGNOSIS — K219 Gastro-esophageal reflux disease without esophagitis: Secondary | ICD-10-CM | POA: Diagnosis not present

## 2018-02-03 DIAGNOSIS — E782 Mixed hyperlipidemia: Secondary | ICD-10-CM | POA: Diagnosis not present

## 2018-02-03 DIAGNOSIS — H409 Unspecified glaucoma: Secondary | ICD-10-CM | POA: Diagnosis not present

## 2018-02-03 DIAGNOSIS — I10 Essential (primary) hypertension: Secondary | ICD-10-CM | POA: Diagnosis not present

## 2018-02-07 DIAGNOSIS — E43 Unspecified severe protein-calorie malnutrition: Secondary | ICD-10-CM | POA: Diagnosis not present

## 2018-02-07 DIAGNOSIS — I10 Essential (primary) hypertension: Secondary | ICD-10-CM | POA: Diagnosis not present

## 2018-02-07 DIAGNOSIS — K219 Gastro-esophageal reflux disease without esophagitis: Secondary | ICD-10-CM | POA: Diagnosis not present

## 2018-02-07 DIAGNOSIS — I519 Heart disease, unspecified: Secondary | ICD-10-CM | POA: Diagnosis not present

## 2018-02-07 DIAGNOSIS — H409 Unspecified glaucoma: Secondary | ICD-10-CM | POA: Diagnosis not present

## 2018-02-07 DIAGNOSIS — E782 Mixed hyperlipidemia: Secondary | ICD-10-CM | POA: Diagnosis not present

## 2018-02-10 DIAGNOSIS — E782 Mixed hyperlipidemia: Secondary | ICD-10-CM | POA: Diagnosis not present

## 2018-02-10 DIAGNOSIS — H409 Unspecified glaucoma: Secondary | ICD-10-CM | POA: Diagnosis not present

## 2018-02-10 DIAGNOSIS — I10 Essential (primary) hypertension: Secondary | ICD-10-CM | POA: Diagnosis not present

## 2018-02-10 DIAGNOSIS — E43 Unspecified severe protein-calorie malnutrition: Secondary | ICD-10-CM | POA: Diagnosis not present

## 2018-02-10 DIAGNOSIS — I519 Heart disease, unspecified: Secondary | ICD-10-CM | POA: Diagnosis not present

## 2018-02-10 DIAGNOSIS — K219 Gastro-esophageal reflux disease without esophagitis: Secondary | ICD-10-CM | POA: Diagnosis not present

## 2018-02-15 DIAGNOSIS — E782 Mixed hyperlipidemia: Secondary | ICD-10-CM | POA: Diagnosis not present

## 2018-02-15 DIAGNOSIS — E43 Unspecified severe protein-calorie malnutrition: Secondary | ICD-10-CM | POA: Diagnosis not present

## 2018-02-15 DIAGNOSIS — I519 Heart disease, unspecified: Secondary | ICD-10-CM | POA: Diagnosis not present

## 2018-02-15 DIAGNOSIS — K219 Gastro-esophageal reflux disease without esophagitis: Secondary | ICD-10-CM | POA: Diagnosis not present

## 2018-02-15 DIAGNOSIS — H409 Unspecified glaucoma: Secondary | ICD-10-CM | POA: Diagnosis not present

## 2018-02-15 DIAGNOSIS — I10 Essential (primary) hypertension: Secondary | ICD-10-CM | POA: Diagnosis not present

## 2018-02-18 DIAGNOSIS — I519 Heart disease, unspecified: Secondary | ICD-10-CM | POA: Diagnosis not present

## 2018-02-18 DIAGNOSIS — E43 Unspecified severe protein-calorie malnutrition: Secondary | ICD-10-CM | POA: Diagnosis not present

## 2018-02-18 DIAGNOSIS — K219 Gastro-esophageal reflux disease without esophagitis: Secondary | ICD-10-CM | POA: Diagnosis not present

## 2018-02-18 DIAGNOSIS — I10 Essential (primary) hypertension: Secondary | ICD-10-CM | POA: Diagnosis not present

## 2018-02-18 DIAGNOSIS — E782 Mixed hyperlipidemia: Secondary | ICD-10-CM | POA: Diagnosis not present

## 2018-02-18 DIAGNOSIS — H409 Unspecified glaucoma: Secondary | ICD-10-CM | POA: Diagnosis not present

## 2018-02-20 DIAGNOSIS — M1991 Primary osteoarthritis, unspecified site: Secondary | ICD-10-CM | POA: Diagnosis not present

## 2018-02-20 DIAGNOSIS — E782 Mixed hyperlipidemia: Secondary | ICD-10-CM | POA: Diagnosis not present

## 2018-02-20 DIAGNOSIS — R531 Weakness: Secondary | ICD-10-CM | POA: Diagnosis not present

## 2018-02-20 DIAGNOSIS — I4891 Unspecified atrial fibrillation: Secondary | ICD-10-CM | POA: Diagnosis not present

## 2018-02-20 DIAGNOSIS — R42 Dizziness and giddiness: Secondary | ICD-10-CM | POA: Diagnosis not present

## 2018-02-20 DIAGNOSIS — K219 Gastro-esophageal reflux disease without esophagitis: Secondary | ICD-10-CM | POA: Diagnosis not present

## 2018-02-20 DIAGNOSIS — R0602 Shortness of breath: Secondary | ICD-10-CM | POA: Diagnosis not present

## 2018-02-20 DIAGNOSIS — E43 Unspecified severe protein-calorie malnutrition: Secondary | ICD-10-CM | POA: Diagnosis not present

## 2018-02-20 DIAGNOSIS — I509 Heart failure, unspecified: Secondary | ICD-10-CM | POA: Diagnosis not present

## 2018-02-20 DIAGNOSIS — R630 Anorexia: Secondary | ICD-10-CM | POA: Diagnosis not present

## 2018-02-20 DIAGNOSIS — I10 Essential (primary) hypertension: Secondary | ICD-10-CM | POA: Diagnosis not present

## 2018-02-20 DIAGNOSIS — I519 Heart disease, unspecified: Secondary | ICD-10-CM | POA: Diagnosis not present

## 2018-02-20 DIAGNOSIS — H409 Unspecified glaucoma: Secondary | ICD-10-CM | POA: Diagnosis not present

## 2018-02-21 DIAGNOSIS — I10 Essential (primary) hypertension: Secondary | ICD-10-CM | POA: Diagnosis not present

## 2018-02-21 DIAGNOSIS — E43 Unspecified severe protein-calorie malnutrition: Secondary | ICD-10-CM | POA: Diagnosis not present

## 2018-02-21 DIAGNOSIS — H409 Unspecified glaucoma: Secondary | ICD-10-CM | POA: Diagnosis not present

## 2018-02-21 DIAGNOSIS — E782 Mixed hyperlipidemia: Secondary | ICD-10-CM | POA: Diagnosis not present

## 2018-02-21 DIAGNOSIS — I519 Heart disease, unspecified: Secondary | ICD-10-CM | POA: Diagnosis not present

## 2018-02-21 DIAGNOSIS — K219 Gastro-esophageal reflux disease without esophagitis: Secondary | ICD-10-CM | POA: Diagnosis not present

## 2018-02-22 DIAGNOSIS — B351 Tinea unguium: Secondary | ICD-10-CM | POA: Diagnosis not present

## 2018-02-22 DIAGNOSIS — M79674 Pain in right toe(s): Secondary | ICD-10-CM | POA: Diagnosis not present

## 2018-02-25 DIAGNOSIS — K219 Gastro-esophageal reflux disease without esophagitis: Secondary | ICD-10-CM | POA: Diagnosis not present

## 2018-02-25 DIAGNOSIS — E782 Mixed hyperlipidemia: Secondary | ICD-10-CM | POA: Diagnosis not present

## 2018-02-25 DIAGNOSIS — I519 Heart disease, unspecified: Secondary | ICD-10-CM | POA: Diagnosis not present

## 2018-02-25 DIAGNOSIS — E43 Unspecified severe protein-calorie malnutrition: Secondary | ICD-10-CM | POA: Diagnosis not present

## 2018-02-25 DIAGNOSIS — H409 Unspecified glaucoma: Secondary | ICD-10-CM | POA: Diagnosis not present

## 2018-02-25 DIAGNOSIS — I10 Essential (primary) hypertension: Secondary | ICD-10-CM | POA: Diagnosis not present

## 2018-03-02 DIAGNOSIS — I519 Heart disease, unspecified: Secondary | ICD-10-CM | POA: Diagnosis not present

## 2018-03-02 DIAGNOSIS — K219 Gastro-esophageal reflux disease without esophagitis: Secondary | ICD-10-CM | POA: Diagnosis not present

## 2018-03-02 DIAGNOSIS — E43 Unspecified severe protein-calorie malnutrition: Secondary | ICD-10-CM | POA: Diagnosis not present

## 2018-03-02 DIAGNOSIS — H409 Unspecified glaucoma: Secondary | ICD-10-CM | POA: Diagnosis not present

## 2018-03-02 DIAGNOSIS — I10 Essential (primary) hypertension: Secondary | ICD-10-CM | POA: Diagnosis not present

## 2018-03-02 DIAGNOSIS — E782 Mixed hyperlipidemia: Secondary | ICD-10-CM | POA: Diagnosis not present

## 2018-03-04 DIAGNOSIS — E782 Mixed hyperlipidemia: Secondary | ICD-10-CM | POA: Diagnosis not present

## 2018-03-04 DIAGNOSIS — H409 Unspecified glaucoma: Secondary | ICD-10-CM | POA: Diagnosis not present

## 2018-03-04 DIAGNOSIS — K219 Gastro-esophageal reflux disease without esophagitis: Secondary | ICD-10-CM | POA: Diagnosis not present

## 2018-03-04 DIAGNOSIS — E43 Unspecified severe protein-calorie malnutrition: Secondary | ICD-10-CM | POA: Diagnosis not present

## 2018-03-04 DIAGNOSIS — I10 Essential (primary) hypertension: Secondary | ICD-10-CM | POA: Diagnosis not present

## 2018-03-04 DIAGNOSIS — I519 Heart disease, unspecified: Secondary | ICD-10-CM | POA: Diagnosis not present

## 2018-03-07 DIAGNOSIS — E782 Mixed hyperlipidemia: Secondary | ICD-10-CM | POA: Diagnosis not present

## 2018-03-07 DIAGNOSIS — K219 Gastro-esophageal reflux disease without esophagitis: Secondary | ICD-10-CM | POA: Diagnosis not present

## 2018-03-07 DIAGNOSIS — H409 Unspecified glaucoma: Secondary | ICD-10-CM | POA: Diagnosis not present

## 2018-03-07 DIAGNOSIS — E43 Unspecified severe protein-calorie malnutrition: Secondary | ICD-10-CM | POA: Diagnosis not present

## 2018-03-07 DIAGNOSIS — I519 Heart disease, unspecified: Secondary | ICD-10-CM | POA: Diagnosis not present

## 2018-03-07 DIAGNOSIS — I10 Essential (primary) hypertension: Secondary | ICD-10-CM | POA: Diagnosis not present

## 2018-03-14 DIAGNOSIS — E43 Unspecified severe protein-calorie malnutrition: Secondary | ICD-10-CM | POA: Diagnosis not present

## 2018-03-14 DIAGNOSIS — K219 Gastro-esophageal reflux disease without esophagitis: Secondary | ICD-10-CM | POA: Diagnosis not present

## 2018-03-14 DIAGNOSIS — H409 Unspecified glaucoma: Secondary | ICD-10-CM | POA: Diagnosis not present

## 2018-03-14 DIAGNOSIS — E782 Mixed hyperlipidemia: Secondary | ICD-10-CM | POA: Diagnosis not present

## 2018-03-14 DIAGNOSIS — I10 Essential (primary) hypertension: Secondary | ICD-10-CM | POA: Diagnosis not present

## 2018-03-14 DIAGNOSIS — I519 Heart disease, unspecified: Secondary | ICD-10-CM | POA: Diagnosis not present

## 2018-03-23 DIAGNOSIS — K219 Gastro-esophageal reflux disease without esophagitis: Secondary | ICD-10-CM | POA: Diagnosis not present

## 2018-03-23 DIAGNOSIS — I10 Essential (primary) hypertension: Secondary | ICD-10-CM | POA: Diagnosis not present

## 2018-03-23 DIAGNOSIS — F329 Major depressive disorder, single episode, unspecified: Secondary | ICD-10-CM | POA: Diagnosis not present

## 2018-03-23 DIAGNOSIS — F419 Anxiety disorder, unspecified: Secondary | ICD-10-CM | POA: Diagnosis not present

## 2018-03-28 DIAGNOSIS — E782 Mixed hyperlipidemia: Secondary | ICD-10-CM | POA: Diagnosis not present

## 2018-03-28 DIAGNOSIS — E43 Unspecified severe protein-calorie malnutrition: Secondary | ICD-10-CM | POA: Diagnosis not present

## 2018-03-28 DIAGNOSIS — K219 Gastro-esophageal reflux disease without esophagitis: Secondary | ICD-10-CM | POA: Diagnosis not present

## 2018-03-28 DIAGNOSIS — I10 Essential (primary) hypertension: Secondary | ICD-10-CM | POA: Diagnosis not present

## 2018-03-30 DIAGNOSIS — D649 Anemia, unspecified: Secondary | ICD-10-CM | POA: Diagnosis not present

## 2018-03-30 DIAGNOSIS — Z79899 Other long term (current) drug therapy: Secondary | ICD-10-CM | POA: Diagnosis not present

## 2018-04-05 DIAGNOSIS — K219 Gastro-esophageal reflux disease without esophagitis: Secondary | ICD-10-CM | POA: Diagnosis not present

## 2018-04-05 DIAGNOSIS — I10 Essential (primary) hypertension: Secondary | ICD-10-CM | POA: Diagnosis not present

## 2018-04-05 DIAGNOSIS — E43 Unspecified severe protein-calorie malnutrition: Secondary | ICD-10-CM | POA: Diagnosis not present

## 2018-04-13 DIAGNOSIS — K219 Gastro-esophageal reflux disease without esophagitis: Secondary | ICD-10-CM | POA: Diagnosis not present

## 2018-04-13 DIAGNOSIS — F419 Anxiety disorder, unspecified: Secondary | ICD-10-CM | POA: Diagnosis not present

## 2018-04-13 DIAGNOSIS — I10 Essential (primary) hypertension: Secondary | ICD-10-CM | POA: Diagnosis not present

## 2018-04-13 DIAGNOSIS — F329 Major depressive disorder, single episode, unspecified: Secondary | ICD-10-CM | POA: Diagnosis not present

## 2018-04-19 DIAGNOSIS — E43 Unspecified severe protein-calorie malnutrition: Secondary | ICD-10-CM | POA: Diagnosis not present

## 2018-04-19 DIAGNOSIS — K219 Gastro-esophageal reflux disease without esophagitis: Secondary | ICD-10-CM | POA: Diagnosis not present

## 2018-04-19 DIAGNOSIS — I10 Essential (primary) hypertension: Secondary | ICD-10-CM | POA: Diagnosis not present

## 2018-04-19 DIAGNOSIS — F329 Major depressive disorder, single episode, unspecified: Secondary | ICD-10-CM | POA: Diagnosis not present

## 2021-09-01 ENCOUNTER — Emergency Department (HOSPITAL_COMMUNITY): Payer: Medicare HMO

## 2021-09-01 ENCOUNTER — Other Ambulatory Visit: Payer: Self-pay

## 2021-09-01 ENCOUNTER — Encounter (HOSPITAL_COMMUNITY): Payer: Self-pay | Admitting: Emergency Medicine

## 2021-09-01 ENCOUNTER — Emergency Department (HOSPITAL_COMMUNITY)
Admission: EM | Admit: 2021-09-01 | Discharge: 2021-09-01 | Disposition: A | Discharge status: Skilled Nursing Facility | Payer: Medicare HMO | Attending: Emergency Medicine | Admitting: Emergency Medicine

## 2021-09-01 DIAGNOSIS — W19XXXA Unspecified fall, initial encounter: Secondary | ICD-10-CM

## 2021-09-01 DIAGNOSIS — I2510 Atherosclerotic heart disease of native coronary artery without angina pectoris: Secondary | ICD-10-CM | POA: Insufficient documentation

## 2021-09-01 DIAGNOSIS — W01198A Fall on same level from slipping, tripping and stumbling with subsequent striking against other object, initial encounter: Secondary | ICD-10-CM | POA: Diagnosis not present

## 2021-09-01 DIAGNOSIS — R0789 Other chest pain: Secondary | ICD-10-CM | POA: Insufficient documentation

## 2021-09-01 DIAGNOSIS — I509 Heart failure, unspecified: Secondary | ICD-10-CM | POA: Insufficient documentation

## 2021-09-01 DIAGNOSIS — R109 Unspecified abdominal pain: Secondary | ICD-10-CM | POA: Insufficient documentation

## 2021-09-01 DIAGNOSIS — Z79899 Other long term (current) drug therapy: Secondary | ICD-10-CM | POA: Insufficient documentation

## 2021-09-01 DIAGNOSIS — S0990XA Unspecified injury of head, initial encounter: Secondary | ICD-10-CM | POA: Diagnosis present

## 2021-09-01 DIAGNOSIS — E039 Hypothyroidism, unspecified: Secondary | ICD-10-CM | POA: Insufficient documentation

## 2021-09-01 DIAGNOSIS — I110 Hypertensive heart disease with heart failure: Secondary | ICD-10-CM | POA: Diagnosis not present

## 2021-09-01 LAB — COMPREHENSIVE METABOLIC PANEL
ALT: 12 U/L (ref 0–44)
AST: 20 U/L (ref 15–41)
Albumin: 3.4 g/dL — ABNORMAL LOW (ref 3.5–5.0)
Alkaline Phosphatase: 89 U/L (ref 38–126)
Anion gap: 4 — ABNORMAL LOW (ref 5–15)
BUN: 12 mg/dL (ref 8–23)
CO2: 26 mmol/L (ref 22–32)
Calcium: 8.1 mg/dL — ABNORMAL LOW (ref 8.9–10.3)
Chloride: 106 mmol/L (ref 98–111)
Creatinine, Ser: 0.76 mg/dL (ref 0.44–1.00)
GFR, Estimated: >60 mL/min (ref >60)
Glucose, Bld: 132 mg/dL — ABNORMAL HIGH (ref 70–99)
Potassium: 3.6 mmol/L (ref 3.5–5.1)
Sodium: 136 mmol/L (ref 135–145)
Total Bilirubin: 1.6 mg/dL — ABNORMAL HIGH (ref 0.3–1.2)
Total Protein: 7.2 g/dL (ref 6.5–8.1)

## 2021-09-01 LAB — CBC
HCT: 42.1 % (ref 36.0–46.0)
Hemoglobin: 14 g/dL (ref 12.0–15.0)
MCH: 31.5 pg (ref 26.0–34.0)
MCHC: 33.3 g/dL (ref 30.0–36.0)
MCV: 94.8 fL (ref 80.0–100.0)
Platelets: 231 10*3/uL (ref 150–400)
RBC: 4.44 MIL/uL (ref 3.87–5.11)
RDW: 13.6 % (ref 11.5–15.5)
WBC: 6.3 10*3/uL (ref 4.0–10.5)
nRBC: 0 % (ref 0.0–0.2)

## 2021-09-01 LAB — I-STAT CHEM 8, ED
BUN: 12 mg/dL (ref 8–23)
Calcium, Ion: 1.09 mmol/L — ABNORMAL LOW (ref 1.15–1.40)
Chloride: 103 mmol/L (ref 98–111)
Creatinine, Ser: 0.6 mg/dL (ref 0.44–1.00)
Glucose, Bld: 130 mg/dL — ABNORMAL HIGH (ref 70–99)
HCT: 43 % (ref 36.0–46.0)
Hemoglobin: 14.6 g/dL (ref 12.0–15.0)
Potassium: 3.8 mmol/L (ref 3.5–5.1)
Sodium: 140 mmol/L (ref 135–145)
TCO2: 25 mmol/L (ref 22–32)

## 2021-09-01 LAB — PROTIME-INR
INR: 1.2 (ref 0.8–1.2)
Prothrombin Time: 15.5 seconds — ABNORMAL HIGH (ref 11.4–15.2)

## 2021-09-01 LAB — TROPONIN I (HIGH SENSITIVITY)
Troponin I (High Sensitivity): 10 ng/L (ref <18)
Troponin I (High Sensitivity): 10 ng/L (ref <18)

## 2021-09-01 LAB — MAGNESIUM: Magnesium: 1.8 mg/dL (ref 1.7–2.4)

## 2021-09-01 MED ORDER — METHOCARBAMOL 500 MG PO TABS: 500.0000 mg | ORAL_TABLET | Freq: Two times a day (BID) | ORAL | 0 refills | Status: AC | PRN

## 2021-09-01 MED ORDER — FENTANYL CITRATE PF 50 MCG/ML IJ SOSY: 25.0000 ug | PREFILLED_SYRINGE | INTRAMUSCULAR | Status: DC | PRN

## 2021-09-01 MED ORDER — ACETAMINOPHEN ER 650 MG PO TBCR: 650.0000 mg | EXTENDED_RELEASE_TABLET | Freq: Three times a day (TID) | ORAL | 0 refills | Status: AC

## 2021-09-01 MED ORDER — LIDOCAINE 4 % EX PTCH
1.0000 | MEDICATED_PATCH | CUTANEOUS | 0 refills | Status: AC
Start: 2021-09-01 — End: 2021-09-06

## 2021-09-01 MED ORDER — LACTATED RINGERS IV BOLUS
500.0000 mL | Freq: Once | INTRAVENOUS | Status: AC
  Administered 2021-09-01: 500 mL via INTRAVENOUS

## 2021-09-01 MED ORDER — LIDOCAINE 5 % EX PTCH
1.0000 | MEDICATED_PATCH | CUTANEOUS | Status: DC
  Administered 2021-09-01: 1 via TRANSDERMAL
  Filled 2021-09-01: qty 1

## 2021-09-01 MED ORDER — IOHEXOL 300 MG/ML  SOLN
100.0000 mL | Freq: Once | INTRAMUSCULAR | Status: AC | PRN
  Administered 2021-09-01: 80 mL via INTRAVENOUS

## 2021-09-01 MED ORDER — FENTANYL CITRATE PF 50 MCG/ML IJ SOSY
25.0000 ug | PREFILLED_SYRINGE | Freq: Once | INTRAMUSCULAR | Status: AC
  Administered 2021-09-01: 25 ug via INTRAVENOUS
  Filled 2021-09-01: qty 1

## 2021-09-01 MED ORDER — KETOROLAC TROMETHAMINE 15 MG/ML IJ SOLN
15.0000 mg | Freq: Once | INTRAMUSCULAR | Status: AC
  Administered 2021-09-01: 15 mg via INTRAVENOUS
  Filled 2021-09-01: qty 1

## 2021-09-01 MED ORDER — METHOCARBAMOL 500 MG PO TABS
500.0000 mg | ORAL_TABLET | Freq: Once | ORAL | Status: AC
  Administered 2021-09-01: 500 mg via ORAL
  Filled 2021-09-01: qty 1

## 2021-09-01 NOTE — ED Provider Notes (Signed)
[facility] EMERGENCY DEPARTMENT Provider Note   CSN: [id number] Arrival date & time: 09/01/21  0757     History  Chief Complaint  Patient presents with   Lytle Michaels    Brittany Wallace is a 88 y.o. female.  HPI Patient presents after a fall.  Medical history includes aortic stenosis, HLD, HTN, hypothyroidism, LBBB, CAD, GERD, CHF.  She resides at [facility] in [place].  EMS was not able to provide details of the fall.  It is unclear if it was witnessed.  She is reportedly ambulatory at baseline. Patient is endorsing right-sided chest wall pain.  On review of nursing facility paperwork, does not appear that she is on a blood thinner.    History per nursing facility staff: Patient had a fall this morning that was witnessed by her roommate.  Patient is remained is reportedly alert and oriented and was able to describe the fall.  Fall was described as the patient falling backwards and striking the back of her head.  When she was on the floor, she was unable to get up off of the floor.  At baseline, patient is alert and oriented but very hard of hearing.  She is able to ambulate with a walker at baseline.  Estimated time of the fall was 7:30 AM.      Home Medications Prior to Admission medications   Medication Sig Start Date End Date Taking? Authorizing Provider  acetaminophen (TYLENOL 8 HOUR) 650 MG CR tablet Take 1 tablet (650 mg total) by mouth every 8 (eight) hours for 5 days. 09/01/21 09/06/21 Yes Godfrey Pick, MD  amLODipine (NORVASC) 5 MG tablet TAKE (1) TABLET BY MOUTH ONCE DAILY. 03/03/16  Yes Troy Sine, MD  lidocaine (HM LIDOCAINE PATCH) 4 % Place 1 patch onto the skin daily for 5 days. 09/01/21 09/06/21 Yes Godfrey Pick, MD  melatonin 5 MG TABS Take 5 mg by mouth at bedtime.   Yes [provider]  memantine (NAMENDA) 10 MG tablet Take 10 mg by mouth 2 (two) times daily. 08/05/21  Yes [provider]  methocarbamol (ROBAXIN) 500 MG tablet Take 1 tablet (500 mg total)  by mouth 2 (two) times daily as needed for up to 5 days for muscle spasms. 09/01/21 09/06/21 Yes Godfrey Pick, MD  mirtazapine (REMERON) 7.5 MG tablet Take 7.5 mg by mouth at bedtime. 08/30/21  Yes [provider]  omeprazole (PRILOSEC) 20 MG capsule Take 20 mg by mouth daily.   Yes [provider]  timolol (TIMOPTIC) 0.5 % ophthalmic solution Place 1 drop into both eyes 2 (two) times daily. 07/22/21  Yes [provider]  venlafaxine XR (EFFEXOR-XR) 75 MG 24 hr capsule Take 75 mg by mouth daily. 08/10/21  Yes [provider]  vitamin B-12 (CYANOCOBALAMIN) 1000 MCG tablet Take 1,000 mcg by mouth daily.   Yes [provider]  Vitamin D, Ergocalciferol, (DRISDOL) 1.25 MG (50000 UNIT) CAPS capsule Take 50,000 Units by mouth every 7 (seven) days. Thursday   Yes [provider]  carvedilol (COREG) 3.125 MG tablet TAKE (1) TABLET BY MOUTH TWICE DAILY. Patient not taking: Reported on 09/01/2021 07/20/16   Troy Sine, MD  furosemide (LASIX) 20 MG tablet Take 0.5 tablets (10 mg total) by mouth daily. Patient not taking: Reported on 09/01/2021 08/23/15   Troy Sine, MD  lansoprazole (PREVACID) 30 MG capsule TAKE 1 CAPSULE 2 TIMES DAILY BEFORE A MEAL AND OTHER MEDICATIONS. Patient not taking: Reported on 09/01/2021 07/21/16   Rehman,  Mechele Dawley, MD      Allergies    Aspirin, Hytrin [terazosin], Levaquin [levofloxacin in d5w], and Pantoprazole    Review of Systems   Review of Systems  Cardiovascular:  Positive for chest pain (Right chest wall).  All other systems reviewed and are negative.   Physical Exam Updated Vital Signs BP (!) 148/77   Pulse 88   Temp 98.6 F (37 C) (Oral)   Resp 20   Ht '5\' 4"'$  (1.626 m)   Wt 45.8 kg   SpO2 92%   BMI 17.33 kg/m  Physical Exam Vitals and nursing note reviewed.  Constitutional:      General: She is not in acute distress.    Appearance: She is well-developed. She is ill-appearing. She is not toxic-appearing  or diaphoretic.  HENT:     Head: Normocephalic and atraumatic.     Right Ear: External ear normal.     Left Ear: External ear normal.     Nose: Nose normal.     Mouth/Throat:     Mouth: Mucous membranes are moist.     Pharynx: Oropharynx is clear.  Eyes:     Extraocular Movements: Extraocular movements intact.     Conjunctiva/sclera: Conjunctivae normal.  Cardiovascular:     Rate and Rhythm: Normal rate and regular rhythm.     Heart sounds: No murmur heard. Pulmonary:     Effort: Pulmonary effort is normal. No respiratory distress.     Breath sounds: Normal breath sounds. No wheezing or rales.  Chest:     Chest wall: Tenderness present.  Abdominal:     Palpations: Abdomen is soft.     Tenderness: There is no abdominal tenderness.  Musculoskeletal:        General: No swelling. Normal range of motion.     Cervical back: Normal range of motion and neck supple. No rigidity.     Right lower leg: No edema.     Left lower leg: No edema.  Skin:    General: Skin is warm and dry.     Capillary Refill: Capillary refill takes less than 2 seconds.     Coloration: Skin is not jaundiced or pale.  Neurological:     General: No focal deficit present.     Mental Status: She is alert.  Psychiatric:        Mood and Affect: Mood normal.        Behavior: Behavior normal.     ED Results / Procedures / Treatments   Labs (all labs ordered are listed, but only abnormal results are displayed) Labs Reviewed  COMPREHENSIVE METABOLIC PANEL - Abnormal; Notable for the following components:      Result Value   Glucose, Bld 132 (*)    Calcium 8.1 (*)    Albumin 3.4 (*)    Total Bilirubin 1.6 (*)    Anion gap 4 (*)    All other components within normal limits  PROTIME-INR - Abnormal; Notable for the following components:   Prothrombin Time 15.5 (*)    All other components within normal limits  I-STAT CHEM 8, ED - Abnormal; Notable for the following components:   Glucose, Bld 130 (*)     Calcium, Ion 1.09 (*)    All other components within normal limits  CBC  MAGNESIUM  URINALYSIS, ROUTINE W REFLEX MICROSCOPIC  TROPONIN I (HIGH SENSITIVITY)  TROPONIN I (HIGH SENSITIVITY)    EKG EKG Interpretation  Date/Time:  Monday September 01 2021 08:11:26 EDT Ventricular Rate:  76 PR Interval:  189 QRS Duration: 167 QT Interval:  458 QTC Calculation: 515 R Axis:   -71 Text Interpretation: Sinus rhythm LVH with IVCD, LAD and secondary repol abnrm Left bundle branch block Confirmed by Godfrey Pick [phone]) on 09/01/2021 9:19:49 AM  Radiology CT CHEST ABDOMEN PELVIS W CONTRAST  Result Date: 09/01/2021 CLINICAL DATA:  Fall. Blunt poly trauma. Right chest and abdominal pain. EXAM: CT CHEST, ABDOMEN, AND PELVIS WITH CONTRAST TECHNIQUE: Multidetector CT imaging of the chest, abdomen and pelvis was performed following the standard protocol during bolus administration of intravenous contrast. RADIATION DOSE REDUCTION: This exam was performed according to the departmental dose-optimization program which includes automated exposure control, adjustment of the mA and/or kV according to patient size and/or use of iterative reconstruction technique. CONTRAST:  64m OMNIPAQUE IOHEXOL 300 MG/ML  SOLN COMPARISON:  None Available. FINDINGS: CT CHEST FINDINGS Cardiovascular: No evidence of thoracic aortic injury or mediastinal hematoma. No pericardial effusion. 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 Mild-to-moderate cardiomegaly demonstrated. Aortic and coronary atherosclerotic calcification incidentally noted. Mediastinum/Nodes: No evidence of hemorrhage or pneumomediastinum. No masses or pathologically enlarged lymph nodes identified. Lungs/Pleura: Small low-attenuation right pleural effusion is seen. No evidence of pneumothorax. No evidence of pulmonary contusion or other infiltrate. Musculoskeletal: No acute fractures or suspicious bone lesions identified. CT ABDOMEN PELVIS FINDINGS Hepatobiliary: No hepatic  laceration or mass identified. Mild diffuse hepatic steatosis noted. Prior cholecystectomy. No evidence of biliary obstruction. Pancreas: No parenchymal laceration or peripancreatic fluid. A 1.2 cm simple appearing cystic lesion is seen in the pancreatic body on image 74/3. No evidence of pancreatic ductal dilatation. Spleen: No evidence of splenic laceration. Adrenal/Urinary Tract: No hemorrhage or parenchymal lacerations identified. No evidence of mass or hydronephrosis. Stomach/Bowel: Unopacified bowel loops are unremarkable in appearance. No evidence of hemoperitoneum. Vascular/Lymphatic: No evidence of abdominal aortic injury or retroperitoneal hemorrhage. Aortic atherosclerotic calcification incidentally noted. No pathologically enlarged lymph nodes identified. Reproductive: Prior hysterectomy noted. Adnexal regions are unremarkable in appearance. Other:  None. Musculoskeletal: No acute fractures or suspicious bone lesions identified. Old compression fracture deformity of the L2 vertebral body noted. IMPRESSION: Small low-attenuation right pleural effusion. No evidence of pneumothorax or other traumatic injury. 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 Recommend semi-annual imaging followup by CTA or MRA and referral to cardiothoracic surgery if not already obtained. This recommendation follows 2010 ACCF/AHA/AATS/ACR/ASA/SCA/SCAI/SIR/STS/SVM Guidelines for the Diagnosis and Management of Patients With Thoracic Aortic Disease. Circulation. 2010; 121:: U202-R42 Aortic aneurysm NOS (ICD10-I71.9) 1.2 cm simple appearing cystic lesion in pancreatic body. Differential diagnosis includes cystic neoplasm and pseudocyst. Recommend continued imaging follow-up by CT or MRI in 2 years. This recommendation follows ACR consensus guidelines: Management of Incidental Pancreatic Cysts: A White Paper of the ACR Incidental Findings Committee. [id number] Electronically Signed   By: JMarlaine HindM.D.   On: 09/01/2021  11:07   CT HEAD WO CONTRAST  Result Date: 09/01/2021 CLINICAL DATA:  Fall, struck back of head EXAM: CT HEAD WITHOUT CONTRAST CT CERVICAL SPINE WITHOUT CONTRAST TECHNIQUE: Multidetector CT imaging of the head and cervical spine was performed following the standard protocol without intravenous contrast. Multiplanar CT image reconstructions of the cervical spine were also generated. RADIATION DOSE REDUCTION: This exam was performed according to the departmental dose-optimization program which includes automated exposure control, adjustment of the mA and/or kV according to patient size and/or use of iterative reconstruction technique. COMPARISON:  None Available. FINDINGS: CT HEAD FINDINGS Brain: There is no acute intracranial hemorrhage,  extra-axial fluid collection, or evolved acute territorial infarct. Parenchymal volume is normal for age. The ventricles are normal in size. Hypodensity in the bilateral thalami are consistent with age-indeterminate infarcts. Additional patchy hypodensity in the subcortical and periventricular white matter likely reflects sequela of underlying chronic white matter microangiopathy. There is a probable calcified meningioma overlying the right frontal lobe measuring 9 mm with no mass effect on the underlying brain parenchyma. There is no other solid mass lesion. There is no mass effect or midline shift. Vascular: No hyperdense vessel or unexpected calcification. Skull: Normal. Negative for fracture or focal lesion. Sinuses/Orbits: There is complete opacification of the imaged right maxillary sinus with surrounding hyperostosis. There is partial opacification of posterior left ethmoid air cells and layering fluid in the left maxillary sinus. Bilateral lens implants and a left scleral buckle are noted. Other: None. CT CERVICAL SPINE FINDINGS Alignment: There is straightening of the normal cervical lordosis. There is trace anterolisthesis of C4 on C5, likely degenerative in nature.  There is no jumped or perched facets or other evidence of traumatic malalignment. Skull base and vertebrae: Skull base alignment is maintained. Vertebral body heights are preserved. There is no evidence of acute fracture. There is ankylosis of the facet joints at C2 through C4 and C5-C6. Is partial fusion of the C3 and C4 vertebral bodies. Soft tissues and spinal canal: No prevertebral fluid or swelling. No visible canal hematoma. Disc levels: There is disc space narrowing and degenerative endplate change most advanced at C5-C6 and C6-C7. Facet arthropathy is most advanced on the left at C4-C5. There is likely mild spinal canal stenosis C5-C6 and C6-C7 and severe left neural foraminal stenosis at C6-C7. Upper chest: There is scarring in the lung apices. Other: None. IMPRESSION: CT head: 1. No acute intracranial hemorrhage or calvarial fracture. 2. Hypodensities in the thalami likely reflect age-indeterminate infarcts. MRI may be considered for further evaluation if indicated. 3. Evidence of chronic sinusitis with layering fluid in the left maxillary sinus which could reflect superimposed acute sinusitis in the correct clinical setting. CT cervical spine: 1. No acute fracture or traumatic malalignment of the cervical spine. 2. Multilevel degenerative changes as above. Electronically Signed   By: Valetta Mole M.D.   On: 09/01/2021 10:59   CT CERVICAL SPINE WO CONTRAST  Result Date: 09/01/2021 CLINICAL DATA:  Fall, struck back of head EXAM: CT HEAD WITHOUT CONTRAST CT CERVICAL SPINE WITHOUT CONTRAST TECHNIQUE: Multidetector CT imaging of the head and cervical spine was performed following the standard protocol without intravenous contrast. Multiplanar CT image reconstructions of the cervical spine were also generated. RADIATION DOSE REDUCTION: This exam was performed according to the departmental dose-optimization program which includes automated exposure control, adjustment of the mA and/or kV according to patient  size and/or use of iterative reconstruction technique. COMPARISON:  None Available. FINDINGS: CT HEAD FINDINGS Brain: There is no acute intracranial hemorrhage, extra-axial fluid collection, or evolved acute territorial infarct. Parenchymal volume is normal for age. The ventricles are normal in size. Hypodensity in the bilateral thalami are consistent with age-indeterminate infarcts. Additional patchy hypodensity in the subcortical and periventricular white matter likely reflects sequela of underlying chronic white matter microangiopathy. There is a probable calcified meningioma overlying the right frontal lobe measuring 9 mm with no mass effect on the underlying brain parenchyma. There is no other solid mass lesion. There is no mass effect or midline shift. Vascular: No hyperdense vessel or unexpected calcification. Skull: Normal. Negative for fracture or focal lesion. Sinuses/Orbits: There is  complete opacification of the imaged right maxillary sinus with surrounding hyperostosis. There is partial opacification of posterior left ethmoid air cells and layering fluid in the left maxillary sinus. Bilateral lens implants and a left scleral buckle are noted. Other: None. CT CERVICAL SPINE FINDINGS Alignment: There is straightening of the normal cervical lordosis. There is trace anterolisthesis of C4 on C5, likely degenerative in nature. There is no jumped or perched facets or other evidence of traumatic malalignment. Skull base and vertebrae: Skull base alignment is maintained. Vertebral body heights are preserved. There is no evidence of acute fracture. There is ankylosis of the facet joints at C2 through C4 and C5-C6. Is partial fusion of the C3 and C4 vertebral bodies. Soft tissues and spinal canal: No prevertebral fluid or swelling. No visible canal hematoma. Disc levels: There is disc space narrowing and degenerative endplate change most advanced at C5-C6 and C6-C7. Facet arthropathy is most advanced on the left  at C4-C5. There is likely mild spinal canal stenosis C5-C6 and C6-C7 and severe left neural foraminal stenosis at C6-C7. Upper chest: There is scarring in the lung apices. Other: None. IMPRESSION: CT head: 1. No acute intracranial hemorrhage or calvarial fracture. 2. Hypodensities in the thalami likely reflect age-indeterminate infarcts. MRI may be considered for further evaluation if indicated. 3. Evidence of chronic sinusitis with layering fluid in the left maxillary sinus which could reflect superimposed acute sinusitis in the correct clinical setting. CT cervical spine: 1. No acute fracture or traumatic malalignment of the cervical spine. 2. Multilevel degenerative changes as above. Electronically Signed   By: Valetta Mole M.D.   On: 09/01/2021 10:59   DG Pelvis Portable  Result Date: 09/01/2021 CLINICAL DATA:  Fall today. EXAM: PORTABLE PELVIS 1-2 VIEWS COMPARISON:  None Available. FINDINGS: There is no evidence of pelvic fracture or diastasis. No pelvic bone lesions are seen. IMPRESSION: Negative. Electronically Signed   By: Marijo Conception M.D.   On: 09/01/2021 10:12   DG Chest Port 1 View  Result Date: 09/01/2021 CLINICAL DATA:  88 year old female with history of trauma from a fall presenting with right-sided chest pain. EXAM: PORTABLE CHEST 1 VIEW COMPARISON:  Chest x-ray 11/28/2017. FINDINGS: Lung volumes are low. No consolidative airspace disease. No pleural effusions. No pneumothorax. No evidence of pulmonary edema. Heart size is upper limits of normal. The patient is rotated to the right on today's exam, resulting in distortion of the mediastinal contours and reduced diagnostic sensitivity and specificity for mediastinal pathology. Sclerotic calcifications in the thoracic aorta. IMPRESSION: 1. Low lung volumes without radiographic evidence of acute cardiopulmonary disease. 2. Aortic atherosclerosis. Electronically Signed   By: Vinnie Langton M.D.   On: 09/01/2021 10:10     Procedures Procedures    Medications Ordered in ED Medications  fentaNYL (SUBLIMAZE) injection 25 mcg (has no administration in time range)  lidocaine (LIDODERM) 5 % 1 patch (1 patch Transdermal Patch Applied 09/01/21 1242)  fentaNYL (SUBLIMAZE) injection 25 mcg (25 mcg Intravenous Given 09/01/21 0850)  lactated ringers bolus 500 mL (0 mLs Intravenous Stopped 09/01/21 0935)  iohexol (OMNIPAQUE) 300 MG/ML solution 100 mL (80 mLs Intravenous Contrast Given 09/01/21 1005)  ketorolac (TORADOL) 15 MG/ML injection 15 mg (15 mg Intravenous Given 09/01/21 1235)  methocarbamol (ROBAXIN) tablet 500 mg (500 mg Oral Given 09/01/21 1237)    ED Course/ Medical Decision Making/ A&P  Medical Decision Making Amount and/or Complexity of Data Reviewed Labs: ordered. Radiology: ordered.  Risk OTC drugs. Prescription drug management.   This patient presents to the ED for concern of fall, this involves an extensive number of treatment options, and is a complaint that carries with it a high risk of complications and morbidity.  The differential diagnosis includes acute injuries, syncope, CVA   Co morbidities that complicate the patient evaluation  aortic stenosis, HLD, HTN, hypothyroidism, LBBB, CAD, GERD, CHF   Additional history obtained:  Additional history obtained from nursing facility staff External records from outside source obtained and reviewed including EMR   Lab Tests:  I Ordered, and personally interpreted labs.  The pertinent results include: Normal findings   Imaging Studies ordered:  I ordered imaging studies including chest and pelvis x-rays, CT of head, cervical spine, chest, abdomen, and pelvis I independently visualized and interpreted imaging which showed no evidence of traumatic injury I agree with the radiologist interpretation   Cardiac Monitoring: / EKG:  The patient was maintained on a cardiac monitor.  I personally viewed and  interpreted the cardiac monitored which showed an underlying rhythm of: Sinus rhythm  Problem List / ED Course / Critical interventions / Medication management  Patient is a 88 year old female presenting with skilled nursing facility following a fall.  This will fall was witnessed by her roommate, who is reportedly alert and oriented.  Patient remains describes a fall backwards onto a hard surface, during which time, patient did strike her head.  Patient was unable to get up off of the floor.  She was assisted off of the floor and transported to the ED for evaluation of possible injuries.  On arrival in the ED, patient appears uncomfortable.  She is holding the right side of her lower ribs indicating pain in this area.  This area also appears to be tender on palpation.  History is limited by the patient's hearing loss, as well as her acute discomfort.  Fentanyl was ordered for analgesia.  Work-up was initiated.  CT imaging did not show any evidence of acute injuries.  Patient does have a trace low-attenuation right pleural effusion.  Low suspicion for clinically significant hemothorax, given that CT imaging was obtained 3 hours after the fall.  On reassessment, patient is resting comfortably.  She does continue to endorse pain in the right side of her chest wall.  Multimodal pain control was ordered.  On further reassessment, patient's pain is controlled.  She is stable for discharge at this time. I ordered medication including fentanyl, Toradol, Robaxin, lidocaine patch for analgesia Reevaluation of the patient after these medicines showed that the patient improved I have reviewed the patients home medicines and have made adjustments as needed   Social Determinants of Health:  Lives in nursing facility         Final Clinical Impression(s) / ED Diagnoses Final diagnoses:  Fall, initial encounter    Rx / DC Orders ED Discharge Orders          Ordered    lidocaine (HM LIDOCAINE PATCH) 4 %   Every 24 hours        09/01/21 1321    acetaminophen (TYLENOL 8 HOUR) 650 MG CR tablet  Every 8 hours        09/01/21 1321    methocarbamol (ROBAXIN) 500 MG tablet  2 times daily PRN        09/01/21 1321  Godfrey Pick, MD 09/01/21 [phone]

## 2021-09-01 NOTE — ED Triage Notes (Signed)
Pt presents via [facility] for fall at [facility] witnessed by roommate, pt fell back striking head, doesn't verbalized any pain, but pt is favoring right ribcage and grimacing.

## 2023-04-24 DEATH — deceased
